# Patient Record
Sex: Female | Born: 1975
Health system: Southern US, Community
[De-identification: ages and names within clinical notes are randomized; demographics above are authoritative.]

## PROBLEM LIST (undated history)

## (undated) DIAGNOSIS — E7800 Pure hypercholesterolemia, unspecified: Secondary | ICD-10-CM

## (undated) DIAGNOSIS — J189 Pneumonia, unspecified organism: Secondary | ICD-10-CM

## (undated) DIAGNOSIS — R002 Palpitations: Secondary | ICD-10-CM

## (undated) DIAGNOSIS — Z8489 Family history of other specified conditions: Secondary | ICD-10-CM

## (undated) DIAGNOSIS — K8020 Calculus of gallbladder without cholecystitis without obstruction: Secondary | ICD-10-CM

## (undated) DIAGNOSIS — R011 Cardiac murmur, unspecified: Secondary | ICD-10-CM

## (undated) DIAGNOSIS — R51 Headache: Secondary | ICD-10-CM

## (undated) DIAGNOSIS — Z87442 Personal history of urinary calculi: Secondary | ICD-10-CM

## (undated) DIAGNOSIS — K219 Gastro-esophageal reflux disease without esophagitis: Secondary | ICD-10-CM

## (undated) DIAGNOSIS — R569 Unspecified convulsions: Secondary | ICD-10-CM

## (undated) DIAGNOSIS — H7092 Unspecified mastoiditis, left ear: Secondary | ICD-10-CM

## (undated) HISTORY — PX: MULTIPLE TOOTH EXTRACTIONS: SHX2053

## (undated) HISTORY — PX: TONSILLECTOMY: SUR1361

---

## 1998-09-27 ENCOUNTER — Other Ambulatory Visit: Admission: RE | Admit: 1998-09-27 | Discharge: 1998-09-27 | Payer: Self-pay | Admitting: *Deleted

## 1998-11-19 ENCOUNTER — Inpatient Hospital Stay (HOSPITAL_COMMUNITY): Admission: AD | Admit: 1998-11-19 | Discharge: 1998-11-19 | Payer: Self-pay | Admitting: Obstetrics & Gynecology

## 1999-02-02 ENCOUNTER — Inpatient Hospital Stay (HOSPITAL_COMMUNITY): Admission: AD | Admit: 1999-02-02 | Discharge: 1999-02-02 | Payer: Self-pay | Admitting: *Deleted

## 1999-02-25 ENCOUNTER — Inpatient Hospital Stay (HOSPITAL_COMMUNITY): Admission: AD | Admit: 1999-02-25 | Discharge: 1999-02-25 | Payer: Self-pay | Admitting: Obstetrics and Gynecology

## 1999-02-27 ENCOUNTER — Inpatient Hospital Stay (HOSPITAL_COMMUNITY): Admission: AD | Admit: 1999-02-27 | Discharge: 1999-02-27 | Payer: Self-pay | Admitting: *Deleted

## 1999-02-27 ENCOUNTER — Encounter: Payer: Self-pay | Admitting: *Deleted

## 1999-03-26 ENCOUNTER — Inpatient Hospital Stay (HOSPITAL_COMMUNITY): Admission: AD | Admit: 1999-03-26 | Discharge: 1999-03-26 | Payer: Self-pay | Admitting: *Deleted

## 1999-04-15 ENCOUNTER — Inpatient Hospital Stay (HOSPITAL_COMMUNITY): Admission: AD | Admit: 1999-04-15 | Discharge: 1999-04-15 | Payer: Self-pay | Admitting: Obstetrics & Gynecology

## 1999-04-19 ENCOUNTER — Inpatient Hospital Stay (HOSPITAL_COMMUNITY): Admission: AD | Admit: 1999-04-19 | Discharge: 1999-04-19 | Payer: Self-pay | Admitting: Obstetrics and Gynecology

## 1999-04-22 ENCOUNTER — Inpatient Hospital Stay (HOSPITAL_COMMUNITY): Admission: AD | Admit: 1999-04-22 | Discharge: 1999-04-22 | Payer: Self-pay | Admitting: Obstetrics and Gynecology

## 1999-04-28 ENCOUNTER — Encounter (INDEPENDENT_AMBULATORY_CARE_PROVIDER_SITE_OTHER): Payer: Self-pay

## 1999-04-28 ENCOUNTER — Inpatient Hospital Stay (HOSPITAL_COMMUNITY): Admission: AD | Admit: 1999-04-28 | Discharge: 1999-04-30 | Payer: Self-pay | Admitting: Obstetrics & Gynecology

## 2002-03-20 ENCOUNTER — Other Ambulatory Visit: Admission: RE | Admit: 2002-03-20 | Discharge: 2002-03-20 | Payer: Self-pay | Admitting: Obstetrics and Gynecology

## 2003-01-25 ENCOUNTER — Inpatient Hospital Stay (HOSPITAL_COMMUNITY): Admission: AD | Admit: 2003-01-25 | Discharge: 2003-01-26 | Payer: Self-pay | Admitting: Obstetrics and Gynecology

## 2003-02-08 ENCOUNTER — Ambulatory Visit (HOSPITAL_COMMUNITY): Admission: RE | Admit: 2003-02-08 | Discharge: 2003-02-08 | Payer: Self-pay | Admitting: Obstetrics and Gynecology

## 2003-02-23 ENCOUNTER — Inpatient Hospital Stay (HOSPITAL_COMMUNITY): Admission: AD | Admit: 2003-02-23 | Discharge: 2003-02-23 | Payer: Self-pay | Admitting: Obstetrics and Gynecology

## 2003-04-11 ENCOUNTER — Inpatient Hospital Stay (HOSPITAL_COMMUNITY): Admission: AD | Admit: 2003-04-11 | Discharge: 2003-04-11 | Payer: Self-pay | Admitting: Obstetrics and Gynecology

## 2003-05-14 ENCOUNTER — Inpatient Hospital Stay (HOSPITAL_COMMUNITY): Admission: AD | Admit: 2003-05-14 | Discharge: 2003-05-14 | Payer: Self-pay | Admitting: Obstetrics and Gynecology

## 2003-05-15 ENCOUNTER — Inpatient Hospital Stay (HOSPITAL_COMMUNITY): Admission: AD | Admit: 2003-05-15 | Discharge: 2003-05-15 | Payer: Self-pay | Admitting: Obstetrics and Gynecology

## 2003-06-10 ENCOUNTER — Inpatient Hospital Stay (HOSPITAL_COMMUNITY): Admission: AD | Admit: 2003-06-10 | Discharge: 2003-06-10 | Payer: Self-pay | Admitting: Obstetrics and Gynecology

## 2003-06-14 ENCOUNTER — Encounter (INDEPENDENT_AMBULATORY_CARE_PROVIDER_SITE_OTHER): Payer: Self-pay | Admitting: Specialist

## 2003-06-14 ENCOUNTER — Inpatient Hospital Stay (HOSPITAL_COMMUNITY): Admission: AD | Admit: 2003-06-14 | Discharge: 2003-06-16 | Payer: Self-pay | Admitting: Obstetrics and Gynecology

## 2003-07-20 ENCOUNTER — Inpatient Hospital Stay (HOSPITAL_COMMUNITY): Admission: AD | Admit: 2003-07-20 | Discharge: 2003-07-20 | Payer: Self-pay | Admitting: Obstetrics and Gynecology

## 2003-07-24 ENCOUNTER — Other Ambulatory Visit: Admission: RE | Admit: 2003-07-24 | Discharge: 2003-07-24 | Payer: Self-pay | Admitting: Obstetrics and Gynecology

## 2004-01-28 ENCOUNTER — Emergency Department (HOSPITAL_COMMUNITY): Admission: EM | Admit: 2004-01-28 | Discharge: 2004-01-29 | Payer: Self-pay | Admitting: Emergency Medicine

## 2004-03-25 ENCOUNTER — Ambulatory Visit (HOSPITAL_COMMUNITY): Admission: RE | Admit: 2004-03-25 | Discharge: 2004-03-25 | Payer: Self-pay | Admitting: Neurology

## 2004-04-07 ENCOUNTER — Ambulatory Visit (HOSPITAL_COMMUNITY): Admission: RE | Admit: 2004-04-07 | Discharge: 2004-04-07 | Payer: Self-pay | Admitting: Neurology

## 2005-03-07 ENCOUNTER — Inpatient Hospital Stay (HOSPITAL_COMMUNITY): Admission: AD | Admit: 2005-03-07 | Discharge: 2005-03-07 | Payer: Self-pay | Admitting: Obstetrics & Gynecology

## 2005-12-25 ENCOUNTER — Other Ambulatory Visit: Admission: RE | Admit: 2005-12-25 | Discharge: 2005-12-25 | Payer: Self-pay | Admitting: Obstetrics and Gynecology

## 2006-06-18 ENCOUNTER — Inpatient Hospital Stay (HOSPITAL_COMMUNITY): Admission: AD | Admit: 2006-06-18 | Discharge: 2006-06-18 | Payer: Self-pay | Admitting: Obstetrics and Gynecology

## 2006-06-23 ENCOUNTER — Encounter: Admission: RE | Admit: 2006-06-23 | Discharge: 2006-06-23 | Payer: Self-pay | Admitting: Obstetrics and Gynecology

## 2006-07-28 ENCOUNTER — Inpatient Hospital Stay (HOSPITAL_COMMUNITY): Admission: AD | Admit: 2006-07-28 | Discharge: 2006-07-29 | Payer: Self-pay | Admitting: Obstetrics and Gynecology

## 2006-08-02 ENCOUNTER — Inpatient Hospital Stay (HOSPITAL_COMMUNITY): Admission: AD | Admit: 2006-08-02 | Discharge: 2006-08-02 | Payer: Self-pay | Admitting: Obstetrics and Gynecology

## 2006-08-24 ENCOUNTER — Inpatient Hospital Stay (HOSPITAL_COMMUNITY): Admission: AD | Admit: 2006-08-24 | Discharge: 2006-08-24 | Payer: Self-pay | Admitting: Obstetrics and Gynecology

## 2006-09-02 ENCOUNTER — Inpatient Hospital Stay (HOSPITAL_COMMUNITY): Admission: AD | Admit: 2006-09-02 | Discharge: 2006-09-02 | Payer: Self-pay | Admitting: Obstetrics and Gynecology

## 2006-09-17 ENCOUNTER — Inpatient Hospital Stay (HOSPITAL_COMMUNITY): Admission: AD | Admit: 2006-09-17 | Discharge: 2006-09-17 | Payer: Self-pay | Admitting: Obstetrics and Gynecology

## 2006-09-29 ENCOUNTER — Inpatient Hospital Stay (HOSPITAL_COMMUNITY): Admission: AD | Admit: 2006-09-29 | Discharge: 2006-09-29 | Payer: Self-pay | Admitting: Obstetrics and Gynecology

## 2006-10-22 ENCOUNTER — Inpatient Hospital Stay (HOSPITAL_COMMUNITY): Admission: AD | Admit: 2006-10-22 | Discharge: 2006-10-22 | Payer: Self-pay | Admitting: Obstetrics and Gynecology

## 2006-10-24 ENCOUNTER — Inpatient Hospital Stay (HOSPITAL_COMMUNITY): Admission: AD | Admit: 2006-10-24 | Discharge: 2006-10-25 | Payer: Self-pay | Admitting: Obstetrics and Gynecology

## 2006-10-25 ENCOUNTER — Inpatient Hospital Stay (HOSPITAL_COMMUNITY): Admission: AD | Admit: 2006-10-25 | Discharge: 2006-10-25 | Payer: Self-pay | Admitting: Obstetrics and Gynecology

## 2006-10-29 ENCOUNTER — Inpatient Hospital Stay (HOSPITAL_COMMUNITY): Admission: RE | Admit: 2006-10-29 | Discharge: 2006-10-31 | Payer: Self-pay | Admitting: Obstetrics and Gynecology

## 2007-08-06 ENCOUNTER — Emergency Department (HOSPITAL_COMMUNITY): Admission: EM | Admit: 2007-08-06 | Discharge: 2007-08-07 | Payer: Self-pay | Admitting: Emergency Medicine

## 2007-12-19 ENCOUNTER — Ambulatory Visit (HOSPITAL_COMMUNITY): Admission: RE | Admit: 2007-12-19 | Discharge: 2007-12-19 | Payer: Self-pay | Admitting: Family Medicine

## 2007-12-19 ENCOUNTER — Encounter: Payer: Self-pay | Admitting: Family Medicine

## 2007-12-19 ENCOUNTER — Ambulatory Visit: Payer: Self-pay | Admitting: Family Medicine

## 2007-12-19 DIAGNOSIS — F410 Panic disorder [episodic paroxysmal anxiety] without agoraphobia: Secondary | ICD-10-CM | POA: Insufficient documentation

## 2007-12-19 DIAGNOSIS — L509 Urticaria, unspecified: Secondary | ICD-10-CM

## 2007-12-19 DIAGNOSIS — R569 Unspecified convulsions: Secondary | ICD-10-CM | POA: Insufficient documentation

## 2007-12-19 LAB — CONVERTED CEMR LAB
AST: 9 units/L (ref 0–37)
Albumin: 4.1 g/dL (ref 3.5–5.2)
Alkaline Phosphatase: 84 units/L (ref 39–117)
Chloride: 105 meq/L (ref 96–112)
Glucose, Bld: 83 mg/dL (ref 70–99)
Hemoglobin: 13.4 g/dL (ref 12.0–15.0)
MCV: 82.3 fL (ref 78.0–100.0)
Platelets: 351 10*3/uL (ref 150–400)
Sodium: 141 meq/L (ref 135–145)
Total Protein: 7 g/dL (ref 6.0–8.3)
WBC: 15.7 10*3/uL — ABNORMAL HIGH (ref 4.0–10.5)

## 2007-12-20 DIAGNOSIS — K219 Gastro-esophageal reflux disease without esophagitis: Secondary | ICD-10-CM

## 2007-12-21 ENCOUNTER — Encounter: Payer: Self-pay | Admitting: Family Medicine

## 2008-01-27 ENCOUNTER — Ambulatory Visit: Payer: Self-pay | Admitting: Family Medicine

## 2008-01-27 DIAGNOSIS — E669 Obesity, unspecified: Secondary | ICD-10-CM | POA: Insufficient documentation

## 2008-02-28 ENCOUNTER — Ambulatory Visit: Payer: Self-pay | Admitting: Family Medicine

## 2008-02-28 ENCOUNTER — Encounter: Payer: Self-pay | Admitting: Family Medicine

## 2008-02-28 ENCOUNTER — Encounter (INDEPENDENT_AMBULATORY_CARE_PROVIDER_SITE_OTHER): Payer: Self-pay | Admitting: Family Medicine

## 2008-02-28 LAB — CONVERTED CEMR LAB: GC Probe Amp, Genital: NEGATIVE

## 2008-03-02 ENCOUNTER — Encounter: Payer: Self-pay | Admitting: Family Medicine

## 2008-03-02 ENCOUNTER — Telehealth: Payer: Self-pay | Admitting: Family Medicine

## 2008-03-30 ENCOUNTER — Encounter: Payer: Self-pay | Admitting: Family Medicine

## 2008-04-09 ENCOUNTER — Ambulatory Visit: Payer: Self-pay | Admitting: Family Medicine

## 2008-05-03 ENCOUNTER — Ambulatory Visit: Payer: Self-pay | Admitting: Family Medicine

## 2008-06-04 ENCOUNTER — Ambulatory Visit: Payer: Self-pay | Admitting: Family Medicine

## 2008-06-04 ENCOUNTER — Encounter: Payer: Self-pay | Admitting: Family Medicine

## 2008-06-05 ENCOUNTER — Encounter: Payer: Self-pay | Admitting: Family Medicine

## 2008-06-05 LAB — CONVERTED CEMR LAB
AST: 14 units/L (ref 0–37)
CO2: 21 meq/L (ref 19–32)
Chloride: 107 meq/L (ref 96–112)
Hemoglobin: 14.1 g/dL (ref 12.0–15.0)
LDL Cholesterol: 114 mg/dL — ABNORMAL HIGH (ref 0–99)
MCHC: 32.9 g/dL (ref 30.0–36.0)
MCV: 84.8 fL (ref 78.0–100.0)
Platelets: 318 10*3/uL (ref 150–400)
Potassium: 4.4 meq/L (ref 3.5–5.3)
RDW: 14.8 % (ref 11.5–15.5)
Sodium: 140 meq/L (ref 135–145)
Total Protein: 7.3 g/dL (ref 6.0–8.3)
Triglycerides: 208 mg/dL — ABNORMAL HIGH (ref <150)
VLDL: 42 mg/dL — ABNORMAL HIGH (ref 0–40)

## 2008-06-08 ENCOUNTER — Telehealth: Payer: Self-pay | Admitting: *Deleted

## 2008-07-02 ENCOUNTER — Ambulatory Visit: Payer: Self-pay | Admitting: Family Medicine

## 2008-07-02 ENCOUNTER — Encounter: Payer: Self-pay | Admitting: Family Medicine

## 2008-07-11 ENCOUNTER — Encounter (INDEPENDENT_AMBULATORY_CARE_PROVIDER_SITE_OTHER): Payer: Self-pay | Admitting: Family Medicine

## 2008-07-11 ENCOUNTER — Ambulatory Visit: Payer: Self-pay | Admitting: Cardiology

## 2008-07-11 ENCOUNTER — Ambulatory Visit (HOSPITAL_COMMUNITY): Admission: RE | Admit: 2008-07-11 | Discharge: 2008-07-11 | Payer: Self-pay | Admitting: Family Medicine

## 2008-07-12 ENCOUNTER — Telehealth: Payer: Self-pay | Admitting: *Deleted

## 2008-07-25 ENCOUNTER — Telehealth: Payer: Self-pay | Admitting: Family Medicine

## 2008-07-25 ENCOUNTER — Ambulatory Visit: Payer: Self-pay | Admitting: Family Medicine

## 2008-07-26 ENCOUNTER — Encounter: Admission: RE | Admit: 2008-07-26 | Discharge: 2008-07-26 | Payer: Self-pay | Admitting: Family Medicine

## 2008-07-26 ENCOUNTER — Ambulatory Visit: Payer: Self-pay | Admitting: Family Medicine

## 2008-07-30 ENCOUNTER — Ambulatory Visit: Payer: Self-pay | Admitting: Family Medicine

## 2008-07-30 DIAGNOSIS — J45909 Unspecified asthma, uncomplicated: Secondary | ICD-10-CM | POA: Insufficient documentation

## 2008-09-26 ENCOUNTER — Telehealth: Payer: Self-pay | Admitting: Family Medicine

## 2008-10-03 ENCOUNTER — Ambulatory Visit: Payer: Self-pay | Admitting: Family Medicine

## 2008-10-05 ENCOUNTER — Telehealth: Payer: Self-pay | Admitting: *Deleted

## 2008-10-17 ENCOUNTER — Ambulatory Visit: Payer: Self-pay | Admitting: Family Medicine

## 2008-10-17 ENCOUNTER — Telehealth: Payer: Self-pay | Admitting: Family Medicine

## 2008-10-21 ENCOUNTER — Emergency Department (HOSPITAL_COMMUNITY): Admission: EM | Admit: 2008-10-21 | Discharge: 2008-10-22 | Payer: Self-pay | Admitting: Emergency Medicine

## 2008-10-23 ENCOUNTER — Encounter: Payer: Self-pay | Admitting: Family Medicine

## 2008-11-03 ENCOUNTER — Encounter (INDEPENDENT_AMBULATORY_CARE_PROVIDER_SITE_OTHER): Payer: Self-pay | Admitting: *Deleted

## 2008-11-13 ENCOUNTER — Encounter: Payer: Self-pay | Admitting: Family Medicine

## 2008-11-13 ENCOUNTER — Ambulatory Visit: Payer: Self-pay | Admitting: Family Medicine

## 2008-11-13 LAB — CONVERTED CEMR LAB
BUN: 7 mg/dL (ref 6–23)
CO2: 24 meq/L (ref 19–32)
Calcium: 9.3 mg/dL (ref 8.4–10.5)
Chloride: 105 meq/L (ref 96–112)
Creatinine, Ser: 0.78 mg/dL (ref 0.40–1.20)
Glucose, Bld: 82 mg/dL (ref 70–99)
Phenytoin Lvl: <0.5 ug/mL — ABNORMAL LOW (ref 10.0–20.0)
Sodium: 142 meq/L (ref 135–145)

## 2008-11-14 ENCOUNTER — Encounter: Payer: Self-pay | Admitting: Family Medicine

## 2008-11-15 ENCOUNTER — Telehealth: Payer: Self-pay | Admitting: Family Medicine

## 2008-11-30 ENCOUNTER — Telehealth: Payer: Self-pay | Admitting: Family Medicine

## 2008-11-30 ENCOUNTER — Ambulatory Visit: Payer: Self-pay | Admitting: Family Medicine

## 2008-11-30 ENCOUNTER — Encounter: Payer: Self-pay | Admitting: Family Medicine

## 2008-12-04 ENCOUNTER — Encounter: Payer: Self-pay | Admitting: Family Medicine

## 2008-12-04 ENCOUNTER — Ambulatory Visit: Payer: Self-pay | Admitting: Family Medicine

## 2008-12-05 ENCOUNTER — Telehealth: Payer: Self-pay | Admitting: Sports Medicine

## 2008-12-06 ENCOUNTER — Telehealth: Payer: Self-pay | Admitting: Family Medicine

## 2008-12-10 ENCOUNTER — Encounter: Payer: Self-pay | Admitting: Family Medicine

## 2008-12-11 ENCOUNTER — Encounter: Payer: Self-pay | Admitting: Family Medicine

## 2008-12-26 ENCOUNTER — Ambulatory Visit: Payer: Self-pay | Admitting: Family Medicine

## 2008-12-31 ENCOUNTER — Telehealth: Payer: Self-pay | Admitting: *Deleted

## 2009-01-17 ENCOUNTER — Telehealth: Payer: Self-pay | Admitting: Family Medicine

## 2009-01-23 ENCOUNTER — Telehealth (INDEPENDENT_AMBULATORY_CARE_PROVIDER_SITE_OTHER): Payer: Self-pay | Admitting: *Deleted

## 2009-02-18 ENCOUNTER — Ambulatory Visit: Payer: Self-pay | Admitting: Family Medicine

## 2009-02-18 ENCOUNTER — Telehealth: Payer: Self-pay | Admitting: *Deleted

## 2009-02-20 ENCOUNTER — Emergency Department (HOSPITAL_COMMUNITY): Admission: EM | Admit: 2009-02-20 | Discharge: 2009-02-20 | Payer: Self-pay | Admitting: Internal Medicine

## 2009-03-04 ENCOUNTER — Telehealth: Payer: Self-pay | Admitting: *Deleted

## 2009-03-12 ENCOUNTER — Inpatient Hospital Stay (HOSPITAL_COMMUNITY): Admission: AD | Admit: 2009-03-12 | Discharge: 2009-03-12 | Payer: Self-pay | Admitting: Obstetrics and Gynecology

## 2009-03-26 ENCOUNTER — Telehealth: Payer: Self-pay | Admitting: Family Medicine

## 2009-04-03 ENCOUNTER — Telehealth: Payer: Self-pay | Admitting: Family Medicine

## 2009-04-22 ENCOUNTER — Telehealth: Payer: Self-pay | Admitting: Family Medicine

## 2009-05-09 ENCOUNTER — Encounter: Payer: Self-pay | Admitting: Family Medicine

## 2009-05-09 ENCOUNTER — Ambulatory Visit: Payer: Self-pay | Admitting: Family Medicine

## 2009-05-09 DIAGNOSIS — N920 Excessive and frequent menstruation with regular cycle: Secondary | ICD-10-CM | POA: Insufficient documentation

## 2009-05-09 DIAGNOSIS — F341 Dysthymic disorder: Secondary | ICD-10-CM | POA: Insufficient documentation

## 2009-05-09 DIAGNOSIS — G5600 Carpal tunnel syndrome, unspecified upper limb: Secondary | ICD-10-CM | POA: Insufficient documentation

## 2009-05-10 LAB — CONVERTED CEMR LAB
ALT: 16 units/L (ref 0–35)
AST: 17 units/L (ref 0–37)
Alkaline Phosphatase: 82 units/L (ref 39–117)
BUN: 8 mg/dL (ref 6–23)
CO2: 24 meq/L (ref 19–32)
Calcium: 10.1 mg/dL (ref 8.4–10.5)
Chloride: 101 meq/L (ref 96–112)
Creatinine, Ser: 0.78 mg/dL (ref 0.40–1.20)
Glucose, Bld: 119 mg/dL — ABNORMAL HIGH (ref 70–99)
HCT: 43.6 % (ref 36.0–46.0)
RBC: 5.16 M/uL — ABNORMAL HIGH (ref 3.87–5.11)
Total Bilirubin: 0.2 mg/dL — ABNORMAL LOW (ref 0.3–1.2)
Total Protein: 7.4 g/dL (ref 6.0–8.3)

## 2009-05-13 ENCOUNTER — Encounter: Payer: Self-pay | Admitting: *Deleted

## 2009-05-29 ENCOUNTER — Ambulatory Visit: Payer: Self-pay | Admitting: Family Medicine

## 2009-05-29 DIAGNOSIS — E739 Lactose intolerance, unspecified: Secondary | ICD-10-CM | POA: Insufficient documentation

## 2009-05-29 DIAGNOSIS — Z720 Tobacco use: Secondary | ICD-10-CM

## 2009-05-29 DIAGNOSIS — H60399 Other infective otitis externa, unspecified ear: Secondary | ICD-10-CM | POA: Insufficient documentation

## 2009-05-30 ENCOUNTER — Telehealth: Payer: Self-pay | Admitting: Family Medicine

## 2009-05-31 ENCOUNTER — Encounter: Payer: Self-pay | Admitting: Family Medicine

## 2009-06-04 ENCOUNTER — Encounter: Admission: RE | Admit: 2009-06-04 | Discharge: 2009-06-04 | Payer: Self-pay | Admitting: Family Medicine

## 2009-06-04 ENCOUNTER — Ambulatory Visit: Payer: Self-pay | Admitting: Family Medicine

## 2009-06-11 ENCOUNTER — Ambulatory Visit (HOSPITAL_COMMUNITY): Admission: RE | Admit: 2009-06-11 | Discharge: 2009-06-11 | Payer: Self-pay | Admitting: Family Medicine

## 2009-06-12 ENCOUNTER — Encounter: Payer: Self-pay | Admitting: Family Medicine

## 2009-06-14 ENCOUNTER — Ambulatory Visit: Payer: Self-pay | Admitting: Family Medicine

## 2009-06-19 ENCOUNTER — Encounter: Payer: Self-pay | Admitting: Family Medicine

## 2009-06-30 ENCOUNTER — Telehealth: Payer: Self-pay | Admitting: Family Medicine

## 2009-07-04 ENCOUNTER — Telehealth: Payer: Self-pay | Admitting: Family Medicine

## 2009-07-08 ENCOUNTER — Telehealth: Payer: Self-pay | Admitting: Family Medicine

## 2009-07-09 ENCOUNTER — Telehealth: Payer: Self-pay | Admitting: Family Medicine

## 2009-07-18 ENCOUNTER — Encounter: Payer: Self-pay | Admitting: *Deleted

## 2009-07-24 ENCOUNTER — Telehealth: Payer: Self-pay | Admitting: Family Medicine

## 2009-07-25 ENCOUNTER — Encounter: Payer: Self-pay | Admitting: Family Medicine

## 2009-07-31 ENCOUNTER — Ambulatory Visit: Payer: Self-pay | Admitting: Family Medicine

## 2009-07-31 ENCOUNTER — Encounter: Payer: Self-pay | Admitting: Family Medicine

## 2009-07-31 LAB — CONVERTED CEMR LAB
Albumin: 4.1 g/dL (ref 3.5–5.2)
Alkaline Phosphatase: 87 units/L (ref 39–117)
Creatinine, Ser: 0.73 mg/dL (ref 0.40–1.20)
HCT: 43.2 % (ref 36.0–46.0)
Hemoglobin: 14.1 g/dL (ref 12.0–15.0)
Platelets: 366 10*3/uL (ref 150–400)
Sodium: 138 meq/L (ref 135–145)
Total Bilirubin: 0.3 mg/dL (ref 0.3–1.2)

## 2009-08-13 ENCOUNTER — Telehealth: Payer: Self-pay | Admitting: Family Medicine

## 2009-08-21 ENCOUNTER — Encounter: Payer: Self-pay | Admitting: *Deleted

## 2009-09-04 ENCOUNTER — Ambulatory Visit: Payer: Self-pay | Admitting: Family Medicine

## 2009-09-04 DIAGNOSIS — J309 Allergic rhinitis, unspecified: Secondary | ICD-10-CM | POA: Insufficient documentation

## 2009-09-04 DIAGNOSIS — G4700 Insomnia, unspecified: Secondary | ICD-10-CM | POA: Insufficient documentation

## 2009-09-08 ENCOUNTER — Encounter: Payer: Self-pay | Admitting: Family Medicine

## 2009-11-07 ENCOUNTER — Encounter: Payer: Self-pay | Admitting: Family Medicine

## 2009-11-21 ENCOUNTER — Encounter: Payer: Self-pay | Admitting: *Deleted

## 2009-11-22 ENCOUNTER — Ambulatory Visit: Payer: Self-pay | Admitting: Family Medicine

## 2009-11-26 ENCOUNTER — Telehealth: Payer: Self-pay | Admitting: Family Medicine

## 2009-12-03 ENCOUNTER — Encounter: Payer: Self-pay | Admitting: *Deleted

## 2009-12-04 ENCOUNTER — Encounter: Payer: Self-pay | Admitting: *Deleted

## 2009-12-05 ENCOUNTER — Encounter: Payer: Self-pay | Admitting: *Deleted

## 2010-02-18 ENCOUNTER — Encounter
Admission: RE | Admit: 2010-02-18 | Discharge: 2010-02-18 | Payer: Self-pay | Source: Home / Self Care | Attending: Family Medicine | Admitting: Family Medicine

## 2010-03-18 NOTE — Miscellaneous (Signed)
Summary: no show appt  Clinical Lists Changes Ms. Koltz was scheduled and appt on 10/7 for 10/18 @ 10:00.  She called yesterday to confirm her appt for her and her husband.  She had her neice call today to inquire about her appt at 10:30.  Informed her neice I would have to speak with her.  Told patient her appt was at 10:00.  She said her card said 1:30.  Informed her that before I could reschedule her next appt I had to confer with Asst Dir.  She said that Dr. Gomez Cleverly told her that as long as she called the day of the appt she would not be considered a NO Show.  In told her that was not correct.  Saw in patient's chart that she was on suspension from 7/11 after hanging up with patient. Abundio Miu  December 03, 2009 10:43 AM      I very clearly told Ms. Offner that if she missed 1 more appointment she would be fired.  My understanding of a cancelled appointment is an appointment that is cancelled at least 24 hours prior to the appt time.  This is what I tell my patients and what I told Ms. Erling Conte.   If she cancelled her appt this morning and her appointment was this morning, I consider that a 'no show'. Maybe Olegario Messier can clarify?  Alvia Grove  The missed appt on 10/18 does constitute a DNKA.  Accoring to the Suspension letter she received on 08/21/09 she will be dismissed from this practice.  She has had a total of 10 no shows since January of this year and has received fair warning about this pattern.  Letter will be mailed today.  Dennison Nancy RN  December 04, 2009 2:38 PM

## 2010-03-18 NOTE — Assessment & Plan Note (Signed)
Summary: Chest cold and stomach virus/KF   Vital Signs:  Patient profile:   35 year old female Height:      63 inches Weight:      223 pounds BMI:     39.65 BSA:     2.03 O2 Sat:      98 % on Room air Temp:     97.7 degrees F Pulse rate:   65 / minute BP sitting:   125 / 80  Vitals Entered By: Jone Baseman CMA (February 18, 2009 4:21 PM)  O2 Flow:  Room air CC: cold S&S x 4 days Is Patient Diabetic? No Pain Assessment Patient in pain? yes     Location: all over Intensity: 10 Type: achy   Primary Care Provider:  Ancil Boozer  MD  CC:  cold S&S x 4 days.  History of Present Illness: URI Symptoms Onset: 4 days agao Description: cough fever achy all over runny nose Modifying factors: no better with over the counter cough medicine   Symptoms Nasal discharge: Y Fever: mild Sore throat: Y Cough: Y Wheezing: N Ear pain: N GI symptoms: vomited early AM and a few times yesterday. last urine was a few hours ago.  No significant abdominal pain  Sick contacts: Y  Red Flags  Stiff neck: N Dyspnea: N Rash: N Swallowing difficulty: N  Sinusitis Risk Factors Headache/face pain: mild Double sickening: N tooth pain: N:   Flu Risk Factors Headache: Y muscle aches: Y severe fatigue: moderate  ROS - as above PMH - Medications reviewed and updated in medication list.  Smoking Status noted in VS form    Current Medications (verified): 1)  Omeprazole 40 Mg Cpdr (Omeprazole) .Marland Kitchen.. 1 By Mouth Two Times A Day For Reflux 2)  Hydrocodone-Acetaminophen 10-325 Mg Tabs (Hydrocodone-Acetaminophen) .Marland Kitchen.. 1 By Mouth Q6hr As Needed Severe Pain.  Do Not Fill Until 02/03/09 3)  Alprazolam 1 Mg Tabs (Alprazolam) .Marland Kitchen.. 1 By Mouth Three Times A Day As Needed For Anxiety.  Do Not Fill Until 01/04/09 4)  Dilantin 100 Mg Caps (Phenytoin Sodium Extended) .... Unknown Dose Per Wfu - She Thinks 800 Mg Daily. 5)  Hydroxyzine Hcl 10 Mg/32ml Syrp (Hydroxyzine Hcl) .... Take 2.5 Tsp By Mouth 4x  Daily As Needed.  Disp Disp 250cc 6)  Ventolin Hfa 108 (90 Base) Mcg/act Aers (Albuterol Sulfate) .... 2 Puffs Q4-6 Hrs As Needed 7)  Flonase 50 Mcg/act Susp (Fluticasone Propionate) .... 2 Sprays Each Nostril Daily For Sinus Congestion 8)  Flovent Hfa 110 Mcg/act Aero (Fluticasone Propionate  Hfa) .Marland Kitchen.. 1 Puff Two Times A Day For Asthma Control 9)  Naproxen 500 Mg Tabs (Naproxen) .Marland Kitchen.. 1 By Mouth Two Times A Day As Needed Back Pain 10)  Keppra 500 Mg Tabs (Levetiracetam) .Marland Kitchen.. 1 By Mouth Two Times A Day For Seizures 11)  Hydrocodone-Acetaminophen 10-325 Mg Tabs (Hydrocodone-Acetaminophen) .Marland Kitchen.. 1 By Mouth Q6hrs As Needed Severe Pain. Do Not Fill Until 01/04/09 12)  Alprazolam 1 Mg Tabs (Alprazolam) .Marland Kitchen.. 1 By Mouth Three Times A Day As Needed Anxiety.  Do Not Fill Until 02/03/09 13)  Promethazine Hcl 25 Mg Tabs (Promethazine Hcl) .Marland Kitchen.. 1 By Mouth Three Times A Day As Needed For Nausea  Allergies: No Known Drug Allergies  Physical Exam  General:  awake alert no acute distress mildy ill Ears:  clear Nose:  turbinates swollen clear rhinnorhea Mouth:  Oral mucosa and oropharynx without lesions or exudates. MM moist Neck:  No deformities, masses, or tenderness noted. Lungs:  Normal respiratory effort, chest expands symmetrically. Lungs are clear to auscultation, no crackles or wheezes. Heart:  Normal rate and regular rhythm. S1 and S2 normal without gallop, murmur, click, rub or other extra sounds. Abdomen:  Bowel sounds positive,abdomen soft and non-tender without masses, organomegaly or hernias noted. obese Msk:  No deformity or scoliosis noted of thoracic or lumbar spine.   Skin:  Intact without suspicious lesions or rashes   Impression & Recommendations:  Problem # 1:  URI (ICD-465.9) Assessment New  no signs of bacterial infection (PNA or urine or sinusitis)  Likely viral.  Will treat with phenergan for nausea.    Her updated medication list for this problem includes:    Promethazine  Hcl 25 Mg Tabs (Promethazine hcl) .Marland Kitchen... 1 by mouth three times a day as needed for nausea  Orders: [facility]- Est Level  3 (99213)  Complete Medication List: 1)  Omeprazole 40 Mg Cpdr (Omeprazole) .Marland Kitchen.. 1 by mouth two times a day for reflux 2)  Hydrocodone-acetaminophen 10-325 Mg Tabs (Hydrocodone-acetaminophen) .Marland Kitchen.. 1 by mouth q6hr as needed severe pain.  do not fill until 02/03/09 3)  Alprazolam 1 Mg Tabs (Alprazolam) .Marland Kitchen.. 1 by mouth three times a day as needed for anxiety.  do not fill until 01/04/09 4)  Dilantin 100 Mg Caps (Phenytoin sodium extended) .... Unknown dose per wfu - she thinks 800 mg daily. 5)  Hydroxyzine Hcl 10 Mg/70ml Syrp (Hydroxyzine hcl) .... Take 2.5 tsp by mouth 4x daily as needed.  disp disp 250cc 6)  Ventolin Hfa 108 (90 Base) Mcg/act Aers (Albuterol sulfate) .... 2 puffs q4-6 hrs as needed 7)  Flonase 50 Mcg/act Susp (Fluticasone propionate) .... 2 sprays each nostril daily for sinus congestion 8)  Flovent Hfa 110 Mcg/act Aero (Fluticasone propionate  hfa) .Marland Kitchen.. 1 puff two times a day for asthma control 9)  Naproxen 500 Mg Tabs (Naproxen) .Marland Kitchen.. 1 by mouth two times a day as needed back pain 10)  Keppra 500 Mg Tabs (Levetiracetam) .Marland Kitchen.. 1 by mouth two times a day for seizures 11)  Hydrocodone-acetaminophen 10-325 Mg Tabs (Hydrocodone-acetaminophen) .Marland Kitchen.. 1 by mouth q6hrs as needed severe pain. do not fill until 01/04/09 12)  Alprazolam 1 Mg Tabs (Alprazolam) .Marland Kitchen.. 1 by mouth three times a day as needed anxiety.  do not fill until 02/03/09 13)  Promethazine Hcl 25 Mg Tabs (Promethazine hcl) .Marland Kitchen.. 1 by mouth three times a day as needed for nausea  Other Orders: Pulse Oximetry- [facility] [id number])  Patient Instructions: 1)  follow up with Dr Sandi Mealy as needed 2)  Use the phernegan as needed for vomiting 3)  Drink small sips of Gatorade or Ginger ale every 5-10 min 4)  Get Afrin nasal spray two times a day for the next 3 days only  5)  If you are not better in one week or if the fevers get  higher than 102 or if you are not urinating for 12 hours then call us Prescriptions: PROMETHAZINE HCL 25 MG TABS (PROMETHAZINE HCL) 1 by mouth three times a day as needed for nausea  #15 x 0   Entered and Authorized by:   Pearlean Brownie MD   Signed by:   Pearlean Brownie MD on 02/18/2009   Method used:   [facility] to        [facility]  [address] [id number]       [address]       [place], [place]  [address]  Ph: [phone] or [phone]       Fax: [phone]   RxID:   [id number]

## 2010-03-18 NOTE — Consult Note (Signed)
Summary: [facility]  [facility]   Imported By: Clydell Hakim 11/13/2009 10:52:59  _____________________________________________________________________  External Attachment:    Type:   Image     Comment:   External Document

## 2010-03-18 NOTE — Progress Notes (Signed)
Summary: referral  Phone Note Call from Patient Call back at [phone]   Caller: Patient Summary of Call: wants to go to derm and needs referral go to this one - [facility].  Initial call taken by: De Nurse,  April 03, 2009 2:36 PM  Follow-up for Phone Call        to Dr. Meredith Leeds covering md Follow-up by: Golden Circle RN,  April 03, 2009 4:26 PM  Additional Follow-up for Phone Call Additional follow up Details #1::        Unclear what referral is for. Needs to be seen for this to be done. Additional Follow-up by: Romero Belling MD,  April 03, 2009 5:42 PM    Additional Follow-up for Phone Call Additional follow up Details #2::    for cyst on eye (original referral 11/10) Follow-up by: De Nurse,  April 04, 2009 8:32 AM  Additional Follow-up for Phone Call Additional follow up Details #3:: Details for Additional Follow-up Action Taken: I've reviewed the notes, and we have made the appointment for her.  She needs to contact the dermatologist if she needs to modify the referral. Additional Follow-up by: Romero Belling MD,  April 04, 2009 9:39 AM  appointment scheduled with Dr. Leonie Man of [facility] for 03/3 02/2009 at 8:50 AM. appempted to call patient. message left to return call. Theresia Lo RN  April 04, 2009 2:07 PM

## 2010-03-18 NOTE — Progress Notes (Signed)
Summary: Rx Req  Phone Note Refill Request Call back at Home Phone [phone] Message from:  Patient  Refills Requested: Medication #1:  HYDROCODONE-ACETAMINOPHEN 10-325 MG TABS 1 by mouth q6hrs as needed severe pain. Do not fill until 01/04/09 [facility] [address]  Initial call taken by: Clydell Hakim,  Jul 09, 2009 2:06 PM  Follow-up for Phone Call        no, needs appt to discuss this. doesn't need refill until 07/29/09 [facility] Follow-up by: Ancil Boozer  MD,  Jul 09, 2009 2:29 PM  Additional Follow-up for Phone Call Additional follow up Details #1::        Informed patient of above, she states that she got last rx for #60 with directions to take it three times a day so now shes due, adamant that she is not abusing med. Would like to speak with MD.  Additional Follow-up by: Garen Grams LPN,  Jul 09, 2009 4:57 PM    Additional Follow-up for Phone Call Additional follow up Details #2::    patient is on pain contract as of 07/02/08  patient states that seizures have returned and she has taken another fall and this is why she is using more of her pain medication than typical.  advised her that this will be last fill until she has follow up with me to discuss frequent falls and return of seizures at next visit needs uds, dilantin level.  i am at least somewhat concerned her husband may be taking her pain medication and this is why she is running out earlier than is typical for her.  Follow-up by: Ancil Boozer  MD,  Jul 10, 2009 2:29 PM  New/Updated Medications: HYDROCODONE-ACETAMINOPHEN 10-325 MG TABS (HYDROCODONE-ACETAMINOPHEN) 1 by mouth q6hrs as needed severe pain.  okay to fill on 07/10/09 Prescriptions: HYDROCODONE-ACETAMINOPHEN 10-325 MG TABS (HYDROCODONE-ACETAMINOPHEN) 1 by mouth q6hrs as needed severe pain.  okay to fill on 07/10/09  #60 x 0   Entered and Authorized by:   Ancil Boozer  MD   Signed by:   Ancil Boozer  MD on 07/10/2009   Method  used:   Handwritten   RxID:   [id number]

## 2010-03-18 NOTE — Progress Notes (Signed)
Summary: refill  Phone Note Refill Request Call back at Home Phone [phone] Message from:  Patient  Refills Requested: Medication #1:  OMEPRAZOLE 40 MG CPDR 1 by mouth two times a day for reflux also needs a DM kit and insurance will pay for it - [facility] Meter [facility] Strips [facility] Lancets -  [facility]  Initial call taken by: De Nurse,  May 30, 2009 12:26 PM  Follow-up for Phone Call        she did not test positive for diabetes so she doesn't need to test her sugars at this time.  the omeprazole script was given to her yesterday at her appt. Follow-up by: Ancil Boozer  MD,  May 30, 2009 1:54 PM  Additional Follow-up for Phone Call Additional follow up Details #1::        notified. Additional Follow-up by: Theresia Lo RN,  May 30, 2009 4:19 PM

## 2010-03-18 NOTE — Letter (Signed)
Summary: Termination of Care Letter  [facility]  [address]   [place], [place] [address]   Phone: [phone]  Fax: [phone]    12/04/2009 MRN: [id number]  Dear Ms. Loughridge,  Please be advised that the [facility] will no longer be your healthcare [facility].  Accordingly, all further requests for appointments and office visits will be declined.  As such, you are not allowed on the premises of the [facility].  Should you have another physician who will take over your care, please advise [place] and we will forward a copy of your medical records.  If you do not know of another physician, we will assist you with a referral.  The termination of your relationship with this office is effective immediately.  In the next 30 days if you find that you have a need for medical care we will refer you to another [facility] for that care.  Thank you for your cooperation.  Sincerely,  [facility]

## 2010-03-18 NOTE — Progress Notes (Signed)
Summary: resutls  Phone Note Call from Patient Call back at Home Phone [phone]   Caller: Patient Summary of Call: pt is wanting results of labs Initial call taken by: De Nurse,  August 13, 2009 4:07 PM  Follow-up for Phone Call        LM Follow-up by: Golden Circle RN,  August 13, 2009 4:07 PM  Additional Follow-up for Phone Call Additional follow up Details #1::        told her wbc slightly elevated.told her usually indication of fighting an infection. she has a sore throat. appt made for 8:30 tomorrow as she has to bring her son in then for labs told her dilantin levels are low. told her i will send to md & call her if they want her to do anything different. taking the dilantin & keppra as ordered. may discuss at visit in am Additional Follow-up by: Golden Circle RN,  August 13, 2009 4:10 PM    Additional Follow-up for Phone Call Additional follow up Details #2::    i am not convinced she is taking dilantin as written. i am not concerned about slightly elevated WBC at this time.   regardless, nothing new to do at this time.  thanks. Follow-up by: Ancil Boozer  MD,  August 13, 2009 5:47 PM

## 2010-03-18 NOTE — Progress Notes (Signed)
Summary: ? Bronchitis vs Asthma exacerbation?  Pt called from: [phone] Feels like she is developing bronchitis, using Albuterol about 8 times a day, throat sore thinks it from coughing, taking cough drops, fever off and on in early am and night. Ran out of Qvar, using flonase. I suggested she go to the Urgent Care to be seen since I can not tell over the phone if this is an asthma exacerbation or bronchitis. Pt agrees. Jamie Brookes MD  Jun 30, 2009 10:38 AM

## 2010-03-18 NOTE — Miscellaneous (Signed)
Summary: refill request  received refill request for patient's alprazolam except under different name - Lisa Simpson.  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  She has received a refill already on 05/29/09 with 3 additional refills.  this one was denied and sent back to pharmacy with this information on it. Ancil Boozer  MD  Jun 19, 2009 1:20 PM  Clinical Lists Changes

## 2010-03-18 NOTE — Progress Notes (Signed)
Summary: refill  Phone Note Refill Request Call back at Home Phone [phone] Message from:  Patient  Refills Requested: Medication #1:  ALPRAZOLAM 1 MG TABS 1 by mouth three times a day as needed for anxiety.  do not fill until 01/04/09  Medication #2:  OMEPRAZOLE 40 MG CPDR 1 by mouth two times a day for reflux  Medication #3:  HYDROCODONE-ACETAMINOPHEN 10-325 MG TABS 1 by mouth q6hr as needed severe pain.  Do not fill until 02/03/09  Medication #4:  HYDROXYZINE HCL 10 MG/5ML SYRP take 2.5 tsp by mouth 4x daily as needed.  Disp disp 250cc ventolin Dilantin [facility]  Initial call taken by: De Nurse,  March 04, 2009 3:15 PM  Follow-up for Phone Call        to Dr. Constance Goltz who is covering for Dr. Sandi Mealy Follow-up by: Golden Circle RN,  March 04, 2009 3:24 PM  Additional Follow-up for Phone Call Additional follow up Details #1::        Medical record reviewed.  These are regularly filled medicines for this patient.  Will provide 1 month supply while PCP is out, but not for Dilantin as this is prescribed by another physician and dose is not recorded in our chart.  Vicodin and Xanax prescriptions at front desk.  Others sent to pharmacy.  To [facility] Team. Additional Follow-up by: Romero Belling MD,  March 05, 2009 1:28 PM    Additional Follow-up for Phone Call Additional follow up Details #2::    Pt informed of the above. Follow-up by: Jone Baseman CMA,  March 06, 2009 10:53 AM  Prescriptions: VENTOLIN HFA 108 (90 BASE) MCG/ACT AERS (ALBUTEROL SULFATE) 2 puffs q4-6 hrs as needed  #1 x 0   Entered and Authorized by:   Romero Belling MD   Signed by:   Romero Belling MD on 03/05/2009   Method used:   Electronically to        [facility]* (retail)       [address]       [place], [place]  [address]       Ph: [phone]       Fax: [phone]   RxID:   [id number] HYDROXYZINE HCL 10 MG/5ML SYRP (HYDROXYZINE HCL) take 2.5 tsp by mouth 4x daily as needed.  Disp  disp 250cc  #250 x 0   Entered and Authorized by:   Romero Belling MD   Signed by:   Romero Belling MD on 03/05/2009   Method used:   Electronically to        [facility]* (retail)       [address]       [place], [place]  [address]       Ph: [phone]       Fax: [phone]   RxID:   [id number] ALPRAZOLAM 1 MG TABS (ALPRAZOLAM) 1 by mouth three times a day as needed for anxiety.  do not fill until 01/04/09  #90 x 0   Entered and Authorized by:   Romero Belling MD   Signed by:   Romero Belling MD on 03/05/2009   Method used:   Print then Give to Patient   RxID:   [id number] HYDROCODONE-ACETAMINOPHEN 10-325 MG TABS (HYDROCODONE-ACETAMINOPHEN) 1 by mouth q6hr as needed severe pain.  Do not fill until 02/03/09  #60 x 0   Entered and Authorized by:   Romero Belling MD   Signed by:   Romero Belling MD on 03/05/2009   Method used:   Print then  Give to Patient   RxID:   [id number]

## 2010-03-18 NOTE — Assessment & Plan Note (Signed)
Summary: meet new doc,df   Vital Signs:  Patient profile:   35 year old female Height:      63 inches Weight:      213.3 pounds BMI:     37.92 Temp:     97.7 degrees F oral Pulse rate:   80 / minute BP sitting:   120 / 77  (left arm) Cuff size:   regular  Vitals Entered By: Garen Grams LPN (September 04, 2009 3:01 PM) CC: Meet New MD Is Patient Diabetic? No Pain Assessment Patient in pain? no        Primary Care Provider:  Alvia Grove DO  CC:  Meet New MD.  History of Present Illness: 35 yo female here for f/u of multiple medical issues and to meet new MD (My 1st visit with pt) 1. Seizures, hx of. : Very vauge; pt reports having seizures since she was a child and being on meds for a number of years.(compliance always being an issue). Suppose to f/u at [facility] in December. Pt reports most recent seizure in June 2011 while [place] the beach.  Described episode as twitching that started in her hands and that went thru her entire body, lasting less than 2 min.  No bowel or urinary incontience, no tounge biting.  Did feel tired after episode.  Pt describes this as her usual course.  Currently suppose to be taking Keppra and Dilantin; pt openly admits to non-compliance with these meds and admits to taking decreased doses of them when she actually does take them. She feels these meds make her gain weight and make her tired. Pt requests different medicine for seizure control or no medicine at all.  Also currently takes vicodin for pain that she feels is caused after her seizures.  2. Obesity: Main concern for pt today.  Again attributes seizure meds as cause for weight gain, thus her non-compliance.  Endorses exercise 3-4 times a week for about 1 hour at a time.  Eats 5-6 small meals a day.  Tried loosing weight with no success.  Cites hx of taking prescribed med for weight loss with good results.  Wondering if she could try this again. 3. Insomnia: Takes xanax for sleep.  Has difficulty  falling asleep, not staying asleep 4. Allergies: c/o itching eyes, scratchy thorat and congestion.  Endorses hx of allergies that has responded, in the past to claritin.  Habits & Providers  Alcohol-Tobacco-Diet     Alcohol drinks/day: 0     Tobacco Status: current     Tobacco Counseling: to quit use of tobacco products  Exercise-Depression-Behavior     Does Patient Exercise: yes     Exercise Counseling: to improve exercise regimen     Exercise (avg: min/session): 30-60     Times/week: 3  Current Problems (verified): 1)  Uri  (ICD-465.9) 2)  Tobacco Abuse  (ICD-305.1) 3)  Otitis Externa  (ICD-380.10) 4)  Impaired Glucose Tolerance  (ICD-271.3) 5)  Anxiety Depression  (ICD-300.4) 6)  Carpal Tunnel Syndrome, Bilateral  (ICD-354.0) 7)  Excessive Menstrual Bleeding  (ICD-626.2) 8)  Pelvic Pain  (ICD-789.09) 9)  Seizure Disorder  (ICD-780.39) 10)  Asthma, Persistent, Mild  (ICD-493.90) 11)  Heart Murmur, Systolic  (ICD-785.2) 12)  Chronic Pain Syndrome  (ICD-338.4) 13)  Back Pain  (ICD-724.5) 14)  Obesity, Unspecified  (ICD-278.00) 15)  Urticaria, Chronic  (ICD-708.8) 16)  Gerd  (ICD-530.81) 17)  Panic Attack  (ICD-300.01) 18)  Palpitations  (ICD-785.1)  Current Medications (verified):  1)  Omeprazole 20 Mg Cpdr (Omeprazole) .Marland Kitchen.. 1 By Mouth Two Times A Day For Reflux 2)  Dilantin 100 Mg Caps (Phenytoin Sodium Extended) .... 2 By Mouth Two Times A Day For Seizures. 3)  Hydroxyzine Hcl 10 Mg/47ml Syrp (Hydroxyzine Hcl) .... Take 2.5 Tsp By Mouth 4x Daily As Needed.  Disp Disp 250cc 4)  Ventolin Hfa 108 (90 Base) Mcg/act Aers (Albuterol Sulfate) .... 2 Puffs Q4-6 Hrs As Needed 5)  Qvar 80 Mcg/act Aers (Beclomethasone Dipropionate) .... 2 Puffs Two Times A Day For Asthma Maintenance 6)  Keppra 500 Mg Tabs (Levetiracetam) .Marland Kitchen.. 1 By Mouth Two Times A Day For Seizures 7)  Metformin Hcl 500 Mg Tabs (Metformin Hcl) .Marland Kitchen.. 1 By Mouth Two Times A Day 8)  Cortisporin 3.5-10000-1 Soln  (Neomycin-Polymyxin-Hc) .... 4 Dropps in Each Ear Three Times A Day For 10 Days.  Disp 1 Bottle. 9)  Topamax 25 Mg Tabs (Topiramate) .... Take 25mg  By Mouth Two Times A Day X 1 Week, Then Increase To 50mg  Two Times A Day X 1 Week, Then Increase To 100mg  By Mouth Two Times A Day 10)  Loratadine 10 Mg Tabs (Loratadine) .... Take 1 Pill By Mouth Daily For Allergies  Allergies (verified): No Known Drug Allergies  Past History:  Past Medical History: Last updated: 07/25/2009 GERD Seizure disorder - has been seen at [facility] and records in chart.  they are unsure if true seizure d/o. often reports severe pain after seizures requiring narcotics.  currently trying to get back in with [facility] neurology but reportely has an outstanding bill so seizure d/o has been managed at [facility] obesity mild asthma chronic pain - back.  on controlled substances contract chronic urticaria  anxiety/panic attacks requiring benzos tobacco abuse poor dentition IMPAIRED GLUCOSE TOLERANCE (ICD-271.3) CARPAL TUNNEL SYNDROME, BILATERAL (ICD-354.0) EXCESSIVE MENSTRUAL BLEEDING (ICD-626.2) HEART MURMUR, SYSTOLIC (ICD-785.2)  Past Surgical History: Last updated: 02/28/2008 planned tooth extraction 03/2008  Family History: Last updated: 07/25/2009 Mother (elizabeth Feltner)  father (eric Hainer) sister Efraim Kaufmann Stanley) brother (michael Whitwell) child with autism spectrum disorder (see individual charts in EMR)  Social History: Last updated: 07/25/2009 Works in a Education officer, 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 Smokes 1/2 ppd  Denies ETOH use reports she does exercise Dropped out of high school often moving in and out with multiple family members (see family history for names).  family members often report that Barbarajean is not truthful in her account of her medical problems.  Risk  Factors: Alcohol Use: 0 (09/04/2009) Exercise: yes (09/04/2009)  Risk Factors: Smoking Status: current (09/04/2009) Packs/Day: <0.25 (06/04/2009)  Social History: Smoking Status:  current Does Patient Exercise:  yes  Physical Exam  General:  Vs reviewed, alert, well-developed, well-nourished, and well-hydrated.   Lungs:  Normal respiratory effort, chest expands symmetrically. Lungs are clear to auscultation, no crackles or wheezes. Heart:  Normal rate and regular rhythm. S1 and S2 normal without gallop, murmur, click, rub or other extra sounds. Abdomen:  Bowel sounds positive,abdomen soft and non-tender without masses, organomegaly or hernias noted. obese Psych:  Oriented X3, good eye contact, not anxious appearing, not depressed appearing, not agitated, and not suicidal.     Impression & Recommendations:  Problem # 1:  OBESITY, UNSPECIFIED (ICD-278.00) Assessment Unchanged Multi factorial.  Discussed at legnth today with pt.  Pt feels keppra and dilantin caused weight gain and wants to stop them, we can  do this and transition to Topamax to see if this makes a difference or if it increases her energy.  Pt also agreeable to d/c vicodin and xanax as these certainly aren't aiding in weight loss.  Once her med regimen is stabalized, pt requests weight loss medication.  This MAY be something I am willing to consider for her in the future.   Explained to pt I will require baseline labs, EKG and weekly BP and HR to monitor her if we decide to go this route. Pt will be responsable for keeping all of her appointments, which includes nurse visits for vitals.  Again, discussed at legnth the multiple factors that promote weight loss, mainly diet and exercise.  Medicine MAY enhance this, but it will certainly not be a magic pill.  Pt voices understanding and would like to start with medication change and proceed from there.  Orders: [facility]- Est  Level 4 ([address])  Problem # 2:  SEIZURE DISORDER  (ICD-780.39) Assessment: Unchanged Pt with admitted non-compliance of meds.  Last dilantin level about 4 weeks ago was subtherapudic.  Pt not willing to continue with current meds as she feels they are causing her weight gain.  Discussed in depth risks of not taking meds as prescribed.  At this point these medicines are probably not making much of a difference from a seizure prevention standpoint as pt takes them sporadically and has documented sub-therapudic levels.   Pt has tried topamax before with good results, while this is not my first choice of meds for seizure prevention, this is a medicine that pt is willing to take, which is an improvement from prior regimen.   Will taper off dilantin and keppra and start topamax with goal dose of 200mg  by mouth two times a day.   Her updated medication list for this problem includes:    Dilantin 100 Mg Caps (Phenytoin sodium extended) .Marland Kitchen... 2 by mouth two times a day for seizures.    Keppra 500 Mg Tabs (Levetiracetam) .Marland Kitchen... 1 by mouth two times a day for seizures    Topamax 25 Mg Tabs (Topiramate) .Marland Kitchen... Take 25mg  by mouth two times a day x 1 week, then increase to 50mg  two times a day x 1 week, then increase to 100mg  by mouth two times a day  Orders: [facility]- Est  Level 4 ([address]454)  Problem # 3:  ALLERGIC RHINITIS (ICD-477.9) start loratadine daily The following medications were removed from the medication list:    Flonase 50 Mcg/act Susp (Fluticasone propionate) .Marland Kitchen... 2 sprays each nostril daily for sinus congestion Her updated medication list for this problem includes:    Loratadine 10 Mg Tabs (Loratadine) .Marland Kitchen... Take 1 pill by mouth daily for allergies  Problem # 4:  INSOMNIA (ICD-780.52) per pt report.  Pt states xanax is used for her insomnia not anxiety.  Discussed long term consequences of chronic BZD.  Pt agreeable to taper off xanax.  May consider other sleeping aid in furture, but not likely a bzd.  Complete Medication List: 1)  Omeprazole 20 Mg  Cpdr (Omeprazole) .Marland Kitchen.. 1 by mouth two times a day for reflux 2)  Dilantin 100 Mg Caps (Phenytoin sodium extended) .... 2 by mouth two times a day for seizures. 3)  Hydroxyzine Hcl 10 Mg/45ml Syrp (Hydroxyzine hcl) .... Take 2.5 tsp by mouth 4x daily as needed.  disp disp 250cc 4)  Ventolin Hfa 108 (90 Base) Mcg/act Aers (Albuterol sulfate) .... 2 puffs q4-6 hrs as needed 5)  Qvar 80  Mcg/act Aers (Beclomethasone dipropionate) .... 2 puffs two times a day for asthma maintenance 6)  Keppra 500 Mg Tabs (Levetiracetam) .Marland Kitchen.. 1 by mouth two times a day for seizures 7)  Metformin Hcl 500 Mg Tabs (Metformin hcl) .Marland Kitchen.. 1 by mouth two times a day 8)  Cortisporin 3.5-10000-1 Soln (Neomycin-polymyxin-hc) .... 4 dropps in each ear three times a day for 10 days.  disp 1 bottle. 9)  Topamax 25 Mg Tabs (Topiramate) .... Take 25mg  by mouth two times a day x 1 week, then increase to 50mg  two times a day x 1 week, then increase to 100mg  by mouth two times a day 10)  Loratadine 10 Mg Tabs (Loratadine) .... Take 1 pill by mouth daily for allergies  Patient Instructions: 1)  Please stop your Xanax and your Vicodin 2)  I will call you with a taper dose for your Keppra and Dilantin.   3)  I will call in a script for Topamax 4)  RTC in 3-4 weeks, we will see how your seizures are doing and talk about starting a medicine for weight loss. 5)  Make sure you keep your appt for your next visit Prescriptions: LORATADINE 10 MG TABS (LORATADINE) take 1 pill by mouth daily for allergies  #30 x 3   Entered and Authorized by:   Alvia Grove DO   Signed by:   Alvia Grove DO on 09/08/2009   Method used:   Electronically to        [facility]* (retail)       [address]       [place], [place]  [address]       Ph: [phone]       Fax: [phone]   RxID:   [id number] TOPAMAX 25 MG TABS (TOPIRAMATE) take 25mg  by mouth two times a day x 1 week, then increase to 50mg  two times a day x 1 week, then increase to 100mg   by mouth two times a day  #90 x 0   Entered and Authorized by:   Alvia Grove DO   Signed by:   Alvia Grove DO on 09/08/2009   Method used:   Electronically to        [facility]* (retail)       [address]       [place], [place]  [address]       Ph: [phone]       Fax: [phone]   RxID:   [id number]

## 2010-03-18 NOTE — Progress Notes (Signed)
Summary: Rx Req  Phone Note Refill Request Call back at Home Phone [phone] Message from:  Patient  Refills Requested: Medication #1:  HYDROCODONE-ACETAMINOPHEN 10-325 MG TABS 1 by mouth q6hr as needed severe pain.  Do not fill until 02/03/09  Medication #2:  ALPRAZOLAM 1 MG TABS 1 by mouth three times a day as needed for anxiety.  do not fill until 01/04/09  Medication #3:  VENTOLIN HFA 108 (90 BASE) MCG/ACT AERS 2 puffs q4-6 hrs as needed PT WILL PICK UP RX'S AT FRONT DESK.  PLEASE CALL WHEN READY.  Initial call taken by: Clydell Hakim,  March 26, 2009 1:47 PM  Follow-up for Phone Call        will forward to MD. Follow-up by: Theresia Lo RN,  March 26, 2009 1:55 PM  Additional Follow-up for Phone Call Additional follow up Details #1::        Medical record reviewed.  These are regularly filled medicines for this patient.  Will provide 1 month supply while PCP is out.  These are early fills--Vicodin and Xanax last filled 03/05/2009.  Have written "Do not fill until 04/05/2009" on Vicodin and Xanax.  Rx's at front desk. Additional Follow-up by: Romero Belling MD,  March 26, 2009 2:02 PM    Additional Follow-up for Phone Call Additional follow up Details #2::    message left on voicemail that RX are ready to pick up. Follow-up by: Theresia Lo RN,  March 26, 2009 2:11 PM  Prescriptions: VENTOLIN HFA 108 (90 BASE) MCG/ACT AERS (ALBUTEROL SULFATE) 2 puffs q4-6 hrs as needed  #1 x 3   Entered and Authorized by:   Romero Belling MD   Signed by:   Romero Belling MD on 03/26/2009   Method used:   Print then Give to Patient   RxID:   [id number] ALPRAZOLAM 1 MG TABS (ALPRAZOLAM) 1 by mouth three times a day as needed for anxiety.  do not fill until 01/04/09  #90 x 0   Entered and Authorized by:   Romero Belling MD   Signed by:   Romero Belling MD on 03/26/2009   Method used:   Print then Give to Patient   RxID:   [id number] HYDROCODONE-ACETAMINOPHEN 10-325  MG TABS (HYDROCODONE-ACETAMINOPHEN) 1 by mouth q6hr as needed severe pain.  Do not fill until 02/03/09  #60 x 0   Entered and Authorized by:   Romero Belling MD   Signed by:   Romero Belling MD on 03/26/2009   Method used:   Print then Give to Patient   RxID:   [id number]

## 2010-03-18 NOTE — Assessment & Plan Note (Signed)
Summary: routine visit/eo   Vital Signs:  Patient profile:   35 year old female Height:      63 inches Weight:      214 pounds BMI:     38.05 BSA:     1.99 Temp:     97.8 degrees F Pulse rate:   84 / minute BP sitting:   121 / 81  Vitals Entered By: Jone Baseman CMA (November 22, 2009 1:40 PM) CC: f/u Is Patient Diabetic? No Pain Assessment Patient in pain? no        Primary Care Provider:  Alvia Grove DO  CC:  f/u.  History of Present Illness: 35 yo female here for follow up of weight gain and intrest in weight loss meds. 1.  Obesity: Pt reports gradual weight gain since the birth of her last child (almost 3 years ago). Pt continues to exercise 3-4 times per week, workout regimen includes walking for about an hour at a time. No weight training.  Pt reports using Adipex in the past with good results.  Stopped using it once she discovered she was pregnant with her last child. Denies having any side effects with the medicine and very interested in re-starting it. Eats 5-6 small meals a day.  Tried loosing weight with no success for the past 12 months. 2.Seizures, hx of. : Taking topamax per previous visit.  Has completely stopped other seizure meds.  Denies any recent history of seizure activity.  Habits & Providers  Alcohol-Tobacco-Diet     Alcohol drinks/day: 0     Tobacco Status: current     Tobacco Counseling: to quit use of tobacco products     Cigarette Packs/Day: <0.25     Year Quit: 03/2009  Exercise-Depression-Behavior     Does Patient Exercise: yes     Exercise Counseling: to improve exercise regimen     Exercise (avg: min/session): 60 min/4x's weekly     Have you felt down or hopeless? no  Current Problems (verified): 1)  Insomnia  (ICD-780.52) 2)  Allergic Rhinitis  (ICD-477.9) 3)  Tobacco Abuse  (ICD-305.1) 4)  Otitis Externa  (ICD-380.10) 5)  Impaired Glucose Tolerance  (ICD-271.3) 6)  Anxiety Depression  (ICD-300.4) 7)  Carpal Tunnel Syndrome,  Bilateral  (ICD-354.0) 8)  Excessive Menstrual Bleeding  (ICD-626.2) 9)  Seizure Disorder  (ICD-780.39) 10)  Asthma, Persistent, Mild  (ICD-493.90) 11)  Obesity, Unspecified  (ICD-278.00) 12)  Urticaria, Chronic  (ICD-708.8) 13)  Gerd  (ICD-530.81) 14)  Panic Attack  (ICD-300.01)  Current Medications (verified): 1)  Omeprazole 20 Mg Cpdr (Omeprazole) .Marland Kitchen.. 1 By Mouth Two Times A Day For Reflux 2)  Hydroxyzine Hcl 10 Mg/37ml Syrp (Hydroxyzine Hcl) .... Take 2.5 Tsp By Mouth 4x Daily As Needed.  Disp Disp 250cc 3)  Ventolin Hfa 108 (90 Base) Mcg/act Aers (Albuterol Sulfate) .... 2 Puffs Q4-6 Hrs As Needed 4)  Qvar 80 Mcg/act Aers (Beclomethasone Dipropionate) .... 2 Puffs Two Times A Day For Asthma Maintenance 5)  Metformin Hcl 500 Mg Tabs (Metformin Hcl) .Marland Kitchen.. 1 By Mouth Two Times A Day 6)  Cortisporin 3.5-10000-1 Soln (Neomycin-Polymyxin-Hc) .... 4 Dropps in Each Ear Three Times A Day For 10 Days.  Disp 1 Bottle. 7)  Phentermine Hcl 15 Mg Caps (Phentermine Hcl) .... Take 1 Pill By Mouth Two Times A Day 8)  Topamax 200 Mg Tabs (Topiramate)  Allergies (verified): No Known Drug Allergies  Past History:  Past Medical History: Last updated: 07/25/2009 GERD Seizure disorder - has been  seen at [facility] and records in chart.  they are unsure if true seizure d/o. often reports severe pain after seizures requiring narcotics.  currently trying to get back in with [facility] neurology but reportely has an outstanding bill so seizure d/o has been managed at [facility] obesity mild asthma chronic pain - back.  on controlled substances contract chronic urticaria  anxiety/panic attacks requiring benzos tobacco abuse poor dentition IMPAIRED GLUCOSE TOLERANCE (ICD-271.3) CARPAL TUNNEL SYNDROME, BILATERAL (ICD-354.0) EXCESSIVE MENSTRUAL BLEEDING (ICD-626.2) HEART MURMUR, SYSTOLIC (ICD-785.2)  Past Surgical History: Last updated: 02/28/2008 planned tooth extraction 03/2008  Family History: Last updated:  07/25/2009 Mother (elizabeth Spinola)  father (eric Sypher) sister Efraim Kaufmann Lavelle) brother (michael Goda) child with autism spectrum disorder (see individual charts in EMR)  Social History: Last updated: 07/25/2009 Works in a Education officer, 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 Smokes 1/2 ppd  Denies ETOH use reports she does exercise Dropped out of high school often moving in and out with multiple family members (see family history for names).  family members often report that Stassi is not truthful in her account of her medical problems.  Risk Factors: Alcohol Use: 0 (11/22/2009) Exercise: yes (11/22/2009)  Risk Factors: Smoking Status: current (11/22/2009) Packs/Day: <0.25 (11/22/2009)  Review of Systems  The patient denies anorexia, fever, weight loss, weight gain, vision loss, decreased hearing, hoarseness, chest pain, syncope, dyspnea on exertion, peripheral edema, prolonged cough, headaches, hemoptysis, abdominal pain, melena, hematochezia, severe indigestion/heartburn, hematuria, incontinence, genital sores, muscle weakness, suspicious skin lesions, transient blindness, difficulty walking, depression, unusual weight change, abnormal bleeding, enlarged lymph nodes, angioedema, breast masses, and testicular masses.    Physical Exam  General:  VS reviewed, alert, well-developed, well-nourished, and well-hydrated.   Lungs:  Normal respiratory effort, chest expands symmetrically. Lungs are clear to auscultation, no crackles or wheezes. Heart:  Normal rate and regular rhythm. S1 and S2 normal without gallop, murmur, click, rub or other extra sounds. Abdomen:  Bowel sounds positive,abdomen soft and non-tender without masses, organomegaly or hernias noted. obese Neurologic:  alert & oriented X3, cranial nerves II-XII intact, strength normal in all  extremities, and DTRs symmetrical and normal.   Skin:  turgor normal, color normal, and no rashes.   Psych:  Oriented X3, memory intact for recent and remote, normally interactive, good eye contact, not anxious appearing, and not depressed appearing.     Impression & Recommendations:  Problem # 1:  OBESITY, UNSPECIFIED (ICD-278.00) Counseled by pharmacist about risks and benefits of Phentermine for >20 minutes.  Pt verbalizes understanding and agrees to follow up plan as described in pt instructions.  Emphasized the limitations of ths medicine and discussed reasonable expectations of weight loss of 1-2 lbs per week. I clearly explained the neccesity for close follow up and confirmed that I would not refill this medicine if pt did not keep her appt.  She agreed.  Monitor BP and HR and weight at next visit.  Low threshold for stopping med if pt becomes hypertensive or tachycardic.  Orders: [facility]- Est  Level 4 ([address]11)  Problem # 2:  IMPAIRED GLUCOSE TOLERANCE (ICD-271.3) refilled meds.  Reviewed s/e of metformin (nausea and diarrhea), if pt experiences, pt instructed to stop medicine. Orders: [facility]- Est  Level 4 (91478)  Problem # 3:  SEIZURE DISORDER (ICD-780.39) Assessment: Improved stable.  Continue taking topamax. The following medications were removed from the medication list:    Dilantin  100 Mg Caps (Phenytoin sodium extended) .Marland Kitchen... 2 by mouth two times a day for seizures.    Keppra 500 Mg Tabs (Levetiracetam) .Marland Kitchen... 1 by mouth two times a day for seizures Her updated medication list for this problem includes:    Topamax 200 Mg Tabs (Topiramate)  Complete Medication List: 1)  Omeprazole 20 Mg Cpdr (Omeprazole) .Marland Kitchen.. 1 by mouth two times a day for reflux 2)  Hydroxyzine Hcl 10 Mg/38ml Syrp (Hydroxyzine hcl) .... Take 2.5 tsp by mouth 4x daily as needed.  disp disp 250cc 3)  Ventolin Hfa 108 (90 Base) Mcg/act Aers (Albuterol sulfate) .... 2 puffs q4-6 hrs as needed 4)  Qvar 80 Mcg/act Aers  (Beclomethasone dipropionate) .... 2 puffs two times a day for asthma maintenance 5)  Metformin Hcl 500 Mg Tabs (Metformin hcl) .Marland Kitchen.. 1 by mouth two times a day 6)  Cortisporin 3.5-10000-1 Soln (Neomycin-polymyxin-hc) .... 4 dropps in each ear three times a day for 10 days.  disp 1 bottle. 7)  Phentermine Hcl 15 Mg Caps (Phentermine hcl) .... Take 1 pill by mouth two times a day 8)  Topamax 200 Mg Tabs (Topiramate)  Patient Instructions: 1)  Nice to see you today! 2)  Make sure you call and cancel your appts, if you no show one more time, you will be fired.  3)  We aill start Adipex, one pill in the morning and one in the afternoon for weight loss. 4)  Remember this is not a magic pill and you need to exercise and diet to lose weight. 5)  I am giving you enough pills for 10 days, you are responsable for following up with me within 10 days to check your blood pressure and progress.  If you do not I will not refill it until I see you. 6)  Make an appt within the next 10 days (Ok to double book) Prescriptions: PHENTERMINE HCL 15 MG CAPS (PHENTERMINE HCL) take 1 pill by mouth two times a day  #20 x 0   Entered and Authorized by:   Alvia Grove DO   Signed by:   Alvia Grove DO on 11/28/2009   Method used:   Handwritten   RxID:   [id number] METFORMIN HCL 500 MG TABS (METFORMIN HCL) 1 by mouth two times a day  #60 x 6   Entered and Authorized by:   Alvia Grove DO   Signed by:   Alvia Grove DO on 11/22/2009   Method used:   Electronically to        [id number] (retail)       [address]       [place], [place]  [address]       Ph: [phone] or [phone]       Fax: [phone]   RxID:   [id number]

## 2010-03-18 NOTE — Progress Notes (Signed)
 Summary: Emergency Line Call  Phone Note Call from Patient Call back at [phone]   Caller: Patient Call For: Emergency Line Call Summary of Call: Pt had flu shot yesterday, now site is painful, swollen, red.  No drainage, no fevers/chills/V/N.  No HA.  No spreading of erythema.  Advised to use OTC pain meds like tylenol  and call office in AM for SDA to ensure no infection.  Pt agreeable. Initial call taken by: Debby Petties MD,  December 05, 2008 6:58 PM

## 2010-03-18 NOTE — Progress Notes (Signed)
Summary: triage  Phone Note Call from Patient Call back at Home Phone [phone]   Caller: Patient Summary of Call: Pt is coughing and hurting in chest and back.  Has fever asking to be seen today. Initial call taken by: Clydell Hakim,  February 18, 2009 11:55 AM  Follow-up for Phone Call        Cough, fever and congesiton since last Thursday.  Temp up to 103.  After Tylenol only came down to 102.  Having difficulty sleeping.  Also experiencing N&V, both kids had stomach virus over the weekend.  Having a difficult time keeping water down and feels dehydrated.  [place] appt made for this afternoon. Follow-up by: Dennison Nancy RN,  February 18, 2009 12:14 PM

## 2010-03-18 NOTE — Progress Notes (Signed)
Summary: [facility]  Phone Note Call from Patient Call back at [facility] Phone [phone]   Caller: Patient Summary of Call: Pt asking if she should take the Metformin if she did not test positive for sugar?  Also she is asking if her heartburn medication was sent in. Initial call taken by: Clydell Hakim,  May 30, 2009 3:54 PM  Follow-up for Phone Call        rx for omeprozole sent to [facility] , [address] . it was sent in yesterday it appears from med list but was sent to [place] , [address]. however [facility] did not have it.  will send  message to MD  about metformin as patient is confused about taking metformin if her Blood Sugar was not elevated. Follow-up by: Theresia Lo RN,  May 30, 2009 4:19 PM  Additional Follow-up for Phone Call Additional follow up Details #1::        blood sugar was elevated but in prediabetes range, not frank diabetes.  regardless, metformin will not lower blood sugars to the point that she will need to check her sugars.  it may however help her given this prediabetes state keep the sugars in a more normal range and even help weight loss.  patient was given paper script for all of her medications yesterday Additional Follow-up by: Ancil Boozer  MD,  May 30, 2009 4:33 PM    patient notified.  Theresia Lo RN  May 30, 2009 5:08 PM

## 2010-03-18 NOTE — Miscellaneous (Signed)
Summary: DNKA'd derm appt  DNKA'd derm appt on 05/16/09 Ancil Boozer  MD  Jun 19, 2009 8:38 AM   Clinical Lists Changes

## 2010-03-18 NOTE — Miscellaneous (Signed)
Summary: patient summary  Clinical Lists Changes patient often calls in to request refills on controlled substances for her husband and often is defensive on phone and often telling that they cannot come in due to husband Lisa Simpson) having just started a new job.   on Pain Contract as of 07/02/2008.    Past History:  Past Medical History: GERD Seizure disorder - has been seen at [facility] and records in chart.  they are unsure if true seizure d/o. often reports severe pain after seizures requiring narcotics.  currently trying to get back in with [facility] neurology but reportely has an outstanding bill so seizure d/o has been managed at [facility] obesity mild asthma chronic pain - back.  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 Smokes 1/2 ppd  Denies ETOH use reports she does exercise Dropped out of high school often moving in and out with multiple family members (see family history for names).  family members often report that Lisa Simpson is not truthful in her account of her medical problems.  (07/25/2009 11:26) Added new observation of FAMILY HX: Mother (Lisa Simpson)  father (Lisa Simpson) sister Lisa Simpson) brother (Lisa Simpson) child with autism spectrum disorder (see individual charts in EMR)  (07/25/2009 11:26) Added new observation of PAST MED HX: GERD Seizure disorder - has been seen at [facility] and records in chart.  they are unsure if true seizure d/o. often reports severe pain after seizures requiring narcotics.  currently trying to get back in with [facility] neurology but reportely has an outstanding bill so seizure d/o has been managed at [facility] obesity mild asthma chronic pain - back.  on controlled substances contract chronic urticaria  anxiety/panic attacks requiring benzos tobacco abuse poor dentition IMPAIRED GLUCOSE TOLERANCE (ICD-271.3) CARPAL TUNNEL SYNDROME, BILATERAL (ICD-354.0) EXCESSIVE MENSTRUAL BLEEDING (ICD-626.2) HEART MURMUR, SYSTOLIC (ICD-785.2)  (07/25/2009 11:26)     Allergies: No Known Drug Allergies      Complete Medication List: 1)  Omeprazole 20 Mg Cpdr (Omeprazole) .Marland Kitchen.. 1 by mouth two times a day for reflux 2)  Dilantin 100 Mg Caps (Phenytoin sodium extended) .... 2 by mouth two times a day for seizures. 3)  Hydroxyzine Hcl 10 Mg/23ml Syrp (Hydroxyzine hcl) .... Take 2.5 tsp by mouth 4x daily as needed.  disp disp 250cc 4)  Ventolin Hfa 108 (90 Base) Mcg/act Aers (Albuterol sulfate) .... 2 puffs q4-6 hrs as needed 5)  Flonase 50 Mcg/act Susp (Fluticasone propionate) .... 2 sprays each nostril daily for sinus congestion 6)  Qvar 80 Mcg/act Aers (Beclomethasone dipropionate) .... 2 puffs two times a day for asthma maintenance 7)  Keppra 500 Mg Tabs (Levetiracetam) .Marland Kitchen.. 1 by mouth two times a day for seizures 8)  Hydrocodone-acetaminophen 10-325 Mg Tabs (Hydrocodone-acetaminophen) .Marland Kitchen.. 1 by mouth q6hrs as needed severe pain.  okay to fill on 07/10/09 9)  Alprazolam 1 Mg Tabs (Alprazolam) .Marland Kitchen.. 1 by mouth three times a day as needed anxiety.  do not fill until 02/03/09 10)  Metformin Hcl  500 Mg Tabs (Metformin hcl) .Marland Kitchen.. 1 by mouth two times a day 11)  Mobic 7.5 Mg Tabs (Meloxicam) .Marland Kitchen.. 1 tab by mouth daily w/ food x 10 days 12)  Tramadol Hcl 50 Mg  Tabs (Tramadol hcl) .Marland Kitchen.. 1 tab every 6-8 hours as needed for breakthrough pain  Past, Family, and Social History Past History: GERD Seizure disorder - has been seen at [facility] and records in chart.  they are unsure if true seizure d/o. often reports severe pain after seizures requiring narcotics.  currently trying to get back in with [facility] neurology but reportely has an outstanding bill so seizure d/o has been managed at [facility] obesity mild asthma chronic pain - back.  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 Smokes 1/2 ppd  Denies ETOH use reports she does exercise Dropped out of high school often moving in and out with multiple family members (see family history for names).  family members often report that Lisa Simpson is not truthful in her account of her medical problems.

## 2010-03-18 NOTE — Miscellaneous (Signed)
Summary: Discussion regarding no shows  It was brought to my attention today that this patient received a suspention lettter on 08/21/09.  According to our policy, once the patient has been placed on suspension status they are to be dismissed if they have one more no show. According to her records she has had 2 more no shows since receiving the letter (8/10 and 8/18).  Before the letter was sent the patient scheduled and appointment with [place] (tomorrow) therefore I will hold off on dismissal for now.  If she does show up for her appointment tomorrow she is to be informed of her status and that one more no show will result in immediate dismissal.  Additionally, she must have no further no shows for the remainder of the next year before she will be removed from supension status.  If she no shows tomorrow, I will dismiss her and mail the letter immediately.  Dennison Nancy RN  November 21, 2009 2:44 PM

## 2010-03-18 NOTE — Letter (Signed)
Summary: Suspension Letter  [facility]  [address]   [place], [place] [address]   Phone: [phone]  Fax: [phone]    08/21/2009  Lisa Simpson [address] [place], [place]  [address]  Dear Ms. Simpson,  You have missed 4 scheduled appointments with our practice.If you cannot keep your appointment, we expect you to call and cancel at least 24 hours before your appointment time.  As per our policy, we will now only give you limited medical services. means we will not call in a refill for you, or complete a form or make a referral except when you are here for a scheduled office visit.   If you miss 2 more appointments in the next year, we will dismiss you from our practice.  We hope this does not happen.  If you keep your appointments for the next year you will be returned to regular patient status.  We hope these changes will encourage you to keep your appointments so we may provide you the best medical care.   Our office staff can be reached at [phone] Monday through Friday from 8:30 a.m.-5:00 p.m. and will be glad to schedule your appointment as necessary.     Sincerely,   [facility]    Appended Document: Suspension Letter mailed

## 2010-03-18 NOTE — Letter (Signed)
Summary: Probation Letter  [facility]  [address]   [place], [place] [address]   Phone: [phone]  Fax: [phone]    07/18/2009  Lisa Simpson [address] [place], [place]  [address]  Dear Ms. Simpson,  With the goal of better serving all our patients the [facility] is following each patient's missed appointments.  You have missed at least 3 appointments with our practice.If you cannot keep your appointment, we expect you to call at least 24 hours before your appointment time.  Missing appointments prevents other patients from seeing [place] and makes it difficult to provide you with the best possible medical care.      1.   If you miss one more appointment, we will only give you limited medical services. This means we will not call in medication refills, complete a form, or make a referral for you except when you are here for a scheduled office visit.    2.   If you miss 2 or more appointments in the next year, we will dismiss you from our practice.    Our office staff can be reached at [phone] Monday through Friday from 8:30 a.m.-5:00 p.m. and will be glad to schedule your appointment as necessary.    Thank you.   [facility]  Appended Document: Probation Letter Letter unclaimed.

## 2010-03-18 NOTE — Assessment & Plan Note (Signed)
Summary: seizures not controlled,tcb   Vital Signs:  Patient profile:   35 year old female Height:      63 inches Weight:      219.6 pounds BMI:     39.04 Temp:     97.8 degrees F oral Pulse rate:   79 / minute BP sitting:   124 / 82  (right arm) Cuff size:   regular  Vitals Entered By: Garen Grams LPN (May 09, 2009 4:25 PM) CC: multiple complaints Is Patient Diabetic? No Pain Assessment Patient in pain? yes     Location: wrists   Primary Care Provider:  Ancil Boozer  MD  CC:  multiple complaints.  History of Present Illness: seizures: increased while under stress in the past week - has had 6.  at least one witnessed by father - lasted about 3 minutes.  father put his home O2 on her which reportedly seemed to help.  also associated she has noticed worsening migraines.  hasn't been able to get into [facility] medical neurology - went when she thought she had appt and she wasn't actually scheduled.  wants neurologist here.  has quit driving.  before seizure often feels hot.  after she is confused, in pain and weak.  did bite tongue during seizures.  denies loss of urine or stool.  she has been out of her dilantin.  she would like to establish if possible with local neurologist.   wrist pain: hurts to write.  worst in morning. hurts most in thumbs, first 2 fingers.  denies any weakness in hands.    weight: concerned she isn't losing any - did quit smoking 9 wks ago.  has joined [employer] and is going to gym 3x wk  depression: family has her very stressed, particualrly her father eric Traywick. she finds herself crying often.  denies suicidal or homocidal ideation.  doesn't want tos tart med at this point  metallic taste in mouth: has noticed for about 2 wks.  last time this happened she was iron deficient.  she has been having very heavy periods and would like to have her iron levels checked  Habits & Providers  Alcohol-Tobacco-Diet     Tobacco Status: quit     Tobacco  Counseling: to remain off tobacco products     Year Quit: 03/2009  Current Medications (verified): 1)  Omeprazole 40 Mg Cpdr (Omeprazole) .Marland Kitchen.. 1 By Mouth Two Times A Day For Reflux 2)  Dilantin 100 Mg Caps (Phenytoin Sodium Extended) .... 2 By Mouth Two Times A Day For Seizures. 3)  Hydroxyzine Hcl 10 Mg/41ml Syrp (Hydroxyzine Hcl) .... Take 2.5 Tsp By Mouth 4x Daily As Needed.  Disp Disp 250cc 4)  Ventolin Hfa 108 (90 Base) Mcg/act Aers (Albuterol Sulfate) .... 2 Puffs Q4-6 Hrs As Needed 5)  Flonase 50 Mcg/act Susp (Fluticasone Propionate) .... 2 Sprays Each Nostril Daily For Sinus Congestion 6)  Flovent Hfa 110 Mcg/act Aero (Fluticasone Propionate  Hfa) .Marland Kitchen.. 1 Puff Two Times A Day For Asthma Control 7)  Naproxen 500 Mg Tabs (Naproxen) .Marland Kitchen.. 1 By Mouth Two Times A Day As Needed Back Pain 8)  Keppra 500 Mg Tabs (Levetiracetam) .Marland Kitchen.. 1 By Mouth Two Times A Day For Seizures 9)  Hydrocodone-Acetaminophen 10-325 Mg Tabs (Hydrocodone-Acetaminophen) .Marland Kitchen.. 1 By Mouth Q6hrs As Needed Severe Pain. Do Not Fill Until 01/04/09 10)  Alprazolam 1 Mg Tabs (Alprazolam) .Marland Kitchen.. 1 By Mouth Three Times A Day As Needed Anxiety.  Do Not Fill Until 02/03/09 11)  Promethazine Hcl 25 Mg Tabs (Promethazine Hcl) .Marland Kitchen.. 1 By Mouth Three Times A Day As Needed For Nausea  Allergies (verified): No Known Drug Allergies  Past History:  Past medical, surgical, family and social histories (including risk factors) reviewed for relevance to current acute and chronic problems.  Past Medical History: Reviewed history from 12/19/2007 and no changes required. GERD Seizure disorder  Past Surgical History: Reviewed history from 02/28/2008 and no changes required. planned tooth extraction 03/2008  Family History: Reviewed history from 06/04/2008 and no changes required. Mother (elizabeth Vadala)  father (eric Goold) sister Ailene Rud) brother Casimiro Needle Tobon) child with autism spectrum disorder  Social History: Reviewed  history from 02/28/2008 and no changes required. 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 Smokes 1/2 ppd  Denies ETOH use Does not exercise Dropped out of high school Smoking Status:  quit  Review of Systems       per HPI  Physical Exam  General:  awake alert no acute distress. tearful frequently.  appears stressed/anxious and depressed. VS noted Head:  NCAT Eyes:  sclera clear. PERRL.  EOMI.   Mouth:  Oral mucosa and oropharynx without lesions or exudates. MM moist Lungs:  Normal respiratory effort, chest expands symmetrically. Lungs are clear to auscultation, no crackles or wheezes. Heart:  Normal rate and regular rhythm. S1 and S2 normal without gallop, murmur, click, rub or other extra sounds. Msk:  + tinnels sign at wrists bilaterally.  no obvious erythema or swelling.   Neurologic:  alert & oriented X3, cranial nerves II-XII intact, strength normal in all extremities, sensation intact to light touch, gait normal, DTRs symmetrical and normal, and finger-to-nose normal.   Psych:  Oriented X3, depressed affect, and moderately anxious.     Impression & Recommendations:  Problem # 1:  SEIZURE DISORDER (ICD-780.39) Assessment Deteriorated worsened.  will start her dilantin here.  continue keppra.  referral to neurologist locally.   get lab work. f/u in less than 1 month to see if improved.  get dilantin levels at that visit.   Her updated medication list for this problem includes:    Dilantin 100 Mg Caps (Phenytoin sodium extended) .Marland Kitchen... 2 by mouth two times a day for seizures.    Keppra 500 Mg Tabs (Levetiracetam) .Marland Kitchen... 1 by mouth two times a day for seizures  Orders: Comp Met-[facility] [phone] Folate-[facility] (09811-91478) [facility]- Est  Level 4 (29562)  Problem # 2:  CARPAL TUNNEL SYNDROME, BILATERAL (ICD-354.0) Assessment: New  try wrist braces at night.  consider injections if  worsens.   Orders: [facility]- Est  Level 4 (13086)  Problem # 3:  EXCESSIVE MENSTRUAL BLEEDING (ICD-626.2) Assessment: New suspect taste in mouth may be due to anemia as a result of this.  check CBC>   Orders: CBC-[facility] (57846) [facility]- Est  Level 4 (96295)  Problem # 4:  OBESITY, UNSPECIFIED (ICD-278.00) Assessment: Unchanged  told her i suspect she is doing the right things to help her get weight off but stress is makign it more difficult in addition to loss of appetite suppressant in smoking.  encouraged her to continue what she is doing with wt watchers and exercising, try to reduce stress  Orders: [facility]- Est  Level 4 (99214)  Problem # 5:  ANXIETY DEPRESSION (ICD-300.4) Assessment: Deteriorated  no rx at this time.  f/u in 1 month.  encouraged her to take some time for herself to step away  from her stressors.   Orders: [facility]- Est  Level 4 (32951)  Complete Medication List: 1)  Omeprazole 40 Mg Cpdr (Omeprazole) .Marland Kitchen.. 1 by mouth two times a day for reflux 2)  Dilantin 100 Mg Caps (Phenytoin sodium extended) .... 2 by mouth two times a day for seizures. 3)  Hydroxyzine Hcl 10 Mg/39ml Syrp (Hydroxyzine hcl) .... Take 2.5 tsp by mouth 4x daily as needed.  disp disp 250cc 4)  Ventolin Hfa 108 (90 Base) Mcg/act Aers (Albuterol sulfate) .... 2 puffs q4-6 hrs as needed 5)  Flonase 50 Mcg/act Susp (Fluticasone propionate) .... 2 sprays each nostril daily for sinus congestion 6)  Flovent Hfa 110 Mcg/act Aero (Fluticasone propionate  hfa) .Marland Kitchen.. 1 puff two times a day for asthma control 7)  Naproxen 500 Mg Tabs (Naproxen) .Marland Kitchen.. 1 by mouth two times a day as needed back pain 8)  Keppra 500 Mg Tabs (Levetiracetam) .Marland Kitchen.. 1 by mouth two times a day for seizures 9)  Hydrocodone-acetaminophen 10-325 Mg Tabs (Hydrocodone-acetaminophen) .Marland Kitchen.. 1 by mouth q6hrs as needed severe pain. do not fill until 01/04/09 10)  Alprazolam 1 Mg Tabs (Alprazolam) .Marland Kitchen.. 1 by mouth three times a day as needed anxiety.  do not fill  until 02/03/09 11)  Promethazine Hcl 25 Mg Tabs (Promethazine hcl) .Marland Kitchen.. 1 by mouth three times a day as needed for nausea  Patient Instructions: 1)  Please follow up in 1 month to see how things are doing.  2)  Try the wrist braces on both of them at night every night to help your carpal tunnel 3)  We will work on getting you into a neurologist here in town if we can.  Prescriptions: KEPPRA 500 MG TABS (LEVETIRACETAM) 1 by mouth two times a day for seizures  #60 x 1   Entered and Authorized by:   Ancil Boozer  MD   Signed by:   Ancil Boozer  MD on 05/09/2009   Method used:   Print then Give to Patient   RxID:   [id number] DILANTIN 100 MG CAPS (PHENYTOIN SODIUM EXTENDED) 2 by mouth two times a day for seizures.  #120 x 1   Entered and Authorized by:   Ancil Boozer  MD   Signed by:   Ancil Boozer  MD on 05/09/2009   Method used:   Print then Give to Patient   RxID:   [id number]

## 2010-03-18 NOTE — Miscellaneous (Signed)
Summary: referral sent to [facility]  Clinical Lists Changes   referral sent to [facility]. Theresia Lo RN  May 13, 2009 1:47 PM  Appended Document: Orders Update    Clinical Lists Changes  Orders: Added new Referral order of Neurology Referral (Neuro) - Signed

## 2010-03-18 NOTE — Progress Notes (Signed)
Summary: resch  Phone Note Call from Patient   Caller: Patient Summary of Call: pt cannot come due to having diarrhea (son also) resched Initial call taken by: De Nurse,  Jul 08, 2009 8:36 AM

## 2010-03-18 NOTE — Miscellaneous (Signed)
Summary: another fp wants our NPI  Clinical Lists Changes rec'd a message from Darl Pikes at [facility] practice [phone]. they want our NPI. since she has been dismissed can w give this since it has not yet been 30 days> to my supervisor...Marland KitchenMarland KitchenGolden Circle RN  December 05, 2009 4:03 PM spoke with Dennison Nancy, RN. ok if this is an emergency in ED. called Darl Pikes back. it was not an emergency. states she has already been seen. told her to bill the pt as we will not approve this.Golden Circle RN  December 05, 2009 4:09 PM

## 2010-03-18 NOTE — Progress Notes (Signed)
Summary: refill  Phone Note Refill Request Call back at Home Phone [phone] Message from:  Patient  Refills Requested: Medication #1:  VENTOLIN HFA 108 (90 BASE) MCG/ACT AERS 2 puffs q4-6 hrs as needed  Medication #2:  HYDROCODONE-ACETAMINOPHEN 10-325 MG TABS 1 by mouth q6hr as needed severe pain.  Do not fill until 02/03/09  Medication #3:  ALPRAZOLAM 1 MG TABS 1 by mouth three times a day as needed anxiety.  do not fill until 02/03/09 Initial call taken by: De Nurse,  April 22, 2009 11:59 AM  Follow-up for Phone Call        to dr Meredith Leeds covering doctor Follow-up by: Golden Circle RN,  April 22, 2009 12:00 PM    Prescriptions: VENTOLIN HFA 108 (90 BASE) MCG/ACT AERS (ALBUTEROL SULFATE) 2 puffs q4-6 hrs as needed  #1 x 3   Entered and Authorized by:   Myrtie Soman  MD   Signed by:   Myrtie Soman  MD on 04/23/2009   Method used:   Print then Give to Patient   RxID:   [id number] ALPRAZOLAM 1 MG TABS (ALPRAZOLAM) 1 by mouth three times a day as needed for anxiety.  do not fill until 01/04/09  #90 x 0   Entered and Authorized by:   Myrtie Soman  MD   Signed by:   Myrtie Soman  MD on 04/23/2009   Method used:   Print then Give to Patient   RxID:   [id number] HYDROCODONE-ACETAMINOPHEN 10-325 MG TABS (HYDROCODONE-ACETAMINOPHEN) 1 by mouth q6hr as needed severe pain.  Do not fill until 02/03/09  #60 x 0   Entered and Authorized by:   Myrtie Soman  MD   Signed by:   Myrtie Soman  MD on 04/23/2009   Method used:   Print then Give to Patient   RxID:   [id number]   Appended Document: refill LMOVM of pt that rx are ready for pickup

## 2010-03-18 NOTE — Miscellaneous (Signed)
Summary: lm  Clinical Lists Changes lm asking pt to call back. need to know if she wants to change suppliers for DM items. if so, will need an appt with pcp. form in triage office.Golden Circle RN  May 31, 2009 12:54 PM  states she fell last night over some construction debris in her home (doing renovations) hip & leg bruised & swollen & painful. walking with a cane. wants referral to her orthopedist. will be here at 9:45 to seen Dr. Burnadette Pop.   she will still need to make an appt with pcp to get the DM suppy company changed. states it is less expensive for her to change.Golden Circle RN  June 04, 2009 8:59 AM

## 2010-03-18 NOTE — Progress Notes (Signed)
Summary: phn msg  Phone Note Call from Patient   Caller: Patient Summary of Call: Pt says she is returning Dr. Meredith Leeds phone call. Initial call taken by: Clydell Hakim,  July 24, 2009 10:57 AM  Follow-up for Phone Call        call is regarding her husband linville.  he needs refills on his chronic pain meds and anxiety meds.  he has dnka'd multiple f/u appts.  i will not refill until he is seen.  she states he cannot come because of again starting a new job.  regardless, no refills until appt.  she is aware.  he will come at 8:30 on friday morning. will copy this to husband's chart as well Follow-up by: Ancil Boozer  MD,  July 24, 2009 12:12 PM

## 2010-03-18 NOTE — Progress Notes (Signed)
Summary: refill  Phone Note Refill Request Call back at Home Phone [phone] Message from:  Patient  Refills Requested: Medication #1:  OMEPRAZOLE 20 MG CPDR 1 by mouth two times a day for reflux  Medication #2:  HYDROXYZINE HCL 10 MG/5ML SYRP take 2.5 tsp by mouth 4x daily as needed.  Disp disp 250cc  Medication #3:  VENTOLIN HFA 108 (90 BASE) MCG/ACT AERS 2 puffs q4-6 hrs as needed Initial call taken by: De Nurse,  November 26, 2009 4:31 PM    Prescriptions: VENTOLIN HFA 108 (90 BASE) MCG/ACT AERS (ALBUTEROL SULFATE) 2 puffs q4-6 hrs as needed  #1 x 6   Entered and Authorized by:   Alvia Grove DO   Signed by:   Alvia Grove DO on 11/27/2009   Method used:   Electronically to        [facility]  [id number] (retail)       [address]       [place], [place]  [address]       Ph: [phone] or [phone]       Fax: [phone]   RxID:   [id number] HYDROXYZINE HCL 10 MG/5ML SYRP (HYDROXYZINE HCL) take 2.5 tsp by mouth 4x daily as needed.  Disp disp 250cc  #250 x 6   Entered and Authorized by:   Alvia Grove DO   Signed by:   Alvia Grove DO on 11/27/2009   Method used:   Electronically to        [facility]  [id number] (retail[facility]       [address]       [place], [place]  [address]       Ph: [phone] or [phone]       Fax: [phone]   RxID:   [id number] OMEPRAZOLE 20 MG CPDR (OMEPRAZOLE) 1 by mouth two times a day for reflux  #60 x 6   Entered and Authorized by:   Alvia Grove DO   Signed by:   Alvia Grove DO on 11/27/2009   Method used:   [facility]ically to        [id number] ([facility]       [address]       [place], [place]  [address]       Ph: [phone] or [phone]       Fax: [phone]   RxID:   [id number]

## 2010-03-18 NOTE — Assessment & Plan Note (Signed)
Summary: R hip pain s/p fall   Vital Signs:  Patient profile:   35 year old female Weight:      217.3 pounds BMI:     38.63 Temp:     97.4 degrees F Pulse rate:   70 / minute BP sitting:   118 / 81  (right arm)  Vitals Entered By: Starleen Blue RN (June 04, 2009 10:25 AM) CC: fell Is Patient Diabetic? Yes Pain Assessment Patient in pain? yes     Location: rt  leg/hip Intensity: 10   Primary Care Provider:  Ancil Boozer  MD  CC:  fell.  History of Present Illness: 35yo F here b/c of fall  Fall: Fell last night into a vent.  States that Right leg went into a hole.  Has an abrasion on R lateral leg and top of right foot.  Has sharp pain of the right hip down the leg.  Worse with walking.  Better with sitting still and leg elevated.  Currently taking vicodin without much relief.  Habits & Providers  Alcohol-Tobacco-Diet     Tobacco Status: current     Cigarette Packs/Day: <0.25  Current Medications (verified): 1)  Omeprazole 40 Mg Cpdr (Omeprazole) .Marland Kitchen.. 1 By Mouth Two Times A Day For Reflux 2)  Dilantin 100 Mg Caps (Phenytoin Sodium Extended) .... 2 By Mouth Two Times A Day For Seizures. 3)  Hydroxyzine Hcl 10 Mg/75ml Syrp (Hydroxyzine Hcl) .... Take 2.5 Tsp By Mouth 4x Daily As Needed.  Disp Disp 250cc 4)  Ventolin Hfa 108 (90 Base) Mcg/act Aers (Albuterol Sulfate) .... 2 Puffs Q4-6 Hrs As Needed 5)  Flonase 50 Mcg/act Susp (Fluticasone Propionate) .... 2 Sprays Each Nostril Daily For Sinus Congestion 6)  Qvar 80 Mcg/act Aers (Beclomethasone Dipropionate) .... 2 Puffs Two Times A Day For Asthma Maintenance 7)  Keppra 500 Mg Tabs (Levetiracetam) .Marland Kitchen.. 1 By Mouth Two Times A Day For Seizures 8)  Hydrocodone-Acetaminophen 10-325 Mg Tabs (Hydrocodone-Acetaminophen) .Marland Kitchen.. 1 By Mouth Q6hrs As Needed Severe Pain. Do Not Fill Until 01/04/09 9)  Alprazolam 1 Mg Tabs (Alprazolam) .Marland Kitchen.. 1 By Mouth Three Times A Day As Needed Anxiety.  Do Not Fill Until 02/03/09 10)  Metformin Hcl 500  Mg Tabs (Metformin Hcl) .Marland Kitchen.. 1 By Mouth Two Times A Day 11)  Mobic 7.5 Mg Tabs (Meloxicam) .Marland Kitchen.. 1 Tab By Mouth Daily W/ Food X 10 Days 12)  Tramadol Hcl 50 Mg Tabs (Tramadol Hcl) .Marland Kitchen.. 1 Tab Every 6-8 Hours As Needed For Breakthrough Pain  Allergies (verified): No Known Drug Allergies  Social History: Smoking Status:  current Packs/Day:  <0.25  Review of Systems       no neck pain, LOC, HA  Physical Exam  General:  VS Reviewed. Obese, well appearing, NAD.  Head:  atraumatic Neck:  full rom Msk:  R hip exam Inspection- Excess adipose tissue, no ecchymosis, erythema, or obvious deformity Palpation- moderate ttp of greater trochanter ROM- limited flexion and extension due to pain; worse pain with internal rotation; limited internal and external rotation strength- dec due to pain Neurologic:  sensation grossly intact of lower ext atalgic gait favoring right leg Skin:  large 20 cm superficial abrasion on R lateral leg- not actively bleeding mild ecchymosis on top of right foot   Impression & Recommendations:  Problem # 1:  HIP PAIN, RIGHT (ICD-719.45) Assessment New Acute R hip pain w/ superficial abrasion of R lower lateral leg s/p fall yesterday. I suspect traumatic bursitis but will obtain xray  to r/o fracture. Will start her on Mobic x 10 days for inflammation.  Will add tramadol for breakthrough pain. She has vicodin on hand if needed. Will call her with the xray results and f/u in 2 weeks to reassess.  The following medications were removed from the medication list:    Naproxen 500 Mg Tabs (Naproxen) .Marland Kitchen... 1 by mouth two times a day as needed back pain Her updated medication list for this problem includes:    Hydrocodone-acetaminophen 10-325 Mg Tabs (Hydrocodone-acetaminophen) .Marland Kitchen... 1 by mouth q6hrs as needed severe pain. do not fill until 01/04/09    Mobic 7.5 Mg Tabs (Meloxicam) .Marland Kitchen... 1 tab by mouth daily w/ food x 10 days    Tramadol Hcl 50 Mg Tabs (Tramadol hcl)  .Marland Kitchen... 1 tab every 6-8 hours as needed for breakthrough pain  Orders: Radiology other (Radiology Other) [facility]- Est Level  3 ([address]188)  Complete Medication List: 1)  Omeprazole 40 Mg Cpdr (Omeprazole) .Marland Kitchen.. 1 by mouth two times a day for reflux 2)  Dilantin 100 Mg Caps (Phenytoin sodium extended) .... 2 by mouth two times a day for seizures. 3)  Hydroxyzine Hcl 10 Mg/60ml Syrp (Hydroxyzine hcl) .... Take 2.5 tsp by mouth 4x daily as needed.  disp disp 250cc 4)  Ventolin Hfa 108 (90 Base) Mcg/act Aers (Albuterol sulfate) .... 2 puffs q4-6 hrs as needed 5)  Flonase 50 Mcg/act Susp (Fluticasone propionate) .... 2 sprays each nostril daily for sinus congestion 6)  Qvar 80 Mcg/act Aers (Beclomethasone dipropionate) .... 2 puffs two times a day for asthma maintenance 7)  Keppra 500 Mg Tabs (Levetiracetam) .Marland Kitchen.. 1 by mouth two times a day for seizures 8)  Hydrocodone-acetaminophen 10-325 Mg Tabs (Hydrocodone-acetaminophen) .Marland Kitchen.. 1 by mouth q6hrs as needed severe pain. do not fill until 01/04/09 9)  Alprazolam 1 Mg Tabs (Alprazolam) .Marland Kitchen.. 1 by mouth three times a day as needed anxiety.  do not fill until 02/03/09 10)  Metformin Hcl 500 Mg Tabs (Metformin hcl) .Marland Kitchen.. 1 by mouth two times a day 11)  Mobic 7.5 Mg Tabs (Meloxicam) .Marland Kitchen.. 1 tab by mouth daily w/ food x 10 days 12)  Tramadol Hcl 50 Mg Tabs (Tramadol hcl) .Marland Kitchen.. 1 tab every 6-8 hours as needed for breakthrough pain  Patient Instructions: 1)  Please schedule a follow-up appointment in 2 weeks to reassess.   2)  I'll call you with the xray results. 3)  Continue with ice, rest, and elevation.   4)  If you have any upset stomach or bleeding from the medicine, call us. Prescriptions: TRAMADOL HCL 50 MG TABS (TRAMADOL HCL) 1 tab every 6-8 hours as needed for breakthrough pain  #30 x 0   Entered and Authorized by:   Marisue Ivan  MD   Signed by:   Marisue Ivan  MD on 06/04/2009   Method used:   Print then Give to Patient   RxID:    [id number] MOBIC 7.5 MG TABS (MELOXICAM) 1 tab by mouth daily w/ food x 10 days  #10 x 0   Entered and Authorized by:   Marisue Ivan  MD   Signed by:   Marisue Ivan  MD on 06/04/2009   Method used:   Print then Give to Patient   RxID:   [id number]

## 2010-03-18 NOTE — Assessment & Plan Note (Signed)
Summary: meds/kh   Vital Signs:  Patient profile:   35 year old female Height:      63 inches Weight:      223 pounds BMI:     39.65 BSA:     2.03 Temp:     97.6 degrees F Pulse rate:   87 / minute BP sitting:   125 / 78  Vitals Entered By: Jone Baseman CMA (May 29, 2009 9:02 AM) CC: f/u seizures, weight, asthma, smoking, ear pain Is Patient Diabetic? No Pain Assessment Patient in pain? no        Primary Care Provider:  Ancil Boozer  MD  CC:  f/u seizures, weight, asthma, smoking, and ear pain.  History of Present Illness: seizures: no further seizures since last appt here and addition of dilantin.  currently working on getting into [facility] (she reports a past bill/balance that is keeping her from getting appt currently).    weight: still very concerned about inability to lose weight.  states she is exercising daily, eating 6 small meals daily but is still drinking lots of sodas.  made aware today that she has borderline diabetes.  she requests adiphex to help her lose weight (she states she has had success with this previously and was able to keep weight off even after going off medication).    asthma: having to use inhaler  (albuterol) very often since trees started blooming/flowers blooming.  never got maintenance inhaler as recommended last time.   smoking: quit now for only a few days shy of 3 months.  states she feels better since quitting.  ear pain: left ear is hurting again now for a few days.  no drainage.  no fevers. no swelling outside of ear. no nasal congestion that she reports  Habits & Providers  Alcohol-Tobacco-Diet     Tobacco Status: quit     Tobacco Counseling: to remain off tobacco products  Current Medications (verified): 1)  Omeprazole 40 Mg Cpdr (Omeprazole) .Marland Kitchen.. 1 By Mouth Two Times A Day For Reflux 2)  Dilantin 100 Mg Caps (Phenytoin Sodium Extended) .... 2 By Mouth Two Times A Day For Seizures. 3)  Hydroxyzine Hcl 10 Mg/58ml Syrp  (Hydroxyzine Hcl) .... Take 2.5 Tsp By Mouth 4x Daily As Needed.  Disp Disp 250cc 4)  Ventolin Hfa 108 (90 Base) Mcg/act Aers (Albuterol Sulfate) .... 2 Puffs Q4-6 Hrs As Needed 5)  Flonase 50 Mcg/act Susp (Fluticasone Propionate) .... 2 Sprays Each Nostril Daily For Sinus Congestion 6)  Qvar 80 Mcg/act Aers (Beclomethasone Dipropionate) .... 2 Puffs Two Times A Day For Asthma Maintenance 7)  Naproxen 500 Mg Tabs (Naproxen) .Marland Kitchen.. 1 By Mouth Two Times A Day As Needed Back Pain 8)  Keppra 500 Mg Tabs (Levetiracetam) .Marland Kitchen.. 1 By Mouth Two Times A Day For Seizures 9)  Hydrocodone-Acetaminophen 10-325 Mg Tabs (Hydrocodone-Acetaminophen) .Marland Kitchen.. 1 By Mouth Q6hrs As Needed Severe Pain. Do Not Fill Until 01/04/09 10)  Alprazolam 1 Mg Tabs (Alprazolam) .Marland Kitchen.. 1 By Mouth Three Times A Day As Needed Anxiety.  Do Not Fill Until 02/03/09 11)  Metformin Hcl 500 Mg Tabs (Metformin Hcl) .Marland Kitchen.. 1 By Mouth Two Times A Day  Allergies (verified): No Known Drug Allergies  Past History:  Past medical, surgical, family and social histories (including risk factors) reviewed, and no changes noted (except as noted below).  Past Medical History: GERD Seizure disorder obesity mild asthma chronic pain chronic urticaria anxiety/panic attacks tobacco abuse, hx of  Past Surgical History: Reviewed history  from 02/28/2008 and no changes required. planned tooth extraction 03/2008  Family History: Reviewed history from 06/04/2008 and no changes required. Mother (elizabeth Badalamenti)  father (eric Budde) sister Ailene Rud) brother Casimiro Needle Betzler) child with autism spectrum disorder  Social History: Reviewed history from 02/28/2008 and no changes required. 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 Smokes 1/2 ppd  Denies ETOH use Does not exercise Dropped out of high school  Review of Systems       per  HPI  Physical Exam  General:  Well-developed,well-nourished,in no acute distress; alert,appropriate and cooperative throughout examination.  VS reviewed.  much more cheerful today Ears:  External ear exam shows no significant lesions or deformities.  Otoscopic examination reveals clear canals, tympanic membranes are intact bilaterally without bulging, retraction, inflammation or discharge. Hearing is grossly normal bilaterally. some mild scarring from previous infections on left TM Nose:  External nasal examination shows no deformity or inflammation. Nasal mucosa are pink and moist without lesions or exudates. Neck:  No deformities, masses, or tenderness noted. Lungs:  Normal respiratory effort, chest expands symmetrically. Lungs are clear to auscultation, no crackles or wheezes. Heart:  Normal rate and regular rhythm. S1 and S2 normal without gallop, murmur, click, rub or other extra sounds.   Impression & Recommendations:  Problem # 1:  OBESITY, UNSPECIFIED (ICD-278.00) Assessment Unchanged  discussed still not comfortable with use of adiphex but she does have the right to a 2nd opinion.  encouraged her to discontinue use of soft drinks partiucarly in light of impaired glucose.  she did have borderline diabetes during all of her 3 pregnancies.   Orders: [facility]- Est  Level 4 (20254)  Problem # 2:  ASTHMA, PERSISTENT, MILD (ICD-493.90) Assessment: Deteriorated  qvar added.  monitor control with this addition  Her updated medication list for this problem includes:    Ventolin Hfa 108 (90 Base) Mcg/act Aers (Albuterol sulfate) .Marland Kitchen... 2 puffs q4-6 hrs as needed    Qvar 80 Mcg/act Aers (Beclomethasone dipropionate) .Marland Kitchen... 2 puffs two times a day for asthma maintenance  Orders: [facility]- Est  Level 4 (27062)  Problem # 3:  SEIZURE DISORDER (ICD-780.39) Assessment: Improved  no further seizures.  continue to work on getting into [facility] Her updated medication list for this problem  includes:    Dilantin 100 Mg Caps (Phenytoin sodium extended) .Marland Kitchen... 2 by mouth two times a day for seizures.    Keppra 500 Mg Tabs (Levetiracetam) .Marland Kitchen... 1 by mouth two times a day for seizures  Orders: [facility]- Est  Level 4 (99214)  Problem # 4:  EAR PAIN, LEFT (ICD-388.70) Assessment: New  no acute findings on examination.  monitor symptoms.  ? if referred pain from somewhere else  Orders: [facility]- Est  Level 4 (37628)  Problem # 5:  TOBACCO ABUSE (ICD-305.1) Assessment: Improved  praised on continued cessation  Orders: [facility]- Est  Level 4 (31517)  Complete Medication List: 1)  Omeprazole 40 Mg Cpdr (Omeprazole) .Marland Kitchen.. 1 by mouth two times a day for reflux 2)  Dilantin 100 Mg Caps (Phenytoin sodium extended) .... 2 by mouth two times a day for seizures. 3)  Hydroxyzine Hcl 10 Mg/66ml Syrp (Hydroxyzine hcl) .... Take 2.5 tsp by mouth 4x daily as needed.  disp disp 250cc 4)  Ventolin Hfa 108 (90 Base) Mcg/act Aers (Albuterol sulfate) .... 2 puffs q4-6 hrs as needed 5)  Flonase 50 Mcg/act Susp (Fluticasone  propionate) .... 2 sprays each nostril daily for sinus congestion 6)  Qvar 80 Mcg/act Aers (Beclomethasone dipropionate) .... 2 puffs two times a day for asthma maintenance 7)  Naproxen 500 Mg Tabs (Naproxen) .Marland Kitchen.. 1 by mouth two times a day as needed back pain 8)  Keppra 500 Mg Tabs (Levetiracetam) .Marland Kitchen.. 1 by mouth two times a day for seizures 9)  Hydrocodone-acetaminophen 10-325 Mg Tabs (Hydrocodone-acetaminophen) .Marland Kitchen.. 1 by mouth q6hrs as needed severe pain. do not fill until 01/04/09 10)  Alprazolam 1 Mg Tabs (Alprazolam) .Marland Kitchen.. 1 by mouth three times a day as needed anxiety.  do not fill until 02/03/09 11)  Metformin Hcl 500 Mg Tabs (Metformin hcl) .Marland Kitchen.. 1 by mouth two times a day  Other Orders: [facility] (16010)  Patient Instructions: 1)  I am proud of you  for still not smoking. 2)  I am still not comfortable with the weight loss medicine - with your borderline diabetes, borderline blood  pressure I am concerned it could do bad things for your heart.  You are more than welcome to get a second opinion at a weight loss center. 3)  Start taking the asthma maintenance medicine to help decrease the use of your albuterol inhaler. 4)  Be sure to call [facility] to try to get in with them Prescriptions: METFORMIN HCL 500 MG TABS (METFORMIN HCL) 1 by mouth two times a day  #60 x 3   Entered and Authorized by:   Ancil Boozer  MD   Signed by:   Ancil Boozer  MD on 05/29/2009   Method used:   Handwritten   RxID:   [id number] HYDROCODONE-ACETAMINOPHEN 10-325 MG TABS (HYDROCODONE-ACETAMINOPHEN) 1 by mouth q6hrs as needed severe pain. Do not fill until 01/04/09  #60 x 1   Entered and Authorized by:   Ancil Boozer  MD   Signed by:   Ancil Boozer  MD on 05/29/2009   Method used:   Handwritten   RxID:   [id number] ALPRAZOLAM 1 MG TABS (ALPRAZOLAM) 1 by mouth three times a day as needed anxiety.  do not fill until 02/03/09  #90 x 3   Entered and Authorized by:   Ancil Boozer  MD   Signed by:   Ancil Boozer  MD on 05/29/2009   Method used:   Handwritten   RxID:   [id number] VENTOLIN HFA 108 (90 BASE) MCG/ACT AERS (ALBUTEROL SULFATE) 2 puffs q4-6 hrs as needed  #1 x 6   Entered and Authorized by:   Ancil Boozer  MD   Signed by:   Ancil Boozer  MD on 05/29/2009   Method used:   Handwritten   RxID:   [id number] HYDROXYZINE HCL 10 MG/5ML SYRP (HYDROXYZINE HCL) take 2.5 tsp by mouth 4x daily as needed.  Disp disp 250cc  #250 x 6   Entered and Authorized by:   Ancil Boozer  MD   Signed by:   Ancil Boozer  MD on 05/29/2009   Method used:   Handwritten   RxID:   [id number] OMEPRAZOLE 40 MG CPDR (OMEPRAZOLE) 1 by mouth two times a day for reflux  #60 x 6   Entered and Authorized by:   Ancil Boozer  MD   Signed by:   Ancil Boozer  MD on 05/29/2009   Method used:   Handwritten   RxID:   [id number] QVAR 80 MCG/ACT AERS (BECLOMETHASONE  DIPROPIONATE) 2 puffs two times a day for asthma maintenance  #1 x 3   Entered and Authorized  by:   Ancil Boozer  MD   Signed by:   Ancil Boozer  MD on 05/29/2009   Method used:   Print then Give to Patient   RxID:   [id number]   Prevention & Chronic Care Immunizations   Influenza vaccine: Fluvax Non-MCR  (12/04/2008)   Influenza vaccine due: Refused  (12/19/2007)    Tetanus booster: Not documented    Pneumococcal vaccine: Not documented  Other Screening   Pap smear: NEGATIVE FOR INTRAEPITHELIAL LESIONS OR MALIGNANCY.  (02/28/2008)   Pap smear due: 02/27/2009   Smoking status: quit  (05/29/2009)  Appended Document: A1c  5.5 %     OB Initial Intake Information    Race: White    Marital status: Married  Physical Examination  Vital Signs:  Last Ht: 63 (05/29/2009)  Complete Medication List: 1)  Omeprazole 40 Mg Cpdr (Omeprazole) .Marland Kitchen.. 1 by mouth two times a day for reflux 2)  Dilantin 100 Mg Caps (Phenytoin sodium extended) .... 2 by mouth two times a day for seizures. 3)  Hydroxyzine Hcl 10 Mg/16ml Syrp (Hydroxyzine hcl) .... Take 2.5 tsp by mouth 4x daily as needed.  disp disp 250cc 4)  Ventolin Hfa 108 (90 Base) Mcg/act Aers (Albuterol sulfate) .... 2 puffs q4-6 hrs as needed 5)  Flonase 50 Mcg/act Susp (Fluticasone propionate) .... 2 sprays each nostril daily for sinus congestion 6)  Qvar 80 Mcg/act Aers (Beclomethasone dipropionate) .... 2 puffs two times a day for asthma maintenance 7)  Naproxen 500 Mg Tabs (Naproxen) .Marland Kitchen.. 1 by mouth two times a day as needed back pain 8)  Keppra 500 Mg Tabs (Levetiracetam) .Marland Kitchen.. 1 by mouth two times a day for seizures 9)  Hydrocodone-acetaminophen 10-325 Mg Tabs (Hydrocodone-acetaminophen) .Marland Kitchen.. 1 by mouth q6hrs as needed severe pain. do not fill until 01/04/09 10)  Alprazolam 1 Mg Tabs (Alprazolam) .Marland Kitchen.. 1 by mouth three times a day as needed anxiety.  do not fill until 02/03/09 11)  Metformin Hcl 500 Mg Tabs (Metformin hcl)  .Marland Kitchen.. 1 by mouth two times a day  Laboratory Results   Blood Tests   Date/Time Received: May 29, 2009 10:08 AM  Date/Time Reported: May 29, 2009 10:45 AM   HGBA1C: 5.5%   (Normal Range: Non-Diabetic - 3-6%   Control Diabetic - 6-8%)  Comments: ...............test performed by......Marland KitchenBonnie A. [place], MLS (ASCP)cm
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
## 2010-03-18 NOTE — Letter (Signed)
Summary: Generic Letter  [facility]  [address]   [place], [place] [address]   Phone: [phone]  Fax: [phone]    09/08/2009  Lisa Simpson [address] [place], [place]  [address]  Dear Ms. Simpson,  I was unable to reach you by phone to discuss how to decrease your Keppra and Dilantin and how to start your new medicine of Topamax. I have included written instructions on how to do this based on our last conversation, if you have questions, please call me and we can review your medicine regimen, otherwise your taper doses are as follows:  Week 1 - Dilantin: Take 100mg  by mouth daily       Keppra: Take 250mg  (1/2 pill) by mouth daily      Start new medicine of Topamax 25mg  by mouth twice a day  Week 2-  Dilantin: Take 100mg  every other day for 7 days total          Keppra: Take 250mg  every other day for 7 days total          Topamax: Increase dose to 50mg  by mouth twice a day  Week 3 - STOP Dilantin and Keppra      Increase Topamax to 100mg  by mouth two times a day  I have sent your prescription of Topamax to [facility] pharmacy as you requested.  Please make a follow up appointment in 3 weeks so I can monitor your progress.  If you have any seizures, you need to go to the ER.         Sincerely,   Alvia Grove DO  Appended Document: Generic Letter mailed

## 2010-03-18 NOTE — Miscellaneous (Signed)
Summary: re neurology appointment.  Clinical Lists Changes   notified  patient April 5  that she will need to contact [facility] Neurology before appointment can be made. have called patient several times to follow up on this. called again today and she states she has not been able call then yet because of everything that has been going on with her since her fall.  she plans to contact them .advised patient that I will wait for her call in order to proceed with referral. Theresia Lo RN  June 12, 2009 12:22 PM  .

## 2010-03-18 NOTE — Assessment & Plan Note (Signed)
Summary: f/u MRI,df   Vital Signs:  Patient profile:   35 year old female Height:      63 inches Weight:      215.2 pounds BMI:     38.26 Temp:     98.6 degrees F oral Pulse rate:   79 / minute BP sitting:   121 / 78  (left arm) Cuff size:   regular  Vitals Entered By: Garen Grams LPN (June 14, 2009 2:47 PM) CC: f/u MRI/pain Is Patient Diabetic? No Pain Assessment Patient in pain? no        Primary Care Provider:  Ancil Boozer  MD  CC:  f/u MRI/pain.  History of Present Illness: MRI follow up: reviewed findings with patient that lucency on plain film resolved.  she was reassurred.  she states that she is no longer having any hip pain and cuts/scrapes on lower leg are healing.  she has been trying to scrape off scabs daily on lower leg from fall and has been putting peroxide on wound. denies fevers, did initially use more of her hydrocodone than typical but has refill of meds.    Habits & Providers  Alcohol-Tobacco-Diet     Tobacco Status: current     Tobacco Counseling: to quit use of tobacco products  Current Medications (verified): 1)  Omeprazole 20 Mg Cpdr (Omeprazole) .Marland Kitchen.. 1 By Mouth Two Times A Day For Reflux 2)  Dilantin 100 Mg Caps (Phenytoin Sodium Extended) .... 2 By Mouth Two Times A Day For Seizures. 3)  Hydroxyzine Hcl 10 Mg/54ml Syrp (Hydroxyzine Hcl) .... Take 2.5 Tsp By Mouth 4x Daily As Needed.  Disp Disp 250cc 4)  Ventolin Hfa 108 (90 Base) Mcg/act Aers (Albuterol Sulfate) .... 2 Puffs Q4-6 Hrs As Needed 5)  Flonase 50 Mcg/act Susp (Fluticasone Propionate) .... 2 Sprays Each Nostril Daily For Sinus Congestion 6)  Qvar 80 Mcg/act Aers (Beclomethasone Dipropionate) .... 2 Puffs Two Times A Day For Asthma Maintenance 7)  Keppra 500 Mg Tabs (Levetiracetam) .Marland Kitchen.. 1 By Mouth Two Times A Day For Seizures 8)  Hydrocodone-Acetaminophen 10-325 Mg Tabs (Hydrocodone-Acetaminophen) .Marland Kitchen.. 1 By Mouth Q6hrs As Needed Severe Pain. Do Not Fill Until 01/04/09 9)  Alprazolam  1 Mg Tabs (Alprazolam) .Marland Kitchen.. 1 By Mouth Three Times A Day As Needed Anxiety.  Do Not Fill Until 02/03/09 10)  Metformin Hcl 500 Mg Tabs (Metformin Hcl) .Marland Kitchen.. 1 By Mouth Two Times A Day 11)  Mobic 7.5 Mg Tabs (Meloxicam) .Marland Kitchen.. 1 Tab By Mouth Daily W/ Food X 10 Days 12)  Tramadol Hcl 50 Mg Tabs (Tramadol Hcl) .Marland Kitchen.. 1 Tab Every 6-8 Hours As Needed For Breakthrough Pain  Allergies (verified): No Known Drug Allergies  Past History:  PMH reviewed for relevance  Review of Systems       per HPI  Physical Exam  General:  VS Reviewed. Obese, well appearing, NAD.  Msk:  R hip exam Inspection- Excess adipose tissue, no ecchymosis, erythema, or obvious deformity FROM gait WNL Skin:  large 20 cm superficial abrasion on R lateral leg that is healing well and nearly all eschar has disappeared.     Impression & Recommendations:  Problem # 1:  HIP PAIN, RIGHT (ICD-719.45) Assessment Improved appears to be traumatic but no fracture, just brusing.  advised stop using peroxide and allow remainder of wound to heal spontaneously.   Her updated medication list for this problem includes:    Hydrocodone-acetaminophen 10-325 Mg Tabs (Hydrocodone-acetaminophen) .Marland Kitchen... 1 by mouth q6hrs as needed severe pain.  do not fill until 01/04/09    Mobic 7.5 Mg Tabs (Meloxicam) .Marland Kitchen... 1 tab by mouth daily w/ food x 10 days    Tramadol Hcl 50 Mg Tabs (Tramadol hcl) .Marland Kitchen... 1 tab every 6-8 hours as needed for breakthrough pain  Orders: [facility]- Est Level  3 (99213)  Problem # 2:  OTH NONSPC ABN FINDNG RAD&OTH EXM BODY STRUCTURE (ICD-793.99) Assessment: Improved reviewed mri findings.  reassurred patient.   Orders: [facility]- Est Level  3 ([address])  Complete Medication List: 1)  Omeprazole 20 Mg Cpdr (Omeprazole) .Marland Kitchen.. 1 by mouth two times a day for reflux 2)  Dilantin 100 Mg Caps (Phenytoin sodium extended) .... 2 by mouth two times a day for seizures. 3)  Hydroxyzine Hcl 10 Mg/71ml Syrp (Hydroxyzine hcl) .... Take 2.5 tsp by  mouth 4x daily as needed.  disp disp 250cc 4)  Ventolin Hfa 108 (90 Base) Mcg/act Aers (Albuterol sulfate) .... 2 puffs q4-6 hrs as needed 5)  Flonase 50 Mcg/act Susp (Fluticasone propionate) .... 2 sprays each nostril daily for sinus congestion 6)  Qvar 80 Mcg/act Aers (Beclomethasone dipropionate) .... 2 puffs two times a day for asthma maintenance 7)  Keppra 500 Mg Tabs (Levetiracetam) .Marland Kitchen.. 1 by mouth two times a day for seizures 8)  Hydrocodone-acetaminophen 10-325 Mg Tabs (Hydrocodone-acetaminophen) .Marland Kitchen.. 1 by mouth q6hrs as needed severe pain. do not fill until 01/04/09 9)  Alprazolam 1 Mg Tabs (Alprazolam) .Marland Kitchen.. 1 by mouth three times a day as needed anxiety.  do not fill until 02/03/09 10)  Metformin Hcl 500 Mg Tabs (Metformin hcl) .Marland Kitchen.. 1 by mouth two times a day 11)  Mobic 7.5 Mg Tabs (Meloxicam) .Marland Kitchen.. 1 tab by mouth daily w/ food x 10 days 12)  Tramadol Hcl 50 Mg Tabs (Tramadol hcl) .Marland Kitchen.. 1 tab every 6-8 hours as needed for breakthrough pain  Patient Instructions: 1)  Continue to try to work on things to get in with [facility]. Prescriptions: OMEPRAZOLE 20 MG CPDR (OMEPRAZOLE) 1 by mouth two times a day for reflux  #60 x 6   Entered and Authorized by:   Ancil Boozer  MD   Signed by:   Ancil Boozer  MD on 06/14/2009   Method used:   Electronically to        [id number] (retail)       [address]       [place], [place]  [address]       Ph: [phone] or [phone]       Fax: [phone]   RxID:   [id number]

## 2010-03-18 NOTE — Progress Notes (Signed)
Summary: refill  Phone Note Refill Request Call back at Home Phone [phone] Message from:  Patient  Refills Requested: Medication #1:  HYDROCODONE-ACETAMINOPHEN 10-325 MG TABS 1 by mouth q6hrs as needed severe pain. Do not fill until 01/04/09   Notes: pt is now out Initial call taken by: De Nurse,  Jul 04, 2009 9:41 AM  Follow-up for Phone Call        she had a refill for 06/28/09.  she doesn't need another refill until earliest 07/29/09. thanks Follow-up by: Ancil Boozer  MD,  Jul 04, 2009 10:37 AM  Additional Follow-up for Phone Call Additional follow up Details #1::        [facility] and [facility] got [facility]ill on 05/29/09 #15 & then again on 06/19/09 # 15.  So pharmacy says she is actually due for it.  Marcelino Duster [phone]. Additional Follow-up by: Clydell Hakim,  Jul 04, 2009 12:08 PM    Additional Follow-up for Phone Call Additional follow up Details #2::    okay.  scripts were written for #60 so not sure why she got #15.  please check to make sure they are checking both Alexsys Hodgin AND Magali Briel (she has had scripts in both names for some reason)  If they are checking both and she isn't getting more than #60/mo then we can refill Vicodin 5/500 from med list #60. Follow-up by: Ancil Boozer  MD,  Jul 05, 2009 8:36 AM  Additional Follow-up for Phone Call Additional follow up Details #3:: Details for Additional Follow-up Action Taken: spoke with pharmacist.  was actually filled for #60.  denied this refill.  Ms Rodena Medin has scheduled an appt for 5/23 for follow up and we will discuss this further at that visit. In the mean time pharmacist is investigating as well. Additional Follow-up by: Ancil Boozer  MD,  Jul 05, 2009 11:06 AM

## 2010-05-05 LAB — DIFFERENTIAL
Eosinophils Absolute: 0.3 10*3/uL (ref 0.0–0.7)
Monocytes Relative: 4 % (ref 3–12)
Neutro Abs: 9.2 10*3/uL — ABNORMAL HIGH (ref 1.7–7.7)

## 2010-05-05 LAB — CBC
HCT: 41.3 % (ref 36.0–46.0)
MCHC: 33.8 g/dL (ref 30.0–36.0)
MCV: 85.4 fL (ref 78.0–100.0)
RBC: 4.84 MIL/uL (ref 3.87–5.11)
RDW: 14.4 % (ref 11.5–15.5)
WBC: 13.4 10*3/uL — ABNORMAL HIGH (ref 4.0–10.5)

## 2010-05-05 LAB — POCT PREGNANCY, URINE: Preg Test, Ur: NEGATIVE

## 2010-05-13 ENCOUNTER — Emergency Department (HOSPITAL_COMMUNITY)
Admission: EM | Admit: 2010-05-13 | Discharge: 2010-05-13 | Disposition: A | Discharge status: Home / Self Care | Payer: Medicare Other | Attending: Emergency Medicine | Admitting: Emergency Medicine

## 2010-05-13 DIAGNOSIS — J45909 Unspecified asthma, uncomplicated: Secondary | ICD-10-CM | POA: Insufficient documentation

## 2010-05-13 DIAGNOSIS — R51 Headache: Secondary | ICD-10-CM | POA: Insufficient documentation

## 2010-05-13 DIAGNOSIS — G40909 Epilepsy, unspecified, not intractable, without status epilepticus: Secondary | ICD-10-CM | POA: Insufficient documentation

## 2010-05-13 DIAGNOSIS — M542 Cervicalgia: Secondary | ICD-10-CM | POA: Insufficient documentation

## 2010-05-13 DIAGNOSIS — Z79899 Other long term (current) drug therapy: Secondary | ICD-10-CM | POA: Insufficient documentation

## 2010-05-13 DIAGNOSIS — S139XXA Sprain of joints and ligaments of unspecified parts of neck, initial encounter: Secondary | ICD-10-CM | POA: Insufficient documentation

## 2010-05-23 LAB — URINALYSIS, ROUTINE W REFLEX MICROSCOPIC
Glucose, UA: NEGATIVE mg/dL
Nitrite: NEGATIVE
Protein, ur: NEGATIVE mg/dL
Urobilinogen, UA: 0.2 mg/dL (ref 0.0–1.0)

## 2010-05-23 LAB — CBC
HCT: 37.8 % (ref 36.0–46.0)
MCHC: 34 g/dL (ref 30.0–36.0)
Platelets: 285 10*3/uL (ref 150–400)
RBC: 4.49 MIL/uL (ref 3.87–5.11)
RDW: 14.3 % (ref 11.5–15.5)
WBC: 15.5 10*3/uL — ABNORMAL HIGH (ref 4.0–10.5)

## 2010-05-23 LAB — POCT I-STAT, CHEM 8
Calcium, Ion: 1.13 mmol/L (ref 1.12–1.32)
Glucose, Bld: 94 mg/dL (ref 70–99)
Potassium: 3.2 mEq/L — ABNORMAL LOW (ref 3.5–5.1)
Sodium: 139 mEq/L (ref 135–145)
TCO2: 24 mmol/L (ref 0–100)

## 2010-05-23 LAB — POCT PREGNANCY, URINE: Preg Test, Ur: NEGATIVE

## 2010-05-23 LAB — DIFFERENTIAL
Lymphocytes Relative: 22 % (ref 12–46)
Monocytes Relative: 3 % (ref 3–12)
Neutrophils Relative %: 73 % (ref 43–77)

## 2010-06-02 ENCOUNTER — Emergency Department (HOSPITAL_COMMUNITY)
Admission: EM | Admit: 2010-06-02 | Discharge: 2010-06-02 | Disposition: A | Discharge status: Home / Self Care | Payer: Medicare Other | Attending: Emergency Medicine | Admitting: Emergency Medicine

## 2010-06-02 ENCOUNTER — Emergency Department (HOSPITAL_COMMUNITY): Payer: Medicare Other

## 2010-06-02 DIAGNOSIS — R569 Unspecified convulsions: Secondary | ICD-10-CM | POA: Insufficient documentation

## 2010-06-02 DIAGNOSIS — R05 Cough: Secondary | ICD-10-CM | POA: Insufficient documentation

## 2010-06-02 DIAGNOSIS — R059 Cough, unspecified: Secondary | ICD-10-CM | POA: Insufficient documentation

## 2010-06-02 DIAGNOSIS — J40 Bronchitis, not specified as acute or chronic: Secondary | ICD-10-CM | POA: Insufficient documentation

## 2010-06-02 DIAGNOSIS — R0789 Other chest pain: Secondary | ICD-10-CM | POA: Insufficient documentation

## 2010-06-02 DIAGNOSIS — J45909 Unspecified asthma, uncomplicated: Secondary | ICD-10-CM | POA: Insufficient documentation

## 2010-06-02 DIAGNOSIS — J189 Pneumonia, unspecified organism: Secondary | ICD-10-CM | POA: Insufficient documentation

## 2010-06-04 ENCOUNTER — Other Ambulatory Visit: Payer: Self-pay | Admitting: *Deleted

## 2010-06-04 DIAGNOSIS — R918 Other nonspecific abnormal finding of lung field: Secondary | ICD-10-CM

## 2010-06-04 DIAGNOSIS — J189 Pneumonia, unspecified organism: Secondary | ICD-10-CM

## 2010-06-05 ENCOUNTER — Other Ambulatory Visit: Payer: Self-pay | Admitting: Family Medicine

## 2010-06-05 ENCOUNTER — Ambulatory Visit
Admission: RE | Admit: 2010-06-05 | Discharge: 2010-06-05 | Disposition: A | Discharge status: Home / Self Care | Payer: Medicare Other | Source: Ambulatory Visit | Attending: *Deleted | Admitting: *Deleted

## 2010-06-05 DIAGNOSIS — R918 Other nonspecific abnormal finding of lung field: Secondary | ICD-10-CM

## 2010-06-05 DIAGNOSIS — J189 Pneumonia, unspecified organism: Secondary | ICD-10-CM

## 2010-06-05 MED ORDER — IOHEXOL 300 MG/ML  SOLN
75.0000 mL | Freq: Once | INTRAMUSCULAR | Status: AC | PRN
  Administered 2010-06-05: 75 mL via INTRAVENOUS

## 2010-07-01 NOTE — H&P (Signed)
NAMELANA, FLAIM             ACCOUNT NO.:  [id number]   MEDICAL RECORD NO.:  [id number]          PATIENT TYPE:  INP   LOCATION:  9168                          FACILITY:  [facility]   PHYSICIAN:  Naima A. Dillard, M.D. DATE OF BIRTH:  March 13, 1975   DATE OF ADMISSION:  10/29/2006  DATE OF DISCHARGE:                              HISTORY & PHYSICAL   HISTORY OF PRESENT ILLNESS:  Lisa Simpson is a 35 year old gravida 5, para  2-0-2-2 at 38 weeks, who presented with uterine contractions every 3-5  minutes for several days on and off.  Cervix has been 2 cm in the  office.  Pregnancy has been remarkable for:  (1) History of seizures  with anxiety, on Vistaril, (2) history of asthma, on albuterol, (3)  history of PIH, (4) history of pyelonephritis, (5) patient is a smoker.   PRENATAL LABORATORY DATA:  Blood type is A positive, Rh-antibody  negative.  VDRL nonreactive.  Rubella titer positive.  Hepatitis B  surface antigen negative.  HIV was nonreactive.  Hemoglobin upon entry  into practice was 13.9; it was within normal limits at 28 weeks.  Glucola was normal at 100.  First trimester screen was done and was  normal.  AFP was done and was normal.  Group B strep culture was  negative at 36 weeks.   HISTORY OF PRESENT PREGNANCY:  The patient entered care at approximately  10 weeks.  Pap had been done in November 2007 and cultures had been done  in February that were negative.  Cystic fibrosis was negative from a  previous pregnancy.  She had some asthma symptoms at 13 weeks.  She had  nuchal translucency scan that was normal.  She had an AFP that was  normal.  Her husband had a vasectomy when the patient was approximately  16 weeks.  The patient had a history of a nonproductive cough and was on  Pulmicort and albuterol.  She had a chest x-ray that was negative.  She  had some contractions at 19-20 weeks, was given terbutaline, but elected  not to take that.  She was seen in M[facility] Admissions  Unit at 22  weeks.  She had a Glucola at 28 weeks; it was normal.  She had an  ultrasound at 30 weeks showing normal growth and fluid.  By 32 weeks,  she had some issues with pelvic pain and had been using a fair amount of  narcotic pain medication and had been getting these from other  physicians as well.  She was advised at that time she needed to stop  calling for multiple pain medicine prescriptions, which she did.  Group  B strep culture was negative at 36 weeks.   OBSTETRICAL HISTORY:  In 2000, she had a vacuum-assisted vaginal birth  of a female infant, weight 6 pounds 3 ounces, at 42 weeks; she was in  labor 9 hours.  She had no anesthesia.  She had second-stage bradycardia  and elevated blood pressure during that pregnancy.  In 2001, she had a  miscarriage.  In 2005, she had a vaginal birth of a female  infant, weight  5 pounds 9 ounces, at 38 weeks; she was in labor 1-1/2 hours.  She had  no anesthesia.  In 2006, she had another miscarriage.  She had some  borderline gestational diabetes in both pregnancies, but none this time.  She had some pregnancy-induced hypertension with her first pregnancy.   MEDICAL HISTORY:  She became pregnant on pills with the previous  pregnancy and this time.  She reports the usual childhood illnesses.  She had a questionable heart murmur in the past, but had a negative  evaluation in a previous pregnancy.  She does have asthma for which uses  an inhaler.  She does have some chronic constipation.  She had a history  of pyelonephritis x1 in the past.  She did have a history of anxiety-  related seizures and has been on Vistaril.   SURGICAL HISTORY:  Tonsillectomy at age 59.   FAMILY HISTORY:  Her maternal grandfather and paternal grandfather had  MIs.  Her mother has chronic hypertension.  Her father had varicosities  and clots.  Her daughter and father have asthma.  Paternal grandfather  had insulin-dependent diabetes.  Her mother has epilepsy and  her first  cousin has epilepsy.  Maternal grandmother had lung cancer.  Her  paternal uncle had prostate cancer.  Her mother and father have been  treated for depression.   GENETIC HISTORY:  Father of the baby's father was a twin.   SOCIAL HISTORY:  The patient is married to the father of the baby; he is  involved and supportive; his name is Building services engineer.  The patient is  high-school-educated; she is a homemaker.  Her husband is high-school-  educated; he is employed in Print production planner.  She has been followed  by the certified nurse midwife service at [facility].  She  denies any alcohol or drug use during this pregnancy.   PHYSICAL EXAMINATION:  VITAL SIGNS:  Stable.  The patient is afebrile.  HEENT:  Within normal limits.  LUNGS:  Bilateral breath sounds are clear.  HEART:  Regular rate and rhythm without murmur.  BREASTS:  Soft and nontender.  ABDOMEN:  Fundal height is approximately 37 cm.  Estimated fetal weight  is 6 to 6-1/2 pounds.  Uterine contractions have been initially every 3-  5 minutes and now are somewhat irregular.  Fetal heart rate is reactive  and no decelerations.  PELVIC:  Cervix is 3+, 80%, vertex at a -3 station.  EXTREMITIES:  Deep tendon reflexes are 2+ without clonus. There is a  trace edema noted.   IMPRESSION:  1. Intrauterine pregnancy at 38 weeks.  2. Early labor.  3. Group B streptococcus negative.  4. Anxiety-related seizures, none during pregnancy.   PLAN:  1. Admit to birthing suite per consult with Dr. Normand Sloop as attending      physician.  2. Routine certified nurse midwife orders.  3. Will observe at present for labor status.  Will augment as      necessary.  Plan artificial rupture of membranes as labor advances      and vertex descends.      Renaldo Reel Emilee Hero, C.N.M.      Naima A. Normand Sloop, M.D.  Electronically Signed    VLL/MEDQ  D:  10/29/2006  T:  10/29/2006  Job:  [id number]
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
## 2010-07-04 NOTE — Op Note (Signed)
[facility]  Patient:    SHRITA, THIEN                       MRN: [id number] Proc. Date: 04/28/99 Adm. Date:  [id number] Attending:  Cleatrice Burke                           Operative Report  PREOPERATIVE DIAGNOSIS:       Repetitive late decellerations.  POSTOPERATIVE DIAGNOSIS:      Repetitive late decellerations.  OPERATION:                    Low vacuum-assisted vaginal delivery.  SURGEON:                      Cecilio Asper, M.D.  ASSISTANT:                    Mack Guise, C.N.M.  ANESTHESIA:                   Local.  ESTIMATED BLOOD LOSS:         350 cc.  FINDINGS:                     Thick meconium stained fluid.  Viable female infant named Destiny.  Apgars 4 and 9.  Cord gas 6.95.  Placenta intact with three vessel cord.  Episiotomy, midline with fourth degree extension.  COMPLICATIONS:                None.  INDICATIONS:                  The patient is a 35 year old, gravida 1, para 0, ho presented at 15 weeks estimated gestational age and admitted for induction of labor secondary to a nonreactive fetal heart rate tracing in the [facility] office.  The patient was initially 2 cm and 80% effaced. She was given Cytotec 25 mcg and spontaneously progressed to 3 cm, 90% effaced.  Artificial rupture of membranes was performed and thick meconium stained fluid as noted by the certified nurse midwife.  Subsequently, the patient rapidly progressed to 10 cm of dilatation.  The fetal heart rate tracing began showing deep variable decellerations.  Beat to beat variability was decreased as compared to earlier n the evening.  At this point, I was called to assess the patient.  Upon entering the room, the patient was 10 cm dilated with the vertex at +1 to +2 station.  The vertex was direct OA.  Maternal pushing effort was adequate but inconsistent. Contractions were palpated to be strong.   The pelvis was felt to be adequate. The bladder was drained of urine with a red Robinson catheter.  I reviewed with the  patient her options for delivery.  These included forceps versus vacuum.  I recommended the vacuum since it may provide less discomfort since the patient did not have an epidural for anesthesia.  The patient consented.  DESCRIPTION OF PROCEDURE:     The Kiwi vaccum was placed on the bony vertex. Approximately 600 mmHg pressure was applied.  Suction could not be maintained. A second attempt using the Kiwi was unsuccessful again secondary to inability to maintain suction.  At this point the patient was aggressively encouraged to push. A midline episiotomy was cut with fourth degree extension.  Subsequently, the patient delivered.  The infant was DeLee suctioned on the perineum.  20 cc of copious green meconium was obtained.  The infant was then bulb suctioned on the  perineum.  Anterior and posterior shoulders were then delivered.  The cord was doubly clamped and cut and the infant was taken to the warmer where she was cared for by the NICU team.  Cord gas was obtained.  The placenta was then delivered without difficulty.  Inspection of the vagina and perineum only revealed the midline episiotomy with fourth degree extension.  This was repaired with #3-0 Vicryl using normal technique.  Hemostasis was then assured.  The patient tolerated the procedure well.  The infant and mother are bonding well in recovery. DD:  04/29/99 TD:  04/29/99 Job: [id number]

## 2010-07-04 NOTE — Procedures (Signed)
CLINICAL INFORMATION:  This patient has been thought to have a history of  seizures.   TECHNICAL DESCRIPTION:  This EEG was recorded during the awake state.  There  was no evidence of any stage II sleep present.  The background activity  shows 12 Hz rhythms with higher amplitude seen in the posterior head regions  bilaterally.  Photic stimulation was performed which did not produce  evidence of a driving response.  Hyperventilation testing was performed  which produced no focal abnormalities and no evidence of any epileptiform  activity present.  There was no stage II sleep present during this EEG.  There was no evidence of any epileptiform activity present.   IMPRESSION:  This is a normal EEG during the awake state.  Some drowsiness  was recorded but there was no stage II sleep present.  Should the  possibility of seizures be sought then consideration of a sleep-deprived EEG  may be in order.      [id number]  D:  03/25/2004 20:48:08  T:  03/25/2004 22:42:18  Job #:  [id number]

## 2010-07-04 NOTE — Discharge Summary (Signed)
[facility]  Patient:    Lisa Simpson, Lisa Simpson                       MRN: [id number] Adm. Date:  [id number] Disc. Date: 04/30/99 Attending:  Cleatrice Burke Dictator:   Erin Sons, C.N.M.                           Discharge Summary  ADMISSION DIAGNOSES:          1. Intrauterine pregnancy at 41 weeks.                               2. Nonreactive fetal heart rate.  DISCHARGE DIAGNOSES:          1. Intrauterine pregnancy at term.                               2. Repetitive late decellerations.  PROCEDURE:                    1) Cytotec induction of labor.  2) Sterile water papules.  3) Low vacuum-assisted vaginal delivery.  HOSPITAL COURSE:              Lisa Simpson is a 35 year old, gravida 1, para 0, at 57 weeks who was admitted for induction on April 28, 1999, in the afternoon secondary to being 41 weeks and having a nonreactive fetal heart rate in the office. Cervix at that time was 2 cm, 80%, and vertex at -1 station.  Pregnancy had been remarkable for:  1) History of smoking.  2) History of asthma.  3) History of seizures associated with panic attacks in the past.  Cytotec was placed per the  vagina in the afternoon of March 12.  Uterine contractions began soon after that. Sterile water papules were performed at approximately 7:30 with relief of back labor.  The patient then progressed rapidly to 10 cm dilated at 9 p.m.  Fetus was at +1 station.  The patient had not had any analgesia or anesthesia.  The vertex was in a direct OA position.  There was thick meconium stained fluid noted. There were repetitive late decellerations noted.  Pushing force was adequate, but inconsistent.  Cecilio Asper, M.D. was called and consulted regarding his issue.  She reviewed with the patient the risks and benefits of operative vaginal delivery and a vacuum-assisted vaginal delivery was offered and the patient did  elect to proceed with this.  The  Kiwi vacuum extractor was applied and birth occurred.  On initial attempt suction could not be maintained.  A second attempt was made secondary to inability to maintain suction.  The patient was aggressively encouraged to push and a midline episiotomy was cut with a fourth degree extension. The infant did then deliver spontaneously.  There was 20 cc of copious green meconium fluid noted in the bulb suction.  NICU team was present and the infant was handed off to the awaiting team.  Cord gas was obtained which was 6.95.  The infant was taken to the fullterm nursery.  A midline episiotomy with fourth degree extension was noted.  This was repaired in the usual fashion by Cecilio Asper, M.D.  The patient tolerated the procedure well and was taken to he recovery room in good condition.  The infant  remained in the fullterm nursery. By postpartum day #1, the patient was doing well.  Her hemoglobin was 10.7 down from 13.  She was breastfeeding and planned Micronor for birth control.  Social work was consulted secondary to history of seizures associated with panic attacks and no  specific issues were identified.  By postpartum day #2, the patient was up ad lib. She was ready to go home.  She had been instructed to use Colace and the usual perineal care techniques.  The patient was deemed to have received the full benefit of her hospital stay and was discharged home.  DISCHARGE INSTRUCTIONS:       Per [facility] and Gynecology handout.  DISCHARGE MEDICATIONS:        1. Motrin 600 mg p.o. q.6h. p.r.n. pain.                               2. Tylox one to two p.o. q.3-4h. p.r.n. pain.                               3. Micronor one p.o. q.d.                               4. Prenatal vitamin one p.o. q.d.  FOLLOW-UP:                    Discharge follow-up will occur in six weeks at [facility] and Gynecology. DD:  04/30/99 TD:  04/30/99 Job:  [id number]
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
## 2010-07-04 NOTE — Procedures (Signed)
CLINICAL HISTORY:  This is a sleep deprived recording.  The patient is a 35-  year-old patient who is being evaluated for seizure type activity with  hyperventilation.  Last episode 2 weeks prior to this recording.  Again,  this is sleep deprived EEG recording.  No skull defects were noted.  The  patient is on Adapex.   EEG classification normal awake and asleep.   PROCEDURE:  The background rhythm consists of a very well-modulated, low to  medium amplitude with a rate of 9 Hz.  The background activities are  reactive to eye opening and closure.  As the record progresses, the patient  initially is in an awakened state, but gradually enters a drowsy state with  a drop out of background rhythm activities and onset of 7 Hz theta frequency  slowing seen.  The patient does appear to enter a stage II sleep with vertex  sharp wave activity and sleep spindles seen.  The patient spends much of the  recording in the sleeping state, but is realerted toward the end of  recording.  Photic stimulation is performed at that time resulting in a  bilateral and symmetric flow driving response.  Hyperventilation is also  performed resulting in a mild build up background rhythm activity without  significant slowing seen.  At no time during the recording does there appear  to be evidence of actual spikes or spike wave discharges.  EKG monitor shows  no evidence of cardiac arrhythmias with a heart rate of 60.   IMPRESSION:  This is a normal sleep deprived EEG recording in the awake and  sleeping state.  No evidence of ictal or interictal discharges were seen.      ZOX:WRUE  D:  04/07/2004 12:27:49  T:  04/07/2004 15:13:17  Job #:  [id number]

## 2010-09-16 ENCOUNTER — Emergency Department (HOSPITAL_COMMUNITY)
Admission: EM | Admit: 2010-09-16 | Discharge: 2010-09-17 | Disposition: A | Discharge status: Home / Self Care | Payer: Medicare Other | Attending: Emergency Medicine | Admitting: Emergency Medicine

## 2010-09-16 DIAGNOSIS — M79609 Pain in unspecified limb: Secondary | ICD-10-CM | POA: Insufficient documentation

## 2010-09-16 DIAGNOSIS — W230XXA Caught, crushed, jammed, or pinched between moving objects, initial encounter: Secondary | ICD-10-CM | POA: Insufficient documentation

## 2010-09-16 DIAGNOSIS — R569 Unspecified convulsions: Secondary | ICD-10-CM | POA: Insufficient documentation

## 2010-09-16 DIAGNOSIS — S60229A Contusion of unspecified hand, initial encounter: Secondary | ICD-10-CM | POA: Insufficient documentation

## 2010-09-16 DIAGNOSIS — M7989 Other specified soft tissue disorders: Secondary | ICD-10-CM | POA: Insufficient documentation

## 2010-09-16 DIAGNOSIS — Y92009 Unspecified place in unspecified non-institutional (private) residence as the place of occurrence of the external cause: Secondary | ICD-10-CM | POA: Insufficient documentation

## 2010-09-17 ENCOUNTER — Emergency Department (HOSPITAL_COMMUNITY): Payer: Medicare Other

## 2010-10-23 ENCOUNTER — Other Ambulatory Visit: Payer: Self-pay | Admitting: Family Medicine

## 2010-10-30 ENCOUNTER — Other Ambulatory Visit: Payer: Self-pay | Admitting: Family Medicine

## 2010-11-28 LAB — RPR: RPR Ser Ql: NONREACTIVE

## 2010-11-28 LAB — CBC
HCT: 29 — ABNORMAL LOW
HCT: 35.3 — ABNORMAL LOW
Hemoglobin: 10.1 — ABNORMAL LOW
MCHC: 34.2
MCHC: 34.8
MCV: 81.9
MCV: 82.4
Platelets: 272
Platelets: 317
RBC: 3.55 — ABNORMAL LOW
RDW: 14.9 — ABNORMAL HIGH
RDW: 15 — ABNORMAL HIGH
WBC: 19 — ABNORMAL HIGH

## 2010-12-01 LAB — STREP B DNA PROBE
Strep Group B Ag: NEGATIVE
Strep Group B Ag: NEGATIVE

## 2010-12-01 LAB — URINALYSIS, ROUTINE W REFLEX MICROSCOPIC
Bilirubin Urine: NEGATIVE
Glucose, UA: NEGATIVE
Hgb urine dipstick: NEGATIVE
Ketones, ur: NEGATIVE
Ketones, ur: NEGATIVE
Nitrite: NEGATIVE
Nitrite: NEGATIVE
Protein, ur: NEGATIVE
Protein, ur: NEGATIVE
Specific Gravity, Urine: 1.02
Urobilinogen, UA: 0.2
Urobilinogen, UA: 0.2
pH: 7

## 2010-12-01 LAB — FETAL FIBRONECTIN
Fetal Fibronectin: NEGATIVE
Fetal Fibronectin: NEGATIVE

## 2010-12-01 LAB — URINE CULTURE
Colony Count: NO GROWTH
Culture: NO GROWTH

## 2010-12-01 LAB — GC/CHLAMYDIA PROBE AMP, GENITAL
Chlamydia, DNA Probe: NEGATIVE
Chlamydia, DNA Probe: NEGATIVE
GC Probe Amp, Genital: NEGATIVE

## 2010-12-01 LAB — URINE MICROSCOPIC-ADD ON

## 2010-12-03 LAB — URINALYSIS, ROUTINE W REFLEX MICROSCOPIC
Bilirubin Urine: NEGATIVE
Hgb urine dipstick: NEGATIVE
Ketones, ur: NEGATIVE
Nitrite: NEGATIVE
Protein, ur: NEGATIVE
Specific Gravity, Urine: <1.005 — ABNORMAL LOW
Urobilinogen, UA: 0.2

## 2010-12-03 LAB — FETAL FIBRONECTIN: Fetal Fibronectin: NEGATIVE

## 2010-12-03 LAB — WET PREP, GENITAL: Trich, Wet Prep: NONE SEEN

## 2010-12-03 LAB — URINE MICROSCOPIC-ADD ON

## 2010-12-04 LAB — URINALYSIS, ROUTINE W REFLEX MICROSCOPIC
Glucose, UA: 100 — AB
Ketones, ur: NEGATIVE
Nitrite: NEGATIVE
Protein, ur: NEGATIVE
Urobilinogen, UA: 0.2

## 2010-12-04 LAB — WET PREP, GENITAL
Clue Cells Wet Prep HPF POC: NONE SEEN
Trich, Wet Prep: NONE SEEN

## 2010-12-04 LAB — URINE MICROSCOPIC-ADD ON: RBC / HPF: NONE SEEN

## 2010-12-27 ENCOUNTER — Other Ambulatory Visit: Payer: Self-pay | Admitting: Family Medicine

## 2011-02-10 ENCOUNTER — Emergency Department (HOSPITAL_COMMUNITY)
Admission: EM | Admit: 2011-02-10 | Discharge: 2011-02-11 | Disposition: A | Discharge status: Home / Self Care | Payer: Medicare Other | Attending: Emergency Medicine | Admitting: Emergency Medicine

## 2011-02-10 ENCOUNTER — Encounter: Payer: Self-pay | Admitting: *Deleted

## 2011-02-10 DIAGNOSIS — K5900 Constipation, unspecified: Secondary | ICD-10-CM | POA: Insufficient documentation

## 2011-02-10 DIAGNOSIS — R109 Unspecified abdominal pain: Secondary | ICD-10-CM | POA: Insufficient documentation

## 2011-02-10 DIAGNOSIS — D72829 Elevated white blood cell count, unspecified: Secondary | ICD-10-CM | POA: Insufficient documentation

## 2011-02-10 HISTORY — DX: Unspecified convulsions: R56.9

## 2011-02-10 NOTE — ED Notes (Addendum)
Pt in c/o suprapubic abd pain x2 days, denies urinary or vaginal symptoms, pt also c/o constipation, states last BM was 19 days ago, states she has a history of same

## 2011-02-11 ENCOUNTER — Emergency Department (HOSPITAL_COMMUNITY): Payer: Medicare Other

## 2011-02-11 LAB — DIFFERENTIAL
Basophils Absolute: 0 10*3/uL (ref 0.0–0.1)
Eosinophils Relative: 1 % (ref 0–5)
Lymphocytes Relative: 31 % (ref 12–46)
Neutrophils Relative %: 64 % (ref 43–77)

## 2011-02-11 LAB — URINALYSIS, ROUTINE W REFLEX MICROSCOPIC
Glucose, UA: NEGATIVE mg/dL
Hgb urine dipstick: NEGATIVE
Specific Gravity, Urine: 1.023 (ref 1.005–1.030)

## 2011-02-11 LAB — URINE MICROSCOPIC-ADD ON

## 2011-02-11 LAB — COMPREHENSIVE METABOLIC PANEL
ALT: 10 U/L (ref 0–35)
AST: 10 U/L (ref 0–37)
CO2: 25 mEq/L (ref 19–32)
Calcium: 9.6 mg/dL (ref 8.4–10.5)
Sodium: 136 mEq/L (ref 135–145)
Total Protein: 7.5 g/dL (ref 6.0–8.3)

## 2011-02-11 LAB — POCT PREGNANCY, URINE: Preg Test, Ur: NEGATIVE

## 2011-02-11 LAB — CBC
MCV: 81.8 fL (ref 78.0–100.0)
Platelets: 337 10*3/uL (ref 150–400)
RDW: 14.8 % (ref 11.5–15.5)
WBC: 15.6 10*3/uL — ABNORMAL HIGH (ref 4.0–10.5)

## 2011-02-11 MED ORDER — ONDANSETRON HCL 4 MG/2ML IJ SOLN
4.0000 mg | Freq: Once | INTRAMUSCULAR | Status: AC
  Administered 2011-02-11: 4 mg via INTRAVENOUS
  Filled 2011-02-11: qty 2

## 2011-02-11 MED ORDER — HYDROMORPHONE HCL PF 1 MG/ML IJ SOLN
1.0000 mg | Freq: Once | INTRAMUSCULAR | Status: AC
  Administered 2011-02-11: 1 mg via INTRAVENOUS
  Filled 2011-02-11: qty 1

## 2011-02-11 MED ORDER — CIPROFLOXACIN HCL 500 MG PO TABS
500.0000 mg | ORAL_TABLET | Freq: Once | ORAL | Status: AC
  Administered 2011-02-11: 500 mg via ORAL
  Filled 2011-02-11: qty 1

## 2011-02-11 MED ORDER — METRONIDAZOLE 500 MG PO TABS
500.0000 mg | ORAL_TABLET | Freq: Once | ORAL | Status: AC
  Administered 2011-02-11: 500 mg via ORAL
  Filled 2011-02-11: qty 1

## 2011-02-11 MED ORDER — METRONIDAZOLE 500 MG PO TABS: 500.0000 mg | ORAL_TABLET | Freq: Two times a day (BID) | ORAL | Status: AC

## 2011-02-11 MED ORDER — CIPROFLOXACIN HCL 500 MG PO TABS: 500.0000 mg | ORAL_TABLET | Freq: Three times a day (TID) | ORAL | Status: AC

## 2011-02-11 MED ORDER — IOHEXOL 300 MG/ML  SOLN
100.0000 mL | Freq: Once | INTRAMUSCULAR | Status: AC | PRN
  Administered 2011-02-11: 100 mL via INTRAVENOUS

## 2011-02-11 MED ORDER — OXYCODONE-ACETAMINOPHEN 5-325 MG PO TABS: 1.0000 | ORAL_TABLET | ORAL | Status: AC | PRN

## 2011-02-11 NOTE — ED Provider Notes (Signed)
History     CSN: [id number]  Arrival date & time 02/10/11  2309   First MD Initiated Contact with Patient 02/11/11 0040      Chief Complaint  Patient presents with  . Abdominal Pain    (Consider location/radiation/quality/duration/timing/severity/associated sxs/prior treatment) Patient is a 35 y.o. female presenting with abdominal pain. The history is provided by the patient.  Abdominal Pain The primary symptoms of the illness include abdominal pain.  Pain started approximately 24 hours ago. It is located in the bilateral flank and radiates across the lower abdomen. Pain is sharp and crampy. Pain is severe and she rates it at 10 out of 10. She did have some nausea and vomiting. She has not had any diarrhea and states in fact she has not had a bowel movement in 19 days. She states this is not unusual for her. No fever, chills, sweats. She denies dysuria. Last menstrual period was November 15 but was abnormal and that it was shorter and lighter than normal. She states there is no chance of pregnancy because her husband has had a vasectomy. He has taken in with slight, partial relief of pain.  Past Medical History  Diagnosis Date  . Seizures   . Asthma     History reviewed. No pertinent past surgical history.  History reviewed. No pertinent family history.  History  Substance Use Topics  . Smoking status: Current Everyday Smoker  . Smokeless tobacco: Not on file  . Alcohol Use: No    OB History    Grav Para Term Preterm Abortions TAB SAB Ect Mult Living                  Review of Systems  Gastrointestinal: Positive for abdominal pain.  All other systems reviewed and are negative.    Allergies  Review of patient's allergies indicates no known allergies.  Home Medications  No current outpatient prescriptions on file.  BP 123/62  Pulse 75  Temp(Src) 97.9 F (36.6 C) (Oral)  Resp 20  SpO2 100%  Physical Exam  Nursing note and vitals reviewed.  35 year old  female who is resting comfortably and in no acute distress. Vital signs are normal. Oxygen saturation is 100% which is normal. Head is normocephalic and atraumatic. PERRLA, EOMI. There is no scleral icterus. Mucous members are moist. Neck is supple without adenopathy or JVD. Back is nontender. There is moderate bilateral CVA tenderness. Lungs are clear without any rales, wheezes, rhonchi. Heart is regular rate and rhythm without murmur. Abdomen is soft, flat with moderate tenderness across the lower abdomen. There is no rebound or guarding. Peristalsis is diminished. Extremities have no cyanosis or edema, full range of motion is present. I-STAT without rash. Neurologic: Mental status is normal, cranial nerves are intact, there no focal motor or sensory deficits. Psychiatric: No abnormalities of mood or affect.  ED Course  Procedures (including critical care time)   Labs Reviewed  CBC  DIFFERENTIAL  COMPREHENSIVE METABOLIC PANEL  LIPASE, BLOOD  URINALYSIS, ROUTINE W REFLEX MICROSCOPIC  POCT PREGNANCY, URINE   No results found. Results for orders placed during the hospital encounter of 02/10/11  CBC      Component Value Range   WBC 15.6 (*) 4.0 - 10.5 (K/uL)   RBC 5.00  3.87 - 5.11 (MIL/uL)   Hemoglobin 13.5  12.0 - 15.0 (g/dL)   HCT 40.9  81.1 - 91.4 (%)   MCV 81.8  78.0 - 100.0 (fL)   MCH 27.0  26.0 - 34.0 (  pg)   MCHC 33.0  30.0 - 36.0 (g/dL)   RDW 74.2  59.5 - 63.8 (%)   Platelets 337  150 - 400 (K/uL)  DIFFERENTIAL      Component Value Range   Neutrophils Relative 64  43 - 77 (%)   Neutro Abs 10.0 (*) 1.7 - 7.7 (K/uL)   Lymphocytes Relative 31  12 - 46 (%)   Lymphs Abs 4.8 (*) 0.7 - 4.0 (K/uL)   Monocytes Relative 4  3 - 12 (%)   Monocytes Absolute 0.7  0.1 - 1.0 (K/uL)   Eosinophils Relative 1  0 - 5 (%)   Eosinophils Absolute 0.2  0.0 - 0.7 (K/uL)   Basophils Relative 0  0 - 1 (%)   Basophils Absolute 0.0  0.0 - 0.1 (K/uL)  COMPREHENSIVE METABOLIC PANEL      Component  Value Range   Sodium 136  135 - 145 (mEq/L)   Potassium 4.0  3.5 - 5.1 (mEq/L)   Chloride 104  96 - 112 (mEq/L)   CO2 25  19 - 32 (mEq/L)   Glucose, Bld 100 (*) 70 - 99 (mg/dL)   BUN 10  6 - 23 (mg/dL)   Creatinine, Ser 7.56  0.50 - 1.10 (mg/dL)   Calcium 9.6  8.4 - 43.3 (mg/dL)   Total Protein 7.5  6.0 - 8.3 (g/dL)   Albumin 3.5  3.5 - 5.2 (g/dL)   AST 10  0 - 37 (U/L)   ALT 10  0 - 35 (U/L)   Alkaline Phosphatase 84  39 - 117 (U/L)   Total Bilirubin 0.1 (*) 0.3 - 1.2 (mg/dL)   GFR calc non Af Amer >90  >90 (mL/min)   GFR calc Af Amer >90  >90 (mL/min)  LIPASE, BLOOD      Component Value Range   Lipase 62 (*) 11 - 59 (U/L)  URINALYSIS, ROUTINE W REFLEX MICROSCOPIC      Component Value Range   Color, Urine YELLOW  YELLOW    APPearance CLOUDY (*) CLEAR    Specific Gravity, Urine 1.023  1.005 - 1.030    pH 5.0  5.0 - 8.0    Glucose, UA NEGATIVE  NEGATIVE (mg/dL)   Hgb urine dipstick NEGATIVE  NEGATIVE    Bilirubin Urine NEGATIVE  NEGATIVE    Ketones, ur NEGATIVE  NEGATIVE (mg/dL)   Protein, ur NEGATIVE  NEGATIVE (mg/dL)   Urobilinogen, UA 0.2  0.0 - 1.0 (mg/dL)   Nitrite NEGATIVE  NEGATIVE    Leukocytes, UA SMALL (*) NEGATIVE   POCT PREGNANCY, URINE      Component Value Range   Preg Test, Ur NEGATIVE    URINE MICROSCOPIC-ADD ON      Component Value Range   Squamous Epithelial / LPF MANY (*) RARE    WBC, UA 0-2  <3 (WBC/hpf)   RBC / HPF 0-2  <3 (RBC/hpf)   Bacteria, UA MANY (*) RARE    Urine-Other MUCOUS PRESENT     Ct Abdomen Pelvis W Contrast  02/11/2011  *RADIOLOGY REPORT*  Clinical Data: Suprapubic pain and constipation.  No bowel movements for 19 days.  Leukocytosis.  CT ABDOMEN AND PELVIS WITH CONTRAST  Technique:  Multidetector CT imaging of the abdomen and pelvis was performed following the standard protocol during bolus administration of intravenous contrast.  Contrast: OMNIPAQUE IOHEXOL 300 MG/ML IV SOLN  Comparison: None.  Findings: The visualized lung  bases are clear.  The liver and spleen are unremarkable in appearance.  The gallbladder is within normal limits.  The pancreas and adrenal glands are unremarkable.  The kidneys are unremarkable in appearance.  There is no evidence of hydronephrosis.  No renal or ureteral stones are seen.  No perinephric stranding is appreciated.  No free fluid is identified.  The small bowel is unremarkable in appearance.  The stomach is within normal limits.  No acute vascular abnormalities are seen.  The appendix is decompressed and unremarkable in appearance, without evidence for appendicitis.  The colon is partially filled with stool, without evidence for significant constipation.  The colon is unremarkable in appearance.  The bladder is mildly distended and grossly unremarkable in appearance.  The uterus is within normal limits.  A 4.7 cm tubular structure at the right adnexa may reflect a right ovarian cyst or possibly a hydrosalpinx.  This has simple fluid attenuation, without definite evidence for pyosalpinx or tubo-ovarian abscess. No surrounding soft tissue inflammation is seen.  The left ovary is unremarkable in appearance.  No inguinal lymphadenopathy is seen.  No acute osseous abnormalities are identified.  IMPRESSION:  1.  No evidence to suggest significant constipation; the colon is partially filled with stool but remains normal in appearance. 2.  4.7 cm tubular structure at the right adnexa may reflect a right ovarian cyst or possibly a hydrosalpinx.  No evidence for pyosalpinx or tubo-ovarian abscess; no associated soft tissue inflammation seen.  If this corresponds to the patient's symptoms, pelvic ultrasound could be considered for further evaluation.  Original Report Authenticated By: Tonia Ghent, M.D.      No diagnosis found.  She was given IV fluids, IV Dilaudid, IV Zofran with good relief of pain. Workup is significant only for leukocytosis. Given the location of the pain in the lower abdomen and  moderate leukocytosis, I will treat her empirically for possible diverticulitis-even though this is not seen on CT scan.  MDM  Abdominal pain-etiology unclear.        Dione Booze, MD 02/11/11 [phone]

## 2011-02-17 DIAGNOSIS — R569 Unspecified convulsions: Secondary | ICD-10-CM

## 2011-02-17 HISTORY — DX: Unspecified convulsions: R56.9

## 2011-04-12 ENCOUNTER — Other Ambulatory Visit: Payer: Self-pay | Admitting: Family Medicine
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
## 2011-06-04 ENCOUNTER — Emergency Department (HOSPITAL_COMMUNITY)
Admission: EM | Admit: 2011-06-04 | Discharge: 2011-06-05 | Disposition: A | Discharge status: Home / Self Care | Payer: Medicare Other | Attending: Emergency Medicine | Admitting: Emergency Medicine

## 2011-06-04 ENCOUNTER — Encounter (HOSPITAL_COMMUNITY): Payer: Self-pay | Admitting: *Deleted

## 2011-06-04 DIAGNOSIS — G5602 Carpal tunnel syndrome, left upper limb: Secondary | ICD-10-CM

## 2011-06-04 DIAGNOSIS — G5600 Carpal tunnel syndrome, unspecified upper limb: Secondary | ICD-10-CM | POA: Insufficient documentation

## 2011-06-04 DIAGNOSIS — F17200 Nicotine dependence, unspecified, uncomplicated: Secondary | ICD-10-CM | POA: Insufficient documentation

## 2011-06-04 DIAGNOSIS — M25539 Pain in unspecified wrist: Secondary | ICD-10-CM | POA: Insufficient documentation

## 2011-06-04 DIAGNOSIS — M6740 Ganglion, unspecified site: Secondary | ICD-10-CM | POA: Insufficient documentation

## 2011-06-04 DIAGNOSIS — M67439 Ganglion, unspecified wrist: Secondary | ICD-10-CM

## 2011-06-04 NOTE — ED Notes (Signed)
Patient c/o left wrist pain that started yesterday.  Patient denies any injury to the wrist.  Patient also noticed that when she bends that wrist the pain is worse

## 2011-06-05 MED ORDER — HYDROCODONE-ACETAMINOPHEN 5-325 MG PO TABS: 1.0000 | ORAL_TABLET | ORAL | Status: AC | PRN

## 2011-06-05 NOTE — ED Notes (Signed)
Pt ambulated with a steady gait; VSS; A&Ox3; respirations even and unlabored; skin warm and dry; no questions.

## 2011-06-05 NOTE — ED Provider Notes (Signed)
History     CSN: [id number]  Arrival date & time 06/04/11  2313   First MD Initiated Contact with Patient 06/05/11 [phone]      Chief Complaint  Patient presents with  . Wrist Pain    (Consider location/radiation/quality/duration/timing/severity/associated sxs/prior treatment) HPI Comments: Patient with known history of carpal tunnel tendonitis presents with continued pain and tingling in left wrist to the palmar surface of the 2nd, third and 4th fingers - states she has an orthopedic surgeon who would like to do surgery on the patient but she has too many obligations at this time - became concerned with small swelling area to the dorsum of her left wrist.  No injury noted.  Patient is a 36 y.o. female presenting with wrist pain. The history is provided by the patient. No language interpreter was used.  Wrist Pain This is a recurrent problem. The current episode started more than 1 month ago. The problem occurs constantly. The problem has been unchanged. Associated symptoms include arthralgias, joint swelling and numbness. Pertinent negatives include no abdominal pain, anorexia, change in bowel habit, chest pain, chills, congestion, coughing, diaphoresis, fatigue, fever, headaches, myalgias, nausea, neck pain, rash, sore throat, swollen glands, urinary symptoms, vertigo, visual change, vomiting or weakness. The symptoms are aggravated by nothing. She has tried nothing for the symptoms. The treatment provided no relief.    Past Medical History  Diagnosis Date  . Seizures   . Asthma     History reviewed. No pertinent past surgical history.  History reviewed. No pertinent family history.  History  Substance Use Topics  . Smoking status: Current Everyday Smoker  . Smokeless tobacco: Not on file  . Alcohol Use: No    OB History    Grav Para Term Preterm Abortions TAB SAB Ect Mult Living                  Review of Systems  Constitutional: Negative for fever, chills, diaphoresis  and fatigue.  HENT: Negative for congestion, sore throat and neck pain.   Respiratory: Negative for cough.   Cardiovascular: Negative for chest pain.  Gastrointestinal: Negative for nausea, vomiting, abdominal pain, anorexia and change in bowel habit.  Musculoskeletal: Positive for joint swelling and arthralgias. Negative for myalgias.  Skin: Negative for rash.  Neurological: Positive for numbness. Negative for vertigo, weakness and headaches.  All other systems reviewed and are negative.    Allergies  Review of patient's allergies indicates no known allergies.  Home Medications   Current Outpatient Rx  Name Route Sig Dispense Refill  . ALBUTEROL SULFATE HFA 108 (90 BASE) MCG/ACT IN AERS Inhalation Inhale 2 puffs into the lungs every 6 (six) hours as needed. For ease of Breathing     . IBUPROFEN 200 MG PO TABS Oral Take 200 mg by mouth every 6 (six) hours as needed. For pain    . NAPROXEN SODIUM 220 MG PO TABS Oral Take 220 mg by mouth 2 (two) times daily as needed. For pain    . PHENYTOIN SODIUM EXTENDED 100 MG PO CAPS Oral Take 600 mg by mouth 2 (two) times daily.       BP 133/72  Pulse 80  Temp(Src) 98.3 F (36.8 C) (Oral)  Resp 20  SpO2 96%  Physical Exam  Nursing note and vitals reviewed. Constitutional: She is oriented to person, place, and time. She appears well-developed and well-nourished. No distress.  HENT:  Head: Normocephalic and atraumatic.  Right Ear: External ear normal.  Left Ear:  External ear normal.  Nose: Nose normal.  Mouth/Throat: Oropharynx is clear and moist. No oropharyngeal exudate.  Eyes: Conjunctivae are normal. Pupils are equal, round, and reactive to light. No scleral icterus.  Neck: Normal range of motion. Neck supple.  Cardiovascular: Normal rate, regular rhythm and normal heart sounds.  Exam reveals no gallop and no friction rub.   No murmur heard. Pulmonary/Chest: Effort normal and breath sounds normal. No respiratory distress. She has no  wheezes. She has no rales. She exhibits no tenderness.  Abdominal: Soft. Bowel sounds are normal. She exhibits no distension. There is no tenderness.  Musculoskeletal:       Left wrist: She exhibits tenderness. She exhibits normal range of motion, no bony tenderness, no swelling, no effusion, no crepitus and no laceration.       Arms:      Positive tinel's  Lymphadenopathy:    She has no cervical adenopathy.  Neurological: She is alert and oriented to person, place, and time. No cranial nerve deficit.  Skin: Skin is warm and dry. No rash noted. No erythema. No pallor.  Psychiatric: She has a normal mood and affect. Her behavior is normal. Judgment and thought content normal.    ED Course  Procedures (including critical care time)  Labs Reviewed - No data to display No results found.   Carpal tunnel syndrome Ganglion cyst    MDM  Patient placed in cock up splint until can be seen by her ortho - also with gangliion cyst to the left dorsum of the wrist.  Symptoms suffestive of worsening carpal tunnel.        Izola Price [place], [place] 06/05/11 [phone]

## 2011-06-05 NOTE — ED Provider Notes (Signed)
Medical screening examination/treatment/procedure(s) were performed by non-physician practitioner and as supervising physician I was immediately available for consultation/collaboration.  Doug Sou, MD 06/05/11 [phone]

## 2011-06-05 NOTE — Discharge Instructions (Signed)
Carpal Tunnel Syndrome Fact Sheet    You are working at your desk, trying to ignore the tingling or numbness you have had for months in your hand and wrist. Suddenly, a sharp, piercing pain shoots through the wrist and up your arm. Just a passing cramp? More likely you have carpal tunnel syndrome. This is a painful progressive condition caused by compression of a key nerve in the wrist.  WHAT IS CARPAL TUNNEL SYNDROME?  Carpal tunnel syndrome occurs when the nerve that runs from the forearm into the hand (median nerve), becomes pressed or squeezed at the wrist. The median nerve controls sensations to the palm side of the thumb and fingers (although not the little finger). That nerve also controls impulses to some small muscles in the hand, that allow the fingers and thumb to move. The narrow, rigid passageway of ligament and bones at the base of the hand (carpal tunnel) houses the median nerve and tendons. Sometimes, thickening from irritated tendons or other swelling narrows the tunnel. The narrowing causes the median nerve to be compressed. The result may be pain, weakness, or numbness in the hand and wrist that moves (radiates) up the arm. Although painful sensations may indicate other conditions, carpal tunnel syndrome is the most common and widely known of the entrapment neuropathies. These are conditions in which the body's peripheral nerves are compressed or traumatized.  SYMPTOMS   · Symptoms usually start gradually, with:   · Frequent burning.   · Tingling.   · Itching numbness in the palm of the hand and the fingers.   This is especially true in the thumb and the index and middle fingers.  · Some carpal tunnel sufferers say their fingers feel useless and swollen. They may feel this way even though little or no swelling is apparent.   · The symptoms often first appear in one or both hands during the night, since many people sleep with flexed wrists. A person with carpal tunnel syndrome may wake up feeling  the need to shake out the hand or wrist.   · As symptoms worsen, people might feel tingling during the day.   · Decreased grip strength may make it difficult to:   · Form a fist.   · Grasp small objects.   · Perform other manual tasks.   In chronic and/or untreated cases, the muscles at the base of the thumb may waste away.  · Some people are unable to tell between hot and cold by touch.   CAUSES   · Carpal tunnel syndrome is often the result of a combination of factors that increase pressure on the median nerve and tendons in the carpal tunnel. It is usually not a problem with the nerve itself.   · Most likely the disorder is due to a congenital predisposition. This means that the carpal tunnel is simply smaller in some people than in others.   · Other factors include:   · Trauma or injury to the wrist that cause swelling, such as sprain or fracture.   · Overactivity of the pituitary gland.   · Hypothyroidism.   · Rheumatoid arthritis.   · Mechanical problems in the wrist joint.   · Work stress.   · Repeated use of vibrating hand tools.   · Fluid retention during pregnancy or menopause.   · The development of a cyst or tumor in the canal.   · In some cases, no cause can be identified.   · There is little clinical   data to prove whether repetitive and forceful movements of the hand and wrist during work or leisure activities can cause carpal tunnel syndrome. Repeated motions performed in the course of normal work or other daily activities can result in repetitive motion disorders such as bursitis and tendonitis. Writer's cramp, a condition in which a lack of fine motor skill coordination and ache and pressure in the fingers, wrist, or forearm is brought on by repetitive activity, is not a symptom of carpal tunnel syndrome.   WHO IS AT RISK OF DEVELOPING CARPAL TUNNEL SYNDROME?  · Women are three times more likely than men to develop carpal tunnel syndrome. This may be the result of the carpal tunnel being smaller in  women.   · The dominant hand is usually affected first and produces the most severe pain.   · Diabetes or other metabolic disorders that directly affect the body's nerves make people more susceptible to compression.   · Carpal tunnel syndrome usually occurs only in adults.   · The risk of developing carpal tunnel syndrome is not confined to people in a single industry or job. However, it is especially common in those performing assembly line work, such as:   · Manufacturing.   · Sewing.   · Finishing.   · Cleaning.   · Meat packing.   Carpal tunnel syndrome is three times more common among assemblers than among data-entry personnel. A 2001 study by the [facility] found heavy computer use (up to 7 hours a day) did not increase a person's risk of developing carpal tunnel syndrome.  · During 1998, an estimated three of every 10,000 workers lost time from work because of carpal tunnel syndrome. Half of these workers missed more than 10 days of work. The average lifetime cost of carpal tunnel syndrome is estimated to be about $30,000 for each injured worker. This includes medical bills and lost time from work   DIAGNOSIS   · Early diagnosis and treatment are important to avoid permanent damage to the median nerve.   · A physical examination of the hands, arms, shoulders, and neck can help determine if the patient's complaints are related to daily activities or to an underlying disorder. An exam can rule out other painful conditions that mimic carpal tunnel syndrome.   · The wrist is examined for:   · Tenderness.   · Swelling.   · Warmth.   · Discoloration.   · Each finger should be tested for sensation. The muscles at the base of the hand should be examined for strength and signs of shrinking (atrophy).   · Routine laboratory tests and X-rays can reveal:   · Diabetes.   · Arthritis.   · Fractures.   · Physicians can use specific tests to try to produce the symptoms of carpal tunnel syndrome.   · In the Tinel test, the  caregiver taps or presses on the median nerve in the patient's wrist. The test is positive when tingling in the fingers or a shock-like sensation occurs.   · The Phalen or wrist-flexion test involves having the patient hold their forearms upright by pointing the fingers down and pressing the backs of the hands together. The presence of carpal tunnel syndrome is suggested if one or more symptoms is felt in the fingers within 1 minute.   · Caregivers may also ask patients to try to make a movement that brings on symptoms.   · Often it is necessary to confirm the diagnosis by use of   electrodiagnostic tests.   · In a nerve conduction study, electrodes are placed on the hand and wrist. Small electric shocks are applied and the speed with which nerves transmit impulses is measured.   · In electromyography, a fine needle is inserted into a muscle. The electrical activity is viewed on a screen. The activity can determine the severity of damage to the median nerve.   · Ultrasound imaging can show impaired movement of the median nerve.   TREATMENT   Treatments for carpal tunnel syndrome should begin as early as possible. It should be done under a caregiver's direction. Underlying causes such as diabetes or arthritis should be treated first. Initial treatment generally involves:  · Resting the affected hand and wrist for at least 2 weeks.   · Avoiding activities that may worsen symptoms.   · Immobilizing (keeping from moving) the wrist in a splint.   These things are all done to avoid further damage from twisting or bending. If there is inflammation, applying cool packs can help reduce swelling.  NON-SURGICAL  · Drugs - In special circumstances, different drugs can ease the pain and swelling.   · Nonsteroidal anti-inflammatory drugs, such as aspirin, and other non-prescription pain relievers, may ease symptoms.   · Orally administered diuretics (water pills) can decrease swelling.   · Corticosteroids such as prednisone or  lidocaine can be injected directly into the wrist or taken by mouth. This can relieve pressure on the median nerve. It may provide immediate, but temporary relief to persons with mild or intermittent symptoms. (Caution: persons with diabetes and those who may be predisposed to diabetes should note that long term use of corticosteroids can make it difficult to regulate insulin levels. Corticosterioids should not be taken without a prescription.)   · Additionally, some studies show that vitamin B6 (pyridoxine) supplements may ease the symptoms of carpal tunnel syndrome.   · Exercise - Stretching and strengthening exercises can be helpful in people whose symptoms have decreased. These exercises may be supervised by a physical therapist that is trained to use exercises to treat physical impairments. The exercises can also be supervised by an occupational therapist. This is someone who is trained in evaluating people with physical impairments. They will help them build skills to improve their health and well-being.   · Alternative therapies - Acupuncture and chiropractic care have helped some patients. Their effectiveness remains unproved. An exception is yoga, which has been shown to reduce pain and improve grip strength among patients with carpal tunnel syndrome.   SURGERY  Carpal tunnel release is a surgical procedure commonly used. It is generally recommended if symptoms last for 6 months or more. This surgery involves cutting the band of tissue around the wrist to reduce pressure on the median nerve. Surgery is done under local anesthesia. It does not require an overnight hospital stay. Many patients require surgery on both hands. The following are types of carpal tunnel release surgery:  · Open release surgery. This is the traditional procedure used to correct carpal tunnel syndrome. It consists of making a cut (incision) up to 2 inches in the wrist. Then, the surgeon cuts the carpal ligament to enlarge the carpal  tunnel. The procedure is generally done under local anesthesia on an outpatient basis. The only exception is if there are unusual medical considerations.   · Endoscopic surgery may allow faster recovery and less postoperative discomfort than traditional open release surgery. The surgeon makes two incisions (about 1/2" each) in the wrist and   damage.   Stiffness.   Pain at the scar.  Occasionally, the wrist loses strength because the carpal ligament is cut. Patients should undergo physical therapy after surgery to restore wrist strength. Some patients may need to adjust job duties or even change jobs after recovery from surgery. Recurrence of carpal tunnel syndrome following treatment is rare. The majority of patients recover completely. PREVENTION  At the workplace, workers can:   Do on-the-job conditioning, perform stretching exercises.   Take frequent rest breaks.   Wear splints to keep wrists straight.   Use correct posture and wrist position.   Wearing finger-less gloves can help keep hands warm and flexible.   Design workstations, tools and tool handles, and tasks to enable the worker's wrist to maintain a natural position.   Rotate jobs among workers.   Employers can develop programs in ergonomics. This is the process of adapting workplace conditions and job demands to the capabilities of workers. However, research has not conclusively shown that  these workplace changes prevent the occurrence of carpal tunnel syndrome.  WHAT RESEARCH IS BEING DONE? The [employer] of Neurological Disorders and Stroke (NINDS), a part of the [employer], is the federal government's leading supporter of research on carpal tunnel syndrome. Scientists are studying the order of events that occur with carpal tunnel syndrome. They do this to better understand, treat, and prevent this ailment.  WHAT IS PERCUTANEOUS BALLOON CARPAL TUNNEL-PLASTY? Percutaneous balloon carpal tunnel-plasty is an experimental technique that can ease carpal tunnel pain without cutting the carpal ligament. In this procedure, a 1/4 -inch cut is made at the base of the palm. The caregiver then inserts a balloon through a catheter under the carpal ligament and inflates the balloon to stretch the ligament and free the nerve. Patients in one small study of this procedure reported relief of symptoms with no complications after the surgery. Most of them were back to work within 2 weeks. This experimental technique is not yet widely available. FOR MORE INFORMATION American Academy of Orthopaedic Surgeons: www.aaos.org  Centers for Disease Control and Prevention (CDCP): FootballExhibition.com.br [employer] of Arthritis and Musculoskeletal and Skin Diseases (NIAMS): www.niams.http://www.myers.net/ American Chronic Pain Association (ACPA): www.theacpa.org National Chronic Pain Outreach Association (NCPOA): www.chronicpain.org Occupational Safety & Health Administration: ArmyDictionary.fi Document Released: 09/17/2003 Document Revised: 10/15/2010 Document Reviewed: 09/21/2007 [facility] Patient Information 2012 [place], [place].Ganglion A ganglion is a swelling under the skin that is filled with a thick, jelly-like substance. It is a synovial cyst. This is caused by a break (rupture) of the joint lining from the joint space. A ganglion often occurs near an area of repeated minor trauma (damage caused by an  accident). Trauma may also be a repetitive movement at work or in a sport. TREATMENT  It often goes away without treatment. It may reappear later. Sometimes a ganglion may need to be surgically removed. Often they are drained and injected with a steroid. Sometimes they respond to:  Rest.   Splinting.  HOME CARE INSTRUCTIONS   Your caregiver will decide the best way of treating your ganglion. Do not try to break the ganglion yourself by pressing on it, poking it with a needle, or hitting it with a heavy object.   Use medications as directed.  SEEK MEDICAL CARE IF:   The ganglion becomes larger or more painful.   You have increased redness or swelling.   You have weakness or numbness in your hand or wrist.  MAKE SURE YOU:   Understand these instructions.  Will watch your condition.   Will get help right away if you are not doing well or get worse.  Document Released: 01/31/2000 Document Revised: 01/22/2011 Document Reviewed: 03/29/2007 [facility] Patient Information 2012 [place], [place].

## 2012-02-17 DIAGNOSIS — K8020 Calculus of gallbladder without cholecystitis without obstruction: Secondary | ICD-10-CM

## 2012-02-17 HISTORY — DX: Calculus of gallbladder without cholecystitis without obstruction: K80.20

## 2012-09-16 ENCOUNTER — Inpatient Hospital Stay (HOSPITAL_COMMUNITY)
Admission: AD | Admit: 2012-09-16 | Discharge: 2012-09-16 | Disposition: A | Discharge status: Home / Self Care | Payer: Medicare Other | Source: Ambulatory Visit | Attending: Obstetrics & Gynecology | Admitting: Obstetrics & Gynecology

## 2012-09-16 ENCOUNTER — Encounter (HOSPITAL_COMMUNITY): Payer: Self-pay | Admitting: *Deleted

## 2012-09-16 DIAGNOSIS — N9089 Other specified noninflammatory disorders of vulva and perineum: Secondary | ICD-10-CM | POA: Insufficient documentation

## 2012-09-16 DIAGNOSIS — N907 Vulvar cyst: Secondary | ICD-10-CM

## 2012-09-16 HISTORY — DX: Calculus of gallbladder without cholecystitis without obstruction: K80.20

## 2012-09-16 MED ORDER — HYDROCODONE-ACETAMINOPHEN 5-325 MG PO TABS: 1.0000 | ORAL_TABLET | Freq: Four times a day (QID) | ORAL | Status: DC | PRN

## 2012-09-16 NOTE — MAU Note (Signed)
Pt reports a cyst in vaginal area x 2 days.

## 2012-09-16 NOTE — MAU Provider Note (Signed)
  History     CSN: [id number]  Arrival date and time: 09/16/12 0149   None     Chief Complaint  Patient presents with  . Groin Swelling   HPI This is a 37 y.o. female who presents with a 2 day history of a painful lump on right labial skin. Has never had this happen before. Has tried warm soaks, but no improvement. No fever or other symptoms  RN Note Pt reports a cyst in vaginal area x 2 days.       OB History   Grav Para Term Preterm Abortions TAB SAB Ect Mult Living   3 3        3       Past Medical History  Diagnosis Date  . Seizures   . Asthma   . Kidney stones 2014  . Gall stones 2014    Past Surgical History  Procedure Laterality Date  . Tonsillectomy      History reviewed. No pertinent family history.  History  Substance Use Topics  . Smoking status: Current Every Day Smoker  . Smokeless tobacco: Not on file  . Alcohol Use: No    Allergies: No Known Allergies  Prescriptions prior to admission  Medication Sig Dispense Refill  . albuterol (PROVENTIL HFA;VENTOLIN HFA) 108 (90 BASE) MCG/ACT inhaler Inhale 2 puffs into the lungs every 6 (six) hours as needed. For ease of Breathing       . hydrOXYzine (VISTARIL) 25 MG capsule Take 25 mg by mouth 4 (four) times daily as needed for itching.      Marland Kitchen ibuprofen (ADVIL,MOTRIN) 200 MG tablet Take 200 mg by mouth every 6 (six) hours as needed. For pain      . phenytoin (DILANTIN) 100 MG ER capsule Take 600 mg by mouth 2 (two) times daily.       . naproxen sodium (ANAPROX) 220 MG tablet Take 220 mg by mouth 2 (two) times daily as needed. For pain        Review of Systems  Constitutional: Negative for fever, chills and malaise/fatigue.  Gastrointestinal: Negative for nausea, vomiting and abdominal pain.       Painful lump on right labia minora    Physical Exam   Blood pressure 127/79, pulse 75, temperature 98 F (36.7 C), temperature source Oral, resp. rate 20, height 5\' 2"  (1.575 m), weight 104.327 kg (230  lb), last menstrual period 09/01/2012, SpO2 98.00%.  Physical Exam  Constitutional: She is oriented to person, place, and time. She appears well-developed and well-nourished. No distress.  HENT:  Head: Normocephalic.  Cardiovascular: Normal rate.   Respiratory: Effort normal.  GI: Soft.  Genitourinary: Vagina normal and uterus normal. No vaginal discharge found.  Right flap of labia minora has a pea sized 2mm round mass which is firm and not fluctuant. Painful to palpation. Unable to express any fluid  Musculoskeletal: Normal range of motion.  Neurological: She is alert and oriented to person, place, and time.  Skin: Skin is warm and dry.  Psychiatric: She has a normal mood and affect.    MAU Course  Procedures  Assessment and Plan  A:  Right labial cyst        P:  Discussed findings      Recommend warm soaks       Come back if gets larger or more fluctuant       Rx vicodin #20 for pain   [facility] 09/16/2012, 2:40 AM

## 2012-09-18 NOTE — MAU Provider Note (Signed)

## 2012-10-27 ENCOUNTER — Ambulatory Visit: Payer: Self-pay | Admitting: Obstetrics & Gynecology

## 2012-11-16 DIAGNOSIS — J189 Pneumonia, unspecified organism: Secondary | ICD-10-CM

## 2012-11-16 HISTORY — DX: Pneumonia, unspecified organism: J18.9

## 2013-02-16 DIAGNOSIS — H7092 Unspecified mastoiditis, left ear: Secondary | ICD-10-CM

## 2013-02-16 HISTORY — DX: Unspecified mastoiditis, left ear: H70.92

## 2013-06-05 ENCOUNTER — Encounter (HOSPITAL_COMMUNITY): Payer: Self-pay | Admitting: Emergency Medicine

## 2013-06-05 DIAGNOSIS — Z87442 Personal history of urinary calculi: Secondary | ICD-10-CM | POA: Insufficient documentation

## 2013-06-05 DIAGNOSIS — J45909 Unspecified asthma, uncomplicated: Secondary | ICD-10-CM | POA: Insufficient documentation

## 2013-06-05 DIAGNOSIS — Z79899 Other long term (current) drug therapy: Secondary | ICD-10-CM | POA: Insufficient documentation

## 2013-06-05 DIAGNOSIS — K8020 Calculus of gallbladder without cholecystitis without obstruction: Secondary | ICD-10-CM | POA: Insufficient documentation

## 2013-06-05 DIAGNOSIS — G40909 Epilepsy, unspecified, not intractable, without status epilepticus: Secondary | ICD-10-CM | POA: Insufficient documentation

## 2013-06-05 DIAGNOSIS — F17200 Nicotine dependence, unspecified, uncomplicated: Secondary | ICD-10-CM | POA: Insufficient documentation

## 2013-06-05 DIAGNOSIS — D72829 Elevated white blood cell count, unspecified: Secondary | ICD-10-CM | POA: Insufficient documentation

## 2013-06-05 DIAGNOSIS — Z791 Long term (current) use of non-steroidal anti-inflammatories (NSAID): Secondary | ICD-10-CM | POA: Insufficient documentation

## 2013-06-05 LAB — CBC WITH DIFFERENTIAL/PLATELET
Basophils Absolute: 0 10*3/uL (ref 0.0–0.1)
Basophils Relative: 0 % (ref 0–1)
EOS PCT: 2 % (ref 0–5)
Eosinophils Absolute: 0.4 10*3/uL (ref 0.0–0.7)
HEMATOCRIT: 41.7 % (ref 36.0–46.0)
Hemoglobin: 13.8 g/dL (ref 12.0–15.0)
LYMPHS ABS: 4.4 10*3/uL — AB (ref 0.7–4.0)
Lymphocytes Relative: 26 % (ref 12–46)
MCH: 28 pg (ref 26.0–34.0)
MCHC: 33.1 g/dL (ref 30.0–36.0)
MCV: 84.8 fL (ref 78.0–100.0)
MONO ABS: 0.6 10*3/uL (ref 0.1–1.0)
Monocytes Relative: 4 % (ref 3–12)
NEUTROS ABS: 11.5 10*3/uL — AB (ref 1.7–7.7)
Neutrophils Relative %: 68 % (ref 43–77)
Platelets: 336 10*3/uL (ref 150–400)
RBC: 4.92 MIL/uL (ref 3.87–5.11)
RDW: 15.1 % (ref 11.5–15.5)
WBC: 16.9 10*3/uL — AB (ref 4.0–10.5)

## 2013-06-05 LAB — COMPREHENSIVE METABOLIC PANEL
ALK PHOS: 85 U/L (ref 39–117)
ALT: 17 U/L (ref 0–35)
AST: 25 U/L (ref 0–37)
Albumin: 3.4 g/dL — ABNORMAL LOW (ref 3.5–5.2)
BUN: 11 mg/dL (ref 6–23)
CO2: 23 meq/L (ref 19–32)
CREATININE: 0.92 mg/dL (ref 0.50–1.10)
Calcium: 9.4 mg/dL (ref 8.4–10.5)
Chloride: 102 mEq/L (ref 96–112)
GFR calc Af Amer: >90 mL/min (ref >90)
GFR, EST NON AFRICAN AMERICAN: 79 mL/min — AB (ref >90)
Glucose, Bld: 100 mg/dL — ABNORMAL HIGH (ref 70–99)
POTASSIUM: 4.5 meq/L (ref 3.7–5.3)
Sodium: 140 mEq/L (ref 137–147)
Total Protein: 7.6 g/dL (ref 6.0–8.3)

## 2013-06-05 LAB — LIPASE, BLOOD: Lipase: 68 U/L — ABNORMAL HIGH (ref 11–59)

## 2013-06-05 MED ORDER — ONDANSETRON 4 MG PO TBDP
8.0000 mg | ORAL_TABLET | Freq: Once | ORAL | Status: AC
Start: 2013-06-05 — End: 2013-06-05
  Administered 2013-06-05: 8 mg via ORAL
  Filled 2013-06-05: qty 2

## 2013-06-05 NOTE — ED Notes (Signed)
Pt states she has been having abd pain and nausea for 8 months following finding out she had gallbladder issues.  Pt states she has been having increased vomiting and nausea for the past week.

## 2013-06-06 ENCOUNTER — Emergency Department (HOSPITAL_COMMUNITY): Payer: Medicare Other

## 2013-06-06 ENCOUNTER — Emergency Department (HOSPITAL_COMMUNITY)
Admission: EM | Admit: 2013-06-06 | Discharge: 2013-06-06 | Disposition: A | Discharge status: Home / Self Care | Payer: Medicare Other | Attending: Emergency Medicine | Admitting: Emergency Medicine

## 2013-06-06 DIAGNOSIS — K8050 Calculus of bile duct without cholangitis or cholecystitis without obstruction: Secondary | ICD-10-CM

## 2013-06-06 MED ORDER — SODIUM CHLORIDE 0.9 % IV BOLUS (SEPSIS)
1000.0000 mL | Freq: Once | INTRAVENOUS | Status: AC
  Administered 2013-06-06: 1000 mL via INTRAVENOUS

## 2013-06-06 MED ORDER — MORPHINE SULFATE 4 MG/ML IJ SOLN
4.0000 mg | INTRAMUSCULAR | Status: DC | PRN
  Administered 2013-06-06: 4 mg via INTRAVENOUS
  Filled 2013-06-06: qty 1

## 2013-06-06 MED ORDER — DOCUSATE SODIUM 100 MG PO CAPS: 100.0000 mg | ORAL_CAPSULE | Freq: Two times a day (BID) | ORAL | Status: DC

## 2013-06-06 MED ORDER — ONDANSETRON HCL 4 MG/2ML IJ SOLN
4.0000 mg | Freq: Once | INTRAMUSCULAR | Status: AC
  Administered 2013-06-06: 4 mg via INTRAVENOUS
  Filled 2013-06-06: qty 2

## 2013-06-06 MED ORDER — ONDANSETRON 4 MG PO TBDP: 4.0000 mg | ORAL_TABLET | Freq: Three times a day (TID) | ORAL | Status: DC | PRN

## 2013-06-06 MED ORDER — HYDROCODONE-ACETAMINOPHEN 5-325 MG PO TABS: 2.0000 | ORAL_TABLET | ORAL | Status: DC | PRN

## 2013-06-06 NOTE — ED Notes (Signed)
Discharge instructions reviewed with pt. Pt verbalized understanding.   

## 2013-06-06 NOTE — ED Provider Notes (Addendum)
CSN: [id number]     Arrival date & time 06/05/13  2221 History   First MD Initiated Contact with Patient 06/06/13 0044     Chief Complaint  Patient presents with  . Nausea  . Emesis      HPI  Patient presents with epigastric abdominal pain. For the last several months, has known she has gallstones. Diagnosis made at [facility]. Has had intermittent symptoms over that time period. For the last 24 hours had nausea and some epigastric and right quadrant pain. Presents here.  Called last week and has an appointment May 5th at [facility] [place] [facility] to discuss cholecystectomy. No fever. No dark urine or light stools.  Past Medical History  Diagnosis Date  . Seizures   . Asthma   . Kidney stones 2014  . Gall stones 2014   Past Surgical History  Procedure Laterality Date  . Tonsillectomy     No family history on file. History  Substance Use Topics  . Smoking status: Current Every Day Smoker -- 0.50 packs/day    Types: Cigarettes  . Smokeless tobacco: Not on file  . Alcohol Use: No   OB History   Grav Para Term Preterm Abortions TAB SAB Ect Mult Living   3 3        3      Review of Systems  Constitutional: Negative for fever, chills, diaphoresis, appetite change and fatigue.  HENT: Negative for mouth sores, sore throat and trouble swallowing.   Eyes: Negative for visual disturbance.  Respiratory: Negative for cough, chest tightness, shortness of breath and wheezing.   Cardiovascular: Negative for chest pain.  Gastrointestinal: Positive for nausea and abdominal pain. Negative for vomiting, diarrhea and abdominal distention.  Endocrine: Negative for polydipsia, polyphagia and polyuria.  Genitourinary: Negative for dysuria, frequency and hematuria.  Musculoskeletal: Negative for gait problem.  Skin: Negative for color change, pallor and rash.  Neurological: Negative for dizziness, syncope, light-headedness and headaches.  Hematological: Does not bruise/bleed easily.    Psychiatric/Behavioral: Negative for behavioral problems and confusion.      Allergies  Review of patient's allergies indicates no known allergies.  Home Medications   Prior to Admission medications   Medication Sig Start Date End Date Taking? Authorizing Provider  albuterol (PROVENTIL HFA;VENTOLIN HFA) 108 (90 BASE) MCG/ACT inhaler Inhale 4 puffs into the lungs every 6 (six) hours as needed. For ease of Breathing   Yes Historical Provider, MD  cetirizine (ZYRTEC) 10 MG tablet Take 10 mg by mouth daily.   Yes Historical Provider, MD  naproxen sodium (ANAPROX) 220 MG tablet Take 220 mg by mouth 4 (four) times daily.   Yes Historical Provider, MD  docusate sodium (COLACE) 100 MG capsule Take 1 capsule (100 mg total) by mouth every 12 (twelve) hours. 06/06/13   Tanna Furry, MD  HYDROcodone-acetaminophen (NORCO/VICODIN) 5-325 MG per tablet Take 2 tablets by mouth every 4 (four) hours as needed. 06/06/13   Tanna Furry, MD  hydrOXYzine (VISTARIL) 25 MG capsule Take 25 mg by mouth at bedtime as needed for itching.     Historical Provider, MD  ondansetron (ZOFRAN ODT) 4 MG disintegrating tablet Take 1 tablet (4 mg total) by mouth every 8 (eight) hours as needed for nausea. 06/06/13   Tanna Furry, MD  phenytoin (DILANTIN) 100 MG ER capsule Take 100 mg by mouth 3 (three) times daily.     Historical Provider, MD   BP 132/67  Pulse 86  Temp(Src) 98 F (36.7 C) (Oral)  Resp 16  Ht 5' (1.524 m)  Wt 237 lb 6.4 oz (107.684 kg)  BMI 46.36 kg/m2  SpO2 99% Physical Exam  Constitutional: She is oriented to person, place, and time. She appears well-developed and well-nourished. No distress.  HENT:  Head: Normocephalic.  Eyes: Conjunctivae are normal. Pupils are equal, round, and reactive to light. No scleral icterus.  Neck: Normal range of motion. Neck supple. No thyromegaly present.  Cardiovascular: Normal rate and regular rhythm.  Exam reveals no gallop and no friction rub.   No murmur  heard. Pulmonary/Chest: Effort normal and breath sounds normal. No respiratory distress. She has no wheezes. She has no rales.  Abdominal: Soft. Bowel sounds are normal. She exhibits no distension. There is tenderness. There is no rebound.    Musculoskeletal: Normal range of motion.  Neurological: She is alert and oriented to person, place, and time.  Skin: Skin is warm and dry. No rash noted.  Psychiatric: She has a normal mood and affect. Her behavior is normal.    ED Course  Procedures (including critical care time) Labs Review Labs Reviewed  CBC WITH DIFFERENTIAL - Abnormal; Notable for the following:    WBC 16.9 (*)    Neutro Abs 11.5 (*)    Lymphs Abs 4.4 (*)    All other components within normal limits  LIPASE, BLOOD - Abnormal; Notable for the following:    Lipase 68 (*)    All other components within normal limits  COMPREHENSIVE METABOLIC PANEL - Abnormal; Notable for the following:    Glucose, Bld 100 (*)    Albumin 3.4 (*)    Total Bilirubin <0.2 (*)    GFR calc non Af Amer 79 (*)    All other components within normal limits    Imaging Review US Abdomen Limited  06/06/2013   CLINICAL DATA:  Right upper quadrant pain  EXAM: US ABDOMEN LIMITED - RIGHT UPPER QUADRANT  COMPARISON:  CT ABD/PELVIS W CM dated 02/11/2011  FINDINGS: Gallbladder:  Multiple gallstones are present. The largest is 7.5 mm in diameter. The gallbladder is decompressed. Gallbladder wall is thickened measuring up to 4 mm. Positive sonographic Murphy sign.  Common bile duct:  Diameter: 5 mm in caliber.  Liver:  Diffusely increased in echogenicity without focal mass.  IMPRESSION: Cholelithiasis.  There are other findings suggesting acute cholecystitis. Correlate clinically as for the need for nuclear medicine imaging.  Increased echogenicity in the liver likely due to diffuse hepatic steatosis.   Electronically Signed   By: Maryclare Bean M.D.   On: 06/06/2013 01:48     EKG Interpretation None      MDM    Final diagnoses:  Biliary colic    Leukocytosis at 16.9. No pericholecystic fluid. GB wall thickening. Symptoms improved after pain meds. Discussed case with Dr. Grandville Silos on for CC surgery. Patient is more comfortable. He felt that followup to her pre-scheduled clinic appointment with [place] [facility] is all this indicated tonight. She'll be discharged home with pain control measures with Vicodin, Zofran, bland low-fat diet. Recheck in the interval if any worsening symptoms.    Tanna Furry, MD 06/06/13 0246  Tanna Furry, MD 06/10/13 0700

## 2013-06-06 NOTE — Discharge Instructions (Signed)
Biliary Colic  °Biliary colic is a steady or irregular pain in the upper abdomen. It is usually under the right side of the rib cage. It happens when gallstones interfere with the normal flow of bile from the gallbladder. Bile is a liquid that helps to digest fats. Bile is made in the liver and stored in the gallbladder. When you eat a meal, bile passes from the gallbladder through the cystic duct and the common bile duct into the small intestine. There, it mixes with partially digested food. If a gallstone blocks either of these ducts, the normal flow of bile is blocked. The muscle cells in the bile duct contract forcefully to try to move the stone. This causes the pain of biliary colic.  °SYMPTOMS  °· A person with biliary colic usually complains of pain in the upper abdomen. This pain can be: °· In the center of the upper abdomen just below the breastbone. °· In the upper-right part of the abdomen, near the gallbladder and liver. °· Spread back toward the right shoulder blade. °· Nausea and vomiting. °· The pain usually occurs after eating. °· Biliary colic is usually triggered by the digestive system's demand for bile. The demand for bile is high after fatty meals. Symptoms can also occur when a person who has been fasting suddenly eats a very large meal. Most episodes of biliary colic pass after 1 to 5 hours. After the most intense pain passes, your abdomen may continue to ache mildly for about 24 hours. °DIAGNOSIS  °After you describe your symptoms, your caregiver will perform a physical exam. He or she will pay attention to the upper right portion of your belly (abdomen). This is the area of your liver and gallbladder. An ultrasound will help your caregiver look for gallstones. Specialized scans of the gallbladder may also be done. Blood tests may be done, especially if you have fever or if your pain persists. °PREVENTION  °Biliary colic can be prevented by controlling the risk factors for gallstones. Some of  these risk factors, such as heredity, increasing age, and pregnancy are a normal part of life. Obesity and a high-fat diet are risk factors you can change through a healthy lifestyle. Women going through menopause who take hormone replacement therapy (estrogen) are also more likely to develop biliary colic. °TREATMENT  °· Pain medication may be prescribed. °· You may be encouraged to eat a fat-free diet. °· If the first episode of biliary colic is severe, or episodes of colic keep retuning, surgery to remove the gallbladder (cholecystectomy) is usually recommended. This procedure can be done through small incisions using an instrument called a laparoscope. The procedure often requires a brief stay in the hospital. Some people can leave the hospital the same day. It is the most widely used treatment in people troubled by painful gallstones. It is effective and safe, with no complications in more than 90% of cases. °· If surgery cannot be done, medication that dissolves gallstones may be used. This medication is expensive and can take months or years to work. Only small stones will dissolve. °· Rarely, medication to dissolve gallstones is combined with a procedure called shock-wave lithotripsy. This procedure uses carefully aimed shock waves to break up gallstones. In many people treated with this procedure, gallstones form again within a few years. °PROGNOSIS  °If gallstones block your cystic duct or common bile duct, you are at risk for repeated episodes of biliary colic. There is also a 25% chance that you will develop   a gallbladder infection(acute cholecystitis), or some other complication of gallstones within 10 to 20 years. If you have surgery, schedule it at a time that is convenient for you and at a time when you are not sick. °HOME CARE INSTRUCTIONS  °· Drink plenty of clear fluids. °· Avoid fatty, greasy or fried foods, or any foods that make your pain worse. °· Take medications as directed. °SEEK MEDICAL  CARE IF:  °· You develop a fever over 100.5° F (38.1° C). °· Your pain gets worse over time. °· You develop nausea that prevents you from eating and drinking. °· You develop vomiting. °SEEK IMMEDIATE MEDICAL CARE IF:  °· You have continuous or severe belly (abdominal) pain which is not relieved with medications. °· You develop nausea and vomiting which is not relieved with medications. °· You have symptoms of biliary colic and you suddenly develop a fever and shaking chills. This may signal cholecystitis. Call your caregiver immediately. °· You develop a yellow color to your skin or the white part of your eyes (jaundice). °Document Released: 07/06/2005 Document Revised: 04/27/2011 Document Reviewed: 09/15/2007 °ExitCare® Patient Information ©2014 [employer]. ° °Cholelithiasis °Cholelithiasis (also called gallstones) is a form of gallbladder disease in which gallstones form in your gallbladder. The gallbladder is an organ that stores bile made in the liver, which helps digest fats. Gallstones begin as small crystals and slowly grow into stones. Gallstone pain occurs when the gallbladder spasms and a gallstone is blocking the duct. Pain can also occur when a stone passes out of the duct.  °RISK FACTORS °· Being female.   °· Having multiple pregnancies. Health care providers sometimes advise removing diseased gallbladders before future pregnancies.   °· Being obese. °· Eating a diet heavy in fried foods and fat.   °· Being older than 60 years and increasing age.   °· Prolonged use of medicines containing female hormones.   °· Having diabetes mellitus.   °· Rapidly losing weight.   °· Having a family history of gallstones (heredity).   °SYMPTOMS °· Nausea.   °· Vomiting. °· Abdominal pain.   °· Yellowing of the skin (jaundice).   °· Sudden pain. It may persist from several minutes to several hours. °· Fever.   °· Tenderness to the touch.  °In some cases, when gallstones do not move into the bile duct, people have no  pain or symptoms. These are called "silent" gallstones.  °TREATMENT °Silent gallstones do not need treatment. In severe cases, emergency surgery may be required. Options for treatment include: °· Surgery to remove the gallbladder. This is the most common treatment. °· Medicines. These do not always work and may take 6 12 months or more to work. °· Shock wave treatment (extracorporeal biliary lithotripsy). In this treatment an ultrasound machine sends shock waves to the gallbladder to break gallstones into smaller pieces that can pass into the intestines or be dissolved by medicine. °HOME CARE INSTRUCTIONS  °· Only take over-the-counter or prescription medicines for pain, discomfort, or fever as directed by your health care provider.   °· Follow a low-fat diet until seen again by your health care provider. Fat causes the gallbladder to contract, which can result in pain.   °· Follow up with your health care provider as directed. Attacks are almost always recurrent and surgery is usually required for permanent treatment.   °SEEK IMMEDIATE MEDICAL CARE IF:  °· Your pain increases and is not controlled by medicines.   °· You have a fever or persistent symptoms for more than 2 3 days.   °· You have a fever and your symptoms   suddenly get worse.   °· You have persistent nausea and vomiting.   °MAKE SURE YOU:  °· Understand these instructions. °· Will watch your condition. °· Will get help right away if you are not doing well or get worse. °Document Released: 01/29/2005 Document Revised: 10/05/2012 Document Reviewed: 07/27/2012 °ExitCare® Patient Information ©2014 [employer]. ° °

## 2013-06-07 ENCOUNTER — Encounter (INDEPENDENT_AMBULATORY_CARE_PROVIDER_SITE_OTHER): Payer: Self-pay | Admitting: General Surgery

## 2013-06-07 ENCOUNTER — Other Ambulatory Visit (INDEPENDENT_AMBULATORY_CARE_PROVIDER_SITE_OTHER): Payer: Self-pay | Admitting: *Deleted

## 2013-06-07 ENCOUNTER — Ambulatory Visit (INDEPENDENT_AMBULATORY_CARE_PROVIDER_SITE_OTHER): Payer: Medicare Other | Admitting: General Surgery

## 2013-06-07 VITALS — BP 126/78 | HR 73 | Temp 97.8°F | Ht 63.0 in | Wt 236.2 lb

## 2013-06-07 DIAGNOSIS — K8010 Calculus of gallbladder with chronic cholecystitis without obstruction: Secondary | ICD-10-CM | POA: Insufficient documentation

## 2013-06-07 MED ORDER — TRAMADOL HCL 50 MG PO TABS: 50.0000 mg | ORAL_TABLET | Freq: Four times a day (QID) | ORAL | Status: DC | PRN

## 2013-06-07 MED ORDER — CIPROFLOXACIN HCL 500 MG PO TABS: 500.0000 mg | ORAL_TABLET | Freq: Two times a day (BID) | ORAL | Status: AC

## 2013-06-07 NOTE — Progress Notes (Addendum)
Patient ID: Lisa Simpson, female   DOB: 10/28/1975, 37 y.o.   MRN: [id number]  Chief Complaint  Patient presents with  . Abdominal Pain    new pt    Note: This dictation was prepared with Dragon/digital dictation along with [employer]. Any transcriptional errors that result from this process are unintentional.  HPI Lisa Simpson is a 38 y.o. female.  She is referred by Dr. Lavell Anchors, EDP, for evaluation and management of cholecystitis with cholelithiasis. Dr. Osie Cheeks at [facility]  family practice is her PCP.   She has had attacks of upper abdominal pain, RUQ pain, right flank pain, nausea and vomiting for a year. This has become more frequent now. She says this will hurt her 3 times a week. She continues to vomit. She had her worst attack yesterday and went to the emergency department. She states that she actually knew she had gallstones from an ultrasound at [facility] previously.An ultrasound report dated 11/03/2012 shows multiple gallstones, normal CBD at 0.34 CM, fatty liver, bilateral nonobstructing kidney stones. Ultrasound performed yesterday shows multiple gallstones, largest at 7.5 mm. Gallbladder wall is thickened at 4 mm. Common bile duct normal at 5 mm. Fatty liver. Lab work shows WBC 16,900, hemoglobin 13.8. Lipase is 68. Liver function tests normal. She is feeling better today. She still has some pain. Her mother is with her.  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 Tobacco use. Denies alcohol.   Abdominal Pain Associated symptoms: cough, diarrhea, nausea and vomiting   Associated symptoms: no chest pain, no chills, no constipation, no fever, no hematuria, no sore throat and no vaginal bleeding     Past Medical History   Diagnosis Date  . Seizures   . Asthma   . Kidney stones 2014  . Gall stones 2014    Past Surgical History  Procedure Laterality Date  . Tonsillectomy      History reviewed. No pertinent family history.  Social History History  Substance Use Topics  . Smoking status: Current Every Day Smoker -- 0.50 packs/day    Types: Cigarettes  . Smokeless tobacco: Not on file  . Alcohol Use: No    No Known Allergies  Current Outpatient Prescriptions  Medication Sig Dispense Refill  . albuterol (PROVENTIL HFA;VENTOLIN HFA) 108 (90 BASE) MCG/ACT inhaler Inhale 4 puffs into the lungs every 6 (six) hours as needed. For ease of Breathing      . cetirizine (ZYRTEC) 10 MG tablet Take 10 mg by mouth daily.      . ciprofloxacin (CIPRO) 500 MG tablet Take 1 tablet (500 mg total) by mouth 2 (two) times daily.  20 tablet  0  . docusate sodium (COLACE) 100 MG capsule Take 1 capsule (100 mg total) by mouth every 12 (twelve) hours.  60 capsule  0  . hydrOXYzine (VISTARIL) 25 MG capsule Take 25 mg by mouth at bedtime as needed for itching.       . ondansetron (ZOFRAN ODT) 4 MG disintegrating tablet Take 1 tablet (4 mg total) by mouth every 8 (eight) hours as needed for nausea.  20 tablet  0  . phenytoin (DILANTIN) 100 MG ER capsule Take 100 mg by mouth 3 (three) times daily.        No current facility-administered medications for this visit.  Review of Systems Review of Systems  Constitutional: Negative for fever, chills and unexpected weight change.  HENT: Negative for congestion, hearing loss, sore throat, trouble swallowing and voice change.   Eyes: Negative for visual disturbance.  Respiratory: Positive for cough. Negative for wheezing.   Cardiovascular: Negative for chest pain, palpitations and leg swelling.  Gastrointestinal: Positive for nausea, vomiting, abdominal pain and diarrhea. Negative for constipation, blood in stool, abdominal distention and anal bleeding.  Genitourinary: Negative  for hematuria, vaginal bleeding and difficulty urinating.  Musculoskeletal: Negative for arthralgias.  Skin: Negative for rash and wound.  Neurological: Negative for seizures, syncope and headaches.  Hematological: Negative for adenopathy. Does not bruise/bleed easily.  Psychiatric/Behavioral: Negative for confusion.    Blood pressure 126/78, pulse 73, temperature 97.8 F (36.6 C), height 5\' 3"  (1.6 m), weight 236 lb 3.2 oz (107.14 kg).  Physical Exam Physical Exam  Constitutional: She is oriented to person, place, and time. She appears well-developed and well-nourished. No distress.  BMI 41.84  HENT:  Head: Normocephalic and atraumatic.  Nose: Nose normal.  Mouth/Throat: No oropharyngeal exudate.  Eyes: Conjunctivae and EOM are normal. Pupils are equal, round, and reactive to light. Left eye exhibits no discharge. No scleral icterus.  Neck: Neck supple. No JVD present. No tracheal deviation present. No thyromegaly present.  Cardiovascular: Normal rate, regular rhythm, normal heart sounds and intact distal pulses.   No murmur heard. Pulmonary/Chest: Effort normal and breath sounds normal. No respiratory distress. She has no wheezes. She has no rales. She exhibits no tenderness.  Abdominal: Soft. Bowel sounds are normal. She exhibits no distension and no mass. There is no tenderness. There is no rebound and no guarding.  Tender right upper quadrant. Minimal guarding. Not distended. No mass. No scars. No hernias.  Musculoskeletal: She exhibits no edema and no tenderness.  Lymphadenopathy:    She has no cervical adenopathy.  Neurological: She is alert and oriented to person, place, and time. She exhibits normal muscle tone. Coordination normal.  Skin: Skin is warm. No rash noted. She is not diaphoretic. No erythema. No pallor.  Psychiatric: She has a normal mood and affect. Her behavior is normal. Judgment and thought content normal.    Data Reviewed ED physician noted. Labs.  Ultrasound  Assessment    Chronic cholecystitis with cholelithiasis, possibly resolving episode of acute cholecystitis. She will need cholecystectomy in the near future in hopes of permitting more significant complications.  Morbid obesity  Tobacco abuse  Seizure disorder  Anxiety and depression  Asthma     Plan    Scheduled for laparoscopic cholecystectomy with cholangiogram, possible open cholecystectomy  I discussed the indications, details, techniques, and numerous risks of the surgery with her. She is aware of the risk of bleeding, infection, conversion to open laparotomy, bile leak, injury to adjacent organs with major reconstructive surgery, cardiac, pulmonary, and thromboembolic problems. I've reviewed patient information booklet with her in detail and given this to her. She understands all these issues. All of her questions are answered. She agrees with this plan.        Edsel Petrin. Dalbert Batman, M.D., [facility] General and Minimally invasive Surgery Breast and Colorectal Surgery Office:   [phone] Pager:   [phone]  06/07/2013, 3:37 PM

## 2013-06-07 NOTE — Patient Instructions (Signed)
You have been having gallbladder attacks for a year, and they have gotten worse recently.  You have gallstones on her ultrasound, and so you have a permanently diseased gallbladder.  you will be scheduled for laparoscopic cholecystectomy with cholangiogram, possible open cholecystectomy in the near future.  In the meantime, take the antibiotics and I have given you, drink lots and lots of water, and follow a low fat diet.      Laparoscopic Cholecystectomy Laparoscopic cholecystectomy is surgery to remove the gallbladder. The gallbladder is located in the upper right part of the abdomen, behind the liver. It is a storage sac for bile produced in the liver. Bile aids in the digestion and absorption of fats. Cholecystectomy is often done for inflammation of the gallbladder (cholecystitis). This condition is usually caused by a buildup of gallstones (cholelithiasis) in your gallbladder. Gallstones can block the flow of bile, resulting in inflammation and pain. In severe cases, emergency surgery may be required. When emergency surgery is not required, you will have time to prepare for the procedure. Laparoscopic surgery is an alternative to open surgery. Laparoscopic surgery has a shorter recovery time. Your common bile duct may also need to be examined during the procedure. If stones are found in the common bile duct, they may be removed. LET [facility] CARE PROVIDER KNOW ABOUT:  Any allergies you have.  All medicines you are taking, including vitamins, herbs, eye drops, creams, and over-the-counter medicines.  Previous problems you or members of your family have had with the use of anesthetics.  Any blood disorders you have.  Previous surgeries you have had.  Medical conditions you have. RISKS AND COMPLICATIONS Generally, this is a safe procedure. However, as with any procedure, complications can occur. Possible complications include:  Infection.  Damage to the common bile duct,  nerves, arteries, veins, or other internal organs such as the stomach, liver, or intestines.  Bleeding.  A stone may remain in the common bile duct.  A bile leak from the cyst duct that is clipped when your gallbladder is removed.  The need to convert to open surgery, which requires a larger incision in the abdomen. This may be necessary if your surgeon thinks it is not safe to continue with a laparoscopic procedure. BEFORE THE PROCEDURE  Ask your health care provider about changing or stopping any regular medicines. You will need to stop taking aspirin or blood thinners at least 5 days prior to surgery.  Do not eat or drink anything after midnight the night before surgery.  Let your health care provider know if you develop a cold or other infectious problem before surgery. PROCEDURE   You will be given medicine to make you sleep through the procedure (general anesthetic). A breathing tube will be placed in your mouth.  When you are asleep, your surgeon will make several small cuts (incisions) in your abdomen.  A thin, lighted tube with a tiny camera on the end (laparoscope) is inserted through one of the small incisions. The camera on the laparoscope sends a picture to a TV screen in the operating room. This gives the surgeon a good view inside your abdomen.  A gas will be pumped into your abdomen. This expands your abdomen so that the surgeon has more room to perform the surgery.  Other tools needed for the procedure are inserted through the other incisions. The gallbladder is removed through one of the incisions.  After the removal of your gallbladder, the incisions will be closed with stitches,  staples, or skin glue. AFTER THE PROCEDURE  You will be taken to a recovery area where your progress will be checked often.  You may be allowed to go home the same day if your pain is controlled and you can tolerate liquids. Document Released: 02/02/2005 Document Revised: 11/23/2012  Document Reviewed: 09/14/2012 [facility] Patient Information 2014 [place].

## 2013-06-16 ENCOUNTER — Encounter (HOSPITAL_COMMUNITY): Payer: Self-pay | Admitting: Pharmacy Technician

## 2013-06-19 ENCOUNTER — Ambulatory Visit (HOSPITAL_COMMUNITY)
Admission: RE | Admit: 2013-06-19 | Discharge: 2013-06-19 | Disposition: A | Discharge status: Home / Self Care | Payer: Medicare Other | Source: Ambulatory Visit | Attending: General Surgery | Admitting: General Surgery

## 2013-06-19 ENCOUNTER — Encounter (HOSPITAL_COMMUNITY): Payer: Self-pay

## 2013-06-19 ENCOUNTER — Encounter (HOSPITAL_COMMUNITY)
Admission: RE | Admit: 2013-06-19 | Discharge: 2013-06-19 | Disposition: A | Discharge status: Home / Self Care | Payer: Medicare Other | Source: Ambulatory Visit | Attending: General Surgery | Admitting: General Surgery

## 2013-06-19 DIAGNOSIS — Z01812 Encounter for preprocedural laboratory examination: Secondary | ICD-10-CM | POA: Insufficient documentation

## 2013-06-19 DIAGNOSIS — Z01810 Encounter for preprocedural cardiovascular examination: Secondary | ICD-10-CM | POA: Insufficient documentation

## 2013-06-19 DIAGNOSIS — Z01818 Encounter for other preprocedural examination: Secondary | ICD-10-CM | POA: Insufficient documentation

## 2013-06-19 DIAGNOSIS — R9431 Abnormal electrocardiogram [ECG] [EKG]: Secondary | ICD-10-CM | POA: Insufficient documentation

## 2013-06-19 HISTORY — DX: Gastro-esophageal reflux disease without esophagitis: K21.9

## 2013-06-19 HISTORY — DX: Family history of other specified conditions: Z84.89

## 2013-06-19 HISTORY — DX: Headache: R51

## 2013-06-19 HISTORY — DX: Pneumonia, unspecified organism: J18.9

## 2013-06-19 HISTORY — DX: Personal history of urinary calculi: Z87.442

## 2013-06-19 HISTORY — DX: Pure hypercholesterolemia, unspecified: E78.00

## 2013-06-19 HISTORY — DX: Palpitations: R00.2

## 2013-06-19 HISTORY — DX: Cardiac murmur, unspecified: R01.1

## 2013-06-19 LAB — COMPREHENSIVE METABOLIC PANEL
ALBUMIN: 3.6 g/dL (ref 3.5–5.2)
ALT: 12 U/L (ref 0–35)
AST: 13 U/L (ref 0–37)
Alkaline Phosphatase: 93 U/L (ref 39–117)
BUN: 10 mg/dL (ref 6–23)
CO2: 24 mEq/L (ref 19–32)
Calcium: 9.1 mg/dL (ref 8.4–10.5)
Chloride: 104 mEq/L (ref 96–112)
Creatinine, Ser: 0.71 mg/dL (ref 0.50–1.10)
GFR calc Af Amer: >90 mL/min (ref >90)
GFR calc non Af Amer: >90 mL/min (ref >90)
Glucose, Bld: 90 mg/dL (ref 70–99)
Potassium: 4 mEq/L (ref 3.7–5.3)
Sodium: 142 mEq/L (ref 137–147)
Total Protein: 7.6 g/dL (ref 6.0–8.3)

## 2013-06-19 LAB — CBC WITH DIFFERENTIAL/PLATELET
BASOS ABS: 0 10*3/uL (ref 0.0–0.1)
BASOS PCT: 0 % (ref 0–1)
Eosinophils Absolute: 0.2 10*3/uL (ref 0.0–0.7)
Eosinophils Relative: 2 % (ref 0–5)
HCT: 41.7 % (ref 36.0–46.0)
Hemoglobin: 13.9 g/dL (ref 12.0–15.0)
Lymphocytes Relative: 28 % (ref 12–46)
Lymphs Abs: 3.3 10*3/uL (ref 0.7–4.0)
MCH: 27.5 pg (ref 26.0–34.0)
MCHC: 33.3 g/dL (ref 30.0–36.0)
MCV: 82.4 fL (ref 78.0–100.0)
MONO ABS: 0.6 10*3/uL (ref 0.1–1.0)
Monocytes Relative: 5 % (ref 3–12)
NEUTROS ABS: 7.9 10*3/uL — AB (ref 1.7–7.7)
Neutrophils Relative %: 66 % (ref 43–77)
PLATELETS: 328 10*3/uL (ref 150–400)
RBC: 5.06 MIL/uL (ref 3.87–5.11)
RDW: 14.5 % (ref 11.5–15.5)
WBC: 12 10*3/uL — ABNORMAL HIGH (ref 4.0–10.5)

## 2013-06-19 LAB — HCG, SERUM, QUALITATIVE: PREG SERUM: NEGATIVE

## 2013-06-19 LAB — LIPASE, BLOOD: Lipase: 42 U/L (ref 11–59)

## 2013-06-19 NOTE — Patient Instructions (Addendum)
[place] [facility]  06/19/2013   Your procedure is scheduled on: 06/28/13  Report to [facility] at 6:30 AM.  Call this number if you have problems the morning of surgery 336-: [phone]   Remember: please bring inhaler on day of surgery    Do not eat food or drink liquids After Midnight.     Take these medicines the morning of surgery with A SIP OF WATER: dilantin, albuterol   Do not wear jewelry, make-up or nail polish.  Do not wear lotions, powders, or perfumes. You may wear deodorant.  Do not shave 48 hours prior to surgery. Men may shave face and neck.  Do not bring valuables to the hospital.  Contacts, dentures or bridgework may not be worn into surgery.   Patients discharged the day of surgery will not be allowed to drive home.  Name and phone number of your driver: Danne Harbor [phone]  Paulette Blanch, RN  pre op nurse call if needed [phone]    [facility] - Preparing for Surgery Before surgery, you can play an important role.  Because skin is not sterile, your skin needs to be as free of germs as possible.  You can reduce the number of germs on your skin by washing with CHG (chlorahexidine gluconate) soap before surgery.  CHG is an antiseptic cleaner which kills germs and bonds with the skin to continue killing germs even after washing. Please DO NOT use if you have an allergy to CHG or antibacterial soaps.  If your skin becomes reddened/irritated stop using the CHG and inform your nurse when you arrive at Short Stay. Do not shave (including legs and underarms) for at least 48 hours prior to the first CHG shower.  You may shave your face. Please follow these instructions carefully:  1.  Shower with CHG Soap the night before surgery and the  morning of Surgery.  2.  If you choose to wash your hair, wash your hair first as usual with your  normal  shampoo.  3.  After you shampoo, rinse your hair and body thoroughly to remove the  shampoo.                           4.  Use CHG as you would any other liquid soap.  You can apply chg directly  to the skin and wash                       Gently with a scrungie or clean washcloth.  5.  Apply the CHG Soap to your body ONLY FROM THE NECK DOWN.   Do not use on open                           Wound or open sores. Avoid contact with eyes, ears mouth and genitals (private parts).                        Genitals (private parts) with your normal soap.             6.  Wash thoroughly, paying special attention to the area where your surgery  will be performed.  7.  Thoroughly rinse your body with warm water from the neck down.  8.  DO NOT shower/wash with your normal soap after using and rinsing off  the CHG Soap.  9.  Pat yourself dry with a clean towel.            10.  Wear clean pajamas.            11.  Place clean sheets on your bed the night of your first shower and do not  sleep with pets. Day of Surgery : Do not apply any lotions/deodorants the morning of surgery.  Please wear clean clothes to the hospital/surgery center.  FAILURE TO FOLLOW THESE INSTRUCTIONS MAY RESULT IN THE CANCELLATION OF YOUR SURGERY PATIENT SIGNATURE_________________________________  NURSE SIGNATURE__________________________________  ________________________________________________________________________

## 2013-06-23 ENCOUNTER — Ambulatory Visit (INDEPENDENT_AMBULATORY_CARE_PROVIDER_SITE_OTHER): Payer: Medicare Other | Admitting: Surgery

## 2013-06-26 ENCOUNTER — Encounter (HOSPITAL_COMMUNITY): Payer: Self-pay | Admitting: Emergency Medicine

## 2013-06-26 ENCOUNTER — Other Ambulatory Visit: Payer: Self-pay

## 2013-06-26 ENCOUNTER — Telehealth (INDEPENDENT_AMBULATORY_CARE_PROVIDER_SITE_OTHER): Payer: Self-pay

## 2013-06-26 ENCOUNTER — Emergency Department (HOSPITAL_COMMUNITY)
Admission: EM | Admit: 2013-06-26 | Discharge: 2013-06-27 | Disposition: A | Discharge status: Home / Self Care | Payer: Medicare Other | Attending: Emergency Medicine | Admitting: Emergency Medicine

## 2013-06-26 DIAGNOSIS — R002 Palpitations: Secondary | ICD-10-CM | POA: Insufficient documentation

## 2013-06-26 DIAGNOSIS — Z8639 Personal history of other endocrine, nutritional and metabolic disease: Secondary | ICD-10-CM | POA: Insufficient documentation

## 2013-06-26 DIAGNOSIS — F17200 Nicotine dependence, unspecified, uncomplicated: Secondary | ICD-10-CM | POA: Insufficient documentation

## 2013-06-26 DIAGNOSIS — N390 Urinary tract infection, site not specified: Secondary | ICD-10-CM | POA: Insufficient documentation

## 2013-06-26 DIAGNOSIS — R011 Cardiac murmur, unspecified: Secondary | ICD-10-CM | POA: Insufficient documentation

## 2013-06-26 DIAGNOSIS — G40909 Epilepsy, unspecified, not intractable, without status epilepticus: Secondary | ICD-10-CM | POA: Insufficient documentation

## 2013-06-26 DIAGNOSIS — K8020 Calculus of gallbladder without cholecystitis without obstruction: Secondary | ICD-10-CM | POA: Insufficient documentation

## 2013-06-26 DIAGNOSIS — Z8701 Personal history of pneumonia (recurrent): Secondary | ICD-10-CM | POA: Insufficient documentation

## 2013-06-26 DIAGNOSIS — D72829 Elevated white blood cell count, unspecified: Secondary | ICD-10-CM | POA: Insufficient documentation

## 2013-06-26 DIAGNOSIS — Z79899 Other long term (current) drug therapy: Secondary | ICD-10-CM | POA: Insufficient documentation

## 2013-06-26 DIAGNOSIS — K8050 Calculus of bile duct without cholangitis or cholecystitis without obstruction: Secondary | ICD-10-CM

## 2013-06-26 DIAGNOSIS — Z862 Personal history of diseases of the blood and blood-forming organs and certain disorders involving the immune mechanism: Secondary | ICD-10-CM | POA: Insufficient documentation

## 2013-06-26 DIAGNOSIS — Z3202 Encounter for pregnancy test, result negative: Secondary | ICD-10-CM | POA: Insufficient documentation

## 2013-06-26 DIAGNOSIS — Z87442 Personal history of urinary calculi: Secondary | ICD-10-CM | POA: Insufficient documentation

## 2013-06-26 DIAGNOSIS — Z792 Long term (current) use of antibiotics: Secondary | ICD-10-CM | POA: Insufficient documentation

## 2013-06-26 DIAGNOSIS — J45909 Unspecified asthma, uncomplicated: Secondary | ICD-10-CM | POA: Insufficient documentation

## 2013-06-26 NOTE — ED Notes (Signed)
Pt was here last week for her pre surgical work up to have her gallbladder out this week and her EKG was abnormal, tonight she said she was feeling palpitations

## 2013-06-26 NOTE — Telephone Encounter (Signed)
Message copied by Dois Davenport on Mon Jun 26, 2013  5:25 PM ------      Message from: Joya San      Created: Mon Jun 26, 2013  3:09 PM      Contact: [phone]       Pt has question concerning her pre op appt that she had. ------

## 2013-06-26 NOTE — Telephone Encounter (Signed)
Reviewed with Dr Dalbert Batman. Per Dr Dalbert Batman do not cx surgery yet. Tomorrow call Dr Gala Murdoch office to review ekg and ask if he will clear pt for surgery on 06-28-13. Also call [facility] pre admit and find out what anesthesias plan is for this pt. I have pulled ekg and note. Given to Nectar to call and follow on 06-27-13.

## 2013-06-26 NOTE — Telephone Encounter (Signed)
Pt called stating pre admit told her that her EKG was abd pre op. Pt has surgery scheduled 5-13. I advised pt Dr Dalbert Batman will be notified of this to review EKG. Pt can be reached at [phone].

## 2013-06-27 ENCOUNTER — Other Ambulatory Visit: Payer: Self-pay

## 2013-06-27 ENCOUNTER — Emergency Department (HOSPITAL_COMMUNITY): Payer: Medicare Other

## 2013-06-27 LAB — URINALYSIS, ROUTINE W REFLEX MICROSCOPIC
Bilirubin Urine: NEGATIVE
GLUCOSE, UA: NEGATIVE mg/dL
Hgb urine dipstick: NEGATIVE
Ketones, ur: NEGATIVE mg/dL
Nitrite: NEGATIVE
Protein, ur: NEGATIVE mg/dL
SPECIFIC GRAVITY, URINE: 1.014 (ref 1.005–1.030)
Urobilinogen, UA: 0.2 mg/dL (ref 0.0–1.0)
pH: 5 (ref 5.0–8.0)

## 2013-06-27 LAB — BASIC METABOLIC PANEL
BUN: 10 mg/dL (ref 6–23)
CO2: 24 meq/L (ref 19–32)
Calcium: 9.8 mg/dL (ref 8.4–10.5)
Chloride: 101 mEq/L (ref 96–112)
Creatinine, Ser: 0.79 mg/dL (ref 0.50–1.10)
GFR calc Af Amer: >90 mL/min (ref >90)
GFR calc non Af Amer: >90 mL/min (ref >90)
GLUCOSE: 104 mg/dL — AB (ref 70–99)
Potassium: 3.9 mEq/L (ref 3.7–5.3)
Sodium: 139 mEq/L (ref 137–147)

## 2013-06-27 LAB — CBC
HEMATOCRIT: 41.6 % (ref 36.0–46.0)
HEMOGLOBIN: 14.1 g/dL (ref 12.0–15.0)
MCH: 27.9 pg (ref 26.0–34.0)
MCHC: 33.9 g/dL (ref 30.0–36.0)
MCV: 82.4 fL (ref 78.0–100.0)
Platelets: 339 10*3/uL (ref 150–400)
RBC: 5.05 MIL/uL (ref 3.87–5.11)
RDW: 14.4 % (ref 11.5–15.5)
WBC: 16.8 10*3/uL — AB (ref 4.0–10.5)

## 2013-06-27 LAB — HEPATIC FUNCTION PANEL
ALBUMIN: 3.6 g/dL (ref 3.5–5.2)
ALT: 14 U/L (ref 0–35)
AST: 12 U/L (ref 0–37)
Alkaline Phosphatase: 86 U/L (ref 39–117)
Bilirubin, Direct: <0.2 mg/dL (ref 0.0–0.3)
Total Bilirubin: <0.2 mg/dL — ABNORMAL LOW (ref 0.3–1.2)
Total Protein: 7.6 g/dL (ref 6.0–8.3)

## 2013-06-27 LAB — LIPASE, BLOOD: LIPASE: 57 U/L (ref 11–59)

## 2013-06-27 LAB — URINE MICROSCOPIC-ADD ON

## 2013-06-27 LAB — TROPONIN I
Troponin I: <0.3 ng/mL (ref <0.30)
Troponin I: <0.3 ng/mL (ref <0.30)

## 2013-06-27 LAB — PREGNANCY, URINE: Preg Test, Ur: NEGATIVE

## 2013-06-27 MED ORDER — HYDROCODONE-ACETAMINOPHEN 5-325 MG PO TABS
1.0000 | ORAL_TABLET | Freq: Once | ORAL | Status: AC
  Administered 2013-06-27: 1 via ORAL
  Filled 2013-06-27: qty 1

## 2013-06-27 MED ORDER — ONDANSETRON HCL 4 MG PO TABS: 4.0000 mg | ORAL_TABLET | Freq: Four times a day (QID) | ORAL | Status: DC

## 2013-06-27 MED ORDER — FENTANYL CITRATE 0.05 MG/ML IJ SOLN
25.0000 ug | Freq: Once | INTRAMUSCULAR | Status: AC
  Administered 2013-06-27: 25 ug via INTRAVENOUS
  Filled 2013-06-27: qty 2

## 2013-06-27 MED ORDER — CIPROFLOXACIN HCL 500 MG PO TABS: 500.0000 mg | ORAL_TABLET | Freq: Two times a day (BID) | ORAL | Status: DC

## 2013-06-27 NOTE — Telephone Encounter (Signed)
Per Apolonio Schneiders at [facility] pre admit, the EKG from yesterday has improved since the abnormal one at pts pre admit appt on 5/4. She will have anesthesia look at EKG and call me back if surgery needs to be cancelled.

## 2013-06-27 NOTE — Progress Notes (Signed)
Received call from [facility] with [place]. Pt went to ed 06/26/13 with heart palpitations and EKG was performed. EKG 06/27/13 shown to Dr. Marcell Barlow with anesthesia. Dr. Marcell Barlow was informed that she will be coming tomorrow for surgery and she had heart palpitations last night. No further action required.

## 2013-06-27 NOTE — Telephone Encounter (Signed)
Per Dr Brunetta Genera office, pt is no longer being followed by their office. She no showed/cancelled all her appts over the last year.

## 2013-06-27 NOTE — ED Provider Notes (Signed)
CSN: [id number]     Arrival date & time 06/26/13  2242 History   First MD Initiated Contact with Patient 06/27/13 0107     Chief Complaint  Patient presents with  . Irregular Heart Beat     (Consider location/radiation/quality/duration/timing/severity/associated sxs/prior Treatment) The history is provided by the patient. No language interpreter was used.  Lisa Simpson 38 year old female with past medical history of asthma, gallstones, kidney stones, seizures, heart palpitations, GERD presenting to the ED with palpitations-feelings as if her heart is skipping a beat that started last Thursday. Patient reports that she had a workup performed since patient is due to get laparoscopic cholecystectomy performed on Wednesday. Reported that during the workup EKG was noted to be "abnormal" where patient was to followup regarding her arrhythmia that was identified on EKG. Stated that when she was younger she'll was had an issue with a heart murmur-reported that she's experienced heart palpitations in the past. Stated that she has history of heartburn, but denies any chest pain or discomfort. Reports she smokes approximately half a pack of cigarettes per day. Denied chest pain, shortness of breath, difficulty breathing, vomiting, diarrhea, fever, cough, birth control, long travels, sweating, dyspnea upon exertion. PCP Dr. Brunetta Genera   Past Medical History  Diagnosis Date  . Asthma   . Gall stones 2014  . History of kidney stones     2 currently  . Seizures 2013  . Heart palpitations     "feels fluttery"  . Heart murmur   . Borderline hypercholesterolemia   . Pneumonia oct 2014    hx of  . GERD (gastroesophageal reflux disease)   . Headache(784.0)     occasional  . Family history of anesthesia complication     "dad woke up during surgery"   Past Surgical History  Procedure Laterality Date  . Tonsillectomy      as child  . Multiple tooth extractions     History reviewed. No pertinent  family history. History  Substance Use Topics  . Smoking status: Current Every Day Smoker -- 0.50 packs/day for 12 years    Types: Cigarettes  . Smokeless tobacco: Never Used  . Alcohol Use: No   OB History   Grav Para Term Preterm Abortions TAB SAB Ect Mult Living   '3 3        3     '$ Review of Systems  Constitutional: Negative for fever and chills.  Respiratory: Negative for chest tightness and shortness of breath.   Cardiovascular: Positive for palpitations. Negative for chest pain and leg swelling.  Gastrointestinal: Positive for nausea and abdominal pain (ruq). Negative for vomiting, diarrhea, constipation, blood in stool and anal bleeding.  Musculoskeletal: Negative for back pain and neck pain.  Neurological: Negative for dizziness, weakness and headaches.  All other systems reviewed and are negative.     Allergies  Morphine and related  Home Medications   Prior to Admission medications   Medication Sig Start Date End Date Taking? Authorizing Provider  albuterol (PROVENTIL HFA;VENTOLIN HFA) 108 (90 BASE) MCG/ACT inhaler Inhale 4 puffs into the lungs every 6 (six) hours as needed. For ease of Breathing   Yes Historical Provider, MD  ciprofloxacin (CIPRO) 500 MG tablet Take 500 mg by mouth 2 (two) times daily.   Yes Historical Provider, MD  hydrOXYzine (VISTARIL) 25 MG capsule Take 25 mg by mouth at bedtime as needed for itching.    Yes Historical Provider, MD  Linaclotide Rolan Lipa) 290 MCG CAPS capsule Take  290 mcg by mouth daily.   Yes Historical Provider, MD  Naproxen Sodium (ALEVE) 220 MG CAPS Take 440 mg by mouth 2 (two) times daily as needed (Pain).   Yes Historical Provider, MD  ondansetron (ZOFRAN-ODT) 8 MG disintegrating tablet Take 8 mg by mouth every 8 (eight) hours as needed for nausea or vomiting (nausea).   Yes Historical Provider, MD  phenytoin (DILANTIN) 100 MG ER capsule Take 100 mg by mouth 2 (two) times daily.    Yes Historical Provider, MD  traMADol  (ULTRAM) 50 MG tablet Take 50 mg by mouth every 6 (six) hours as needed (pain).   Yes Historical Provider, MD   BP 91/57  Pulse 77  Temp(Src) 98.5 F (36.9 C) (Oral)  Resp 21  Ht $R'5\' 2"'Xn$  (1.575 m)  Wt 231 lb (104.781 kg)  BMI 42.24 kg/m2  SpO2 97%  LMP 06/18/2013 Physical Exam  Nursing note and vitals reviewed. Constitutional: She is oriented to person, place, and time. She appears well-developed and well-nourished. No distress.  HENT:  Head: Normocephalic and atraumatic.  Mouth/Throat: Oropharynx is clear and moist. No oropharyngeal exudate.  Eyes: Conjunctivae and EOM are normal. Pupils are equal, round, and reactive to light. Right eye exhibits no discharge. Left eye exhibits no discharge.  Neck: Normal range of motion. Neck supple. No tracheal deviation present.  Cardiovascular: Normal rate, regular rhythm and normal heart sounds.  Exam reveals no friction rub.   No murmur heard. Cap refill < 3 seconds Negative swelling or pitting edema noted to the lower extremities bilaterally   Pulmonary/Chest: Effort normal and breath sounds normal. No respiratory distress. She has no wheezes. She has no rales.  Abdominal: Soft. Bowel sounds are normal. She exhibits no distension. There is tenderness. There is no rebound and no guarding.  Obese Negative abdominal distension noted Discomfort upon palpation to the RUQ  Musculoskeletal: Normal range of motion.  Lymphadenopathy:    She has no cervical adenopathy.  Neurological: She is alert and oriented to person, place, and time. No cranial nerve deficit. She exhibits normal muscle tone. Coordination normal.  Skin: Skin is warm and dry. No rash noted. She is not diaphoretic. No erythema.  Psychiatric: She has a normal mood and affect. Her behavior is normal. Thought content normal.    ED Course  Procedures (including critical care time)  7:29 AM This provider re-assessed the patient. Patient reported that she is doing well. Discussed labs  and imaging in great detail with patient. Discussed with patient fluid challenge and plan for discharge. Patient has scheduled laparoscopic cholecystectomy with Dr. Dalbert Batman at 6:00AM tomorrow morning - 06/28/2013.    7:43 AM This provider made aware that patient tolerated fluids PO without difficulty. Negative episodes of emesis while in ED setting.   Results for orders placed during the hospital encounter of 06/26/13  CBC      Result Value Ref Range   WBC 16.8 (*) 4.0 - 10.5 K/uL   RBC 5.05  3.87 - 5.11 MIL/uL   Hemoglobin 14.1  12.0 - 15.0 g/dL   HCT 41.6  36.0 - 46.0 %   MCV 82.4  78.0 - 100.0 fL   MCH 27.9  26.0 - 34.0 pg   MCHC 33.9  30.0 - 36.0 g/dL   RDW 14.4  11.5 - 15.5 %   Platelets 339  150 - 400 K/uL  BASIC METABOLIC PANEL      Result Value Ref Range   Sodium 139  137 - 147 mEq/L  Potassium 3.9  3.7 - 5.3 mEq/L   Chloride 101  96 - 112 mEq/L   CO2 24  19 - 32 mEq/L   Glucose, Bld 104 (*) 70 - 99 mg/dL   BUN 10  6 - 23 mg/dL   Creatinine, Ser 0.79  0.50 - 1.10 mg/dL   Calcium 9.8  8.4 - 10.5 mg/dL   GFR calc non Af Amer >90  >90 mL/min   GFR calc Af Amer >90  >90 mL/min  TROPONIN I      Result Value Ref Range   Troponin I <0.30  <0.30 ng/mL  PREGNANCY, URINE      Result Value Ref Range   Preg Test, Ur NEGATIVE  NEGATIVE  URINALYSIS, ROUTINE W REFLEX MICROSCOPIC      Result Value Ref Range   Color, Urine YELLOW  YELLOW   APPearance CLOUDY (*) CLEAR   Specific Gravity, Urine 1.014  1.005 - 1.030   pH 5.0  5.0 - 8.0   Glucose, UA NEGATIVE  NEGATIVE mg/dL   Hgb urine dipstick NEGATIVE  NEGATIVE   Bilirubin Urine NEGATIVE  NEGATIVE   Ketones, ur NEGATIVE  NEGATIVE mg/dL   Protein, ur NEGATIVE  NEGATIVE mg/dL   Urobilinogen, UA 0.2  0.0 - 1.0 mg/dL   Nitrite NEGATIVE  NEGATIVE   Leukocytes, UA MODERATE (*) NEGATIVE  LIPASE, BLOOD      Result Value Ref Range   Lipase 57  11 - 59 U/L  URINE MICROSCOPIC-ADD ON      Result Value Ref Range   Squamous Epithelial /  LPF MANY (*) RARE   WBC, UA 7-10  <3 WBC/hpf   RBC / HPF 0-2  <3 RBC/hpf   Bacteria, UA FEW (*) RARE  TROPONIN I      Result Value Ref Range   Troponin I <0.30  <0.30 ng/mL  HEPATIC FUNCTION PANEL      Result Value Ref Range   Total Protein 7.6  6.0 - 8.3 g/dL   Albumin 3.6  3.5 - 5.2 g/dL   AST 12  0 - 37 U/L   ALT 14  0 - 35 U/L   Alkaline Phosphatase 86  39 - 117 U/L   Total Bilirubin <0.2 (*) 0.3 - 1.2 mg/dL   Bilirubin, Direct <0.2  0.0 - 0.3 mg/dL   Indirect Bilirubin NOT CALCULATED  0.3 - 0.9 mg/dL    Labs Review Labs Reviewed  CBC - Abnormal; Notable for the following:    WBC 16.8 (*)    All other components within normal limits  BASIC METABOLIC PANEL - Abnormal; Notable for the following:    Glucose, Bld 104 (*)    All other components within normal limits  URINALYSIS, ROUTINE W REFLEX MICROSCOPIC - Abnormal; Notable for the following:    APPearance CLOUDY (*)    Leukocytes, UA MODERATE (*)    All other components within normal limits  URINE MICROSCOPIC-ADD ON - Abnormal; Notable for the following:    Squamous Epithelial / LPF MANY (*)    Bacteria, UA FEW (*)    All other components within normal limits  HEPATIC FUNCTION PANEL - Abnormal; Notable for the following:    Total Bilirubin <0.2 (*)    All other components within normal limits  TROPONIN I  PREGNANCY, URINE  LIPASE, BLOOD  TROPONIN I    Imaging Review Dg Chest 2 View  06/27/2013   CLINICAL DATA:  Irregular heartbeat.  Abnormal EKG.  EXAM: CHEST  2  VIEW  COMPARISON:  DG CHEST 2 VIEW dated 06/19/2013  FINDINGS: The heart size and mediastinal contours are within normal limits. Both lungs are clear. The visualized skeletal structures are unremarkable.  IMPRESSION: No active cardiopulmonary disease.   Electronically Signed   By: Lucienne Capers M.D.   On: 06/27/2013 03:56   US Abdomen Limited Ruq  06/27/2013   CLINICAL DATA:  Right upper quadrant pain and elevated white cell count.  EXAM: US ABDOMEN  LIMITED - RIGHT UPPER QUADRANT  COMPARISON:  US ABDOMEN LIMITED dated 06/06/2013  FINDINGS: Gallbladder:  Cholelithiasis with contracted gallbladder and thickened gallbladder wall. Murphy's sign is positive. Changes are consistent with acute cholecystitis in the appropriate clinical setting. Similar appearance to previous study.  Common bile duct:  Diameter: 4.6 mm, normal  Liver:  Increased parenchymal echotexture suggesting fatty infiltration. No focal liver lesions identified.  IMPRESSION: Cholelithiasis with contracted thick-walled gallbladder and positive Murphy's sign. Changes are consistent with acute cholecystitis in the appropriate clinical setting.   Electronically Signed   By: Lucienne Capers M.D.   On: 06/27/2013 03:48     EKG Interpretation None      Date: 06/27/2013  Rate: 94  Rhythm: normal sinus rhythm  QRS Axis: normal  Intervals: normal  ST/T Wave abnormalities: normal  Conduction Disutrbances:none  Narrative Interpretation: low voltage, abnormal inferior Q waves  Old EKG Reviewed: unchanged EKG analyzed and reviewed by this provider and attending physician.    MDM   Final diagnoses:  Biliary colic  Leukocytosis  UTI (lower urinary tract infection)   Medications  fentaNYL (SUBLIMAZE) injection 25 mcg (25 mcg Intravenous Given 06/27/13 0541)  HYDROcodone-acetaminophen (NORCO/VICODIN) 5-325 MG per tablet 1 tablet (1 tablet Oral Given 06/27/13 0755)   Filed Vitals:   06/27/13 0130 06/27/13 0257 06/27/13 0517 06/27/13 0757  BP: 137/70 118/63 107/68 91/57  Pulse: 81 93 75 77  Temp:  98.5 F (36.9 C)  98.5 F (36.9 C)  TempSrc:  Oral  Oral  Resp: $Remo'12 20 15 21  '[facility]$ Height:      Weight:      SpO2: 96% 96% 92% 97%    This provider reviewed patient's chart. Patient is due to have a scheduled laparoscopic cholecystectomy performed on Wednesday, May 13th 2015. EKG noted normal sinus rhythm with a heart rate of 94 beats per minute with low voltage precordial leads -  negative ischemic findings noted. First troponin negative elevation. Second troponin negative elevation. CBC negative elevated white blood cell count of 16.8-when compared to previous labs patient's white blood cell count is elevated from 12.9 from one week ago. BMP kidneys functioning well. Lipase negative elevation. Hepatic function panel noted low bilirubin - negative elevated AST or ALT noted. Negative elevation of direct bilirubin. Negative elevated alkaline phosphatase. Urine noted moderate leukocytes white blood cells 7-10-negative nitrites noted. Urine pregnancy negative. [place] noted cholelithiasis with contracted thick walled gladder - changes consistent with acute cholecystitis in the appropriate clinical setting.  [place] from today appears to be similar to the [place] performed on 06/06/2013 - no new changes noted. Patient has known cholecystitis with cholelithiasis - patient was seen and assessed by Dr. Dalbert Batman on 06/07/2013. Patient scheduled for cholecystectomy tomorrow morning by Dr. Dalbert Batman - 06/28/2013. CBC noted elevated WBC of 16.8 - but patient had similar finding back in 05/2013. Negative elevated AST or ALT noted. Negative elevated alk phos on labs. Bilirubin negative elevation.  HEART score 2 - patient does not have chest pain. Patient stable, afebrile. Patient  not septic appearing. Patient presenting with known cholecystitis due to cholelithiasis. EKG normal with 2 troponins no elevation. Patient able to tolerate fluids PO - negative episodes of emesis while in the ED setting. Discussed case and imaging/results in great detail with attending physician who agreed to plan of discharge - as per attending physician, does not believe this to be true cholecystitis - reported that patient is able to go home and have surgery performed tomorrow morning. Patient discharged. Discharged on antibiotics for UTI and zofran. Discussed with patient to rest and stay hydrated - discussed proper diet. Discussed with patient to  continue at home medications as prescribed. Discussed with patient to closely monitor symptoms and if symptoms are to worsen or change to report back to the ED - strict return instructions given.  Patient agreed to plan of care, understood, all questions answered.   Jamse Mead, PA-C 06/27/13 [place], PA-C 06/27/13 1621

## 2013-06-27 NOTE — Discharge Instructions (Signed)
Please call your doctor for a followup appointment within 24-48 hours. When you talk to your doctor please let them know that you were seen in the emergency department and have them acquire all of your records so that they can discuss the findings with you and formulate a treatment plan to fully care for your new and ongoing problems. Please keep appointment and scheduled surgery for tomorrow morning for cholecystectomy Please continue to take at home medications as prescribed Please continue with diet - decrease fat, grease, carbohydrates, soda Please take antibiotics as prescribed Please continue to monitor symptoms closely and if symptoms are to worsen or change (fever greater than 101, chills, sweating, nausea, vomiting, chest pain, shortness of breath, difficulty breathing, numbness, tingling, worsening or changes to abdominal pain, blood in the stools, black tarry stools, fainting, weakness) please report back to the ED immediately    Biliary Colic  Biliary colic is a steady or irregular pain in the upper abdomen. It is usually under the right side of the rib cage. It happens when gallstones interfere with the normal flow of bile from the gallbladder. Bile is a liquid that helps to digest fats. Bile is made in the liver and stored in the gallbladder. When you eat a meal, bile passes from the gallbladder through the cystic duct and the common bile duct into the small intestine. There, it mixes with partially digested food. If a gallstone blocks either of these ducts, the normal flow of bile is blocked. The muscle cells in the bile duct contract forcefully to try to move the stone. This causes the pain of biliary colic.  SYMPTOMS   A person with biliary colic usually complains of pain in the upper abdomen. This pain can be:  In the center of the upper abdomen just below the breastbone.  In the upper-right part of the abdomen, near the gallbladder and liver.  Spread back toward the right  shoulder blade.  Nausea and vomiting.  The pain usually occurs after eating.  Biliary colic is usually triggered by the digestive system's demand for bile. The demand for bile is high after fatty meals. Symptoms can also occur when a person who has been fasting suddenly eats a very large meal. Most episodes of biliary colic pass after 1 to 5 hours. After the most intense pain passes, your abdomen may continue to ache mildly for about 24 hours. DIAGNOSIS  After you describe your symptoms, your caregiver will perform a physical exam. He or she will pay attention to the upper right portion of your belly (abdomen). This is the area of your liver and gallbladder. An ultrasound will help your caregiver look for gallstones. Specialized scans of the gallbladder may also be done. Blood tests may be done, especially if you have fever or if your pain persists. PREVENTION  Biliary colic can be prevented by controlling the risk factors for gallstones. Some of these risk factors, such as heredity, increasing age, and pregnancy are a normal part of life. Obesity and a high-fat diet are risk factors you can change through a healthy lifestyle. Women going through menopause who take hormone replacement therapy (estrogen) are also more likely to develop biliary colic. TREATMENT   Pain medication may be prescribed.  You may be encouraged to eat a fat-free diet.  If the first episode of biliary colic is severe, or episodes of colic keep retuning, surgery to remove the gallbladder (cholecystectomy) is usually recommended. This procedure can be done through small incisions using an  instrument called a laparoscope. The procedure often requires a brief stay in the hospital. Some people can leave the hospital the same day. It is the most widely used treatment in people troubled by painful gallstones. It is effective and safe, with no complications in more than 90% of cases.  If surgery cannot be done, medication that  dissolves gallstones may be used. This medication is expensive and can take months or years to work. Only small stones will dissolve.  Rarely, medication to dissolve gallstones is combined with a procedure called shock-wave lithotripsy. This procedure uses carefully aimed shock waves to break up gallstones. In many people treated with this procedure, gallstones form again within a few years. PROGNOSIS  If gallstones block your cystic duct or common bile duct, you are at risk for repeated episodes of biliary colic. There is also a 25% chance that you will develop a gallbladder infection(acute cholecystitis), or some other complication of gallstones within 10 to 20 years. If you have surgery, schedule it at a time that is convenient for you and at a time when you are not sick. HOME CARE INSTRUCTIONS   Drink plenty of clear fluids.  Avoid fatty, greasy or fried foods, or any foods that make your pain worse.  Take medications as directed. SEEK MEDICAL CARE IF:   You develop a fever over 100.5 F (38.1 C).  Your pain gets worse over time.  You develop nausea that prevents you from eating and drinking.  You develop vomiting. SEEK IMMEDIATE MEDICAL CARE IF:   You have continuous or severe belly (abdominal) pain which is not relieved with medications.  You develop nausea and vomiting which is not relieved with medications.  You have symptoms of biliary colic and you suddenly develop a fever and shaking chills. This may signal cholecystitis. Call your caregiver immediately.  You develop a yellow color to your skin or the white part of your eyes (jaundice). Document Released: 07/06/2005 Document Revised: 04/27/2011 Document Reviewed: 09/15/2007 [facility] Patient Information 2014 [place]. Cholelithiasis Cholelithiasis (also called gallstones) is a form of gallbladder disease in which gallstones form in your gallbladder. The gallbladder is an organ that stores bile made in the liver,  which helps digest fats. Gallstones begin as small crystals and slowly grow into stones. Gallstone pain occurs when the gallbladder spasms and a gallstone is blocking the duct. Pain can also occur when a stone passes out of the duct.  RISK FACTORS  Being female.   Having multiple pregnancies. Health care providers sometimes advise removing diseased gallbladders before future pregnancies.   Being obese.  Eating a diet heavy in fried foods and fat.   Being older than 70 years and increasing age.   Prolonged use of medicines containing female hormones.   Having diabetes mellitus.   Rapidly losing weight.   Having a family history of gallstones (heredity).  SYMPTOMS  Nausea.   Vomiting.  Abdominal pain.   Yellowing of the skin (jaundice).   Sudden pain. It may persist from several minutes to several hours.  Fever.   Tenderness to the touch. In some cases, when gallstones do not move into the bile duct, people have no pain or symptoms. These are called "silent" gallstones.  TREATMENT Silent gallstones do not need treatment. In severe cases, emergency surgery may be required. Options for treatment include:  Surgery to remove the gallbladder. This is the most common treatment.  Medicines. These do not always work and may take 6 12 months or more to  work.  Shock wave treatment (extracorporeal biliary lithotripsy). In this treatment an ultrasound machine sends shock waves to the gallbladder to break gallstones into smaller pieces that can pass into the intestines or be dissolved by medicine. HOME CARE INSTRUCTIONS   Only take over-the-counter or prescription medicines for pain, discomfort, or fever as directed by your health care provider.   Follow a low-fat diet until seen again by your health care provider. Fat causes the gallbladder to contract, which can result in pain.   Follow up with your health care provider as directed. Attacks are almost always  recurrent and surgery is usually required for permanent treatment.  SEEK IMMEDIATE MEDICAL CARE IF:   Your pain increases and is not controlled by medicines.   You have a fever or persistent symptoms for more than 2 3 days.   You have a fever and your symptoms suddenly get worse.   You have persistent nausea and vomiting.  MAKE SURE YOU:   Understand these instructions.  Will watch your condition.  Will get help right away if you are not doing well or get worse. Document Released: 01/29/2005 Document Revised: 10/05/2012 Document Reviewed: 07/27/2012 [facility] Patient Information 2014 [place].

## 2013-06-27 NOTE — Telephone Encounter (Signed)
Per Ralph Leyden, Dr Pecola Lawless anesthesiologist gave the ok to proceed with surgery. Pt aware. Apolonio Schneiders states she put a note in epic as well.

## 2013-06-27 NOTE — Telephone Encounter (Signed)
Called [facility] pre adm and left msg for nurse to call me back. Awaiting return call.

## 2013-06-27 NOTE — H&P (Signed)
Lisa Simpson   MRN:  [id number]   Description: 38 year old female  Provider: Adin Hector, MD  Department: [facility]           Diagnoses      Cholecystitis with cholelithiasis    -  Primary      574.10            Current Vitals Most recent update: 06/07/2013  3:10 PM by Vale Haven, CMA      BP Pulse Temp(Src) Ht Wt BMI      126/78 73 97.8 F (36.6 C) 5\' 3"  (1.6 m) 236 lb 3.2 oz (107.14 kg) 41.85 kg/m2            History and Physical     Adin Hector, MD    Status: Addendum            Patient ID: Lisa Simpson, female   DOB: May 21, 1975, 38 y.o.   MRN: [id number]              Note:  This dictation was prepared with Dragon/digital dictation along with [facility] technology. Any transcriptional errors that result from this process are unintentional.   HPI Lisa Simpson is a 38 y.o. female.  She is referred by Dr. Lavell Anchors, EDP, for evaluation and management of cholecystitis with cholelithiasis. Dr. Osie Cheeks at [facility]  family practice is her PCP.    She has had attacks of upper abdominal pain, RUQ pain, right flank pain, nausea and vomiting for a year. This has become more frequent now. She says this will hurt her 3 times a week. She continues to vomit. She had her worst attack yesterday and went to the emergency department. She states that she actually knew she had gallstones from an ultrasound at [facility] previously.An ultrasound report dated 11/03/2012 shows multiple gallstones, normal CBD at 0.34 CM, fatty liver, bilateral nonobstructing kidney stones. Ultrasound performed yesterday shows multiple gallstones, largest at 7.5 mm. Gallbladder wall is thickened at 4 mm. Common bile duct normal at 5 mm. Fatty liver. Lab work shows WBC 16,900, hemoglobin 13.8. Lipase is 68. Liver function tests normal. She is feeling better today. She still has some pain. Her mother is with her.   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 Tobacco use. Denies alcohol.          Past Medical History   Diagnosis  Date   .  Seizures     .  Asthma     .  Kidney stones  2014   .  Gall stones  2014         Past Surgical History   Procedure  Laterality  Date   .  Tonsillectomy            History reviewed. No pertinent family history.   Social History History   Substance Use Topics   .  Smoking status:  Current Every Day Smoker -- 0.50 packs/day       Types:  Cigarettes   .  Smokeless tobacco:  Not on file   .  Alcohol Use:  No        No Known Allergies    Current Outpatient Prescriptions   Medication  Sig  Dispense  Refill   .  albuterol (PROVENTIL HFA;VENTOLIN HFA) 108 (90 BASE) MCG/ACT inhaler  Inhale 4 puffs into the lungs every 6 (six) hours as needed. For ease of Breathing         .  cetirizine (ZYRTEC) 10 MG tablet  Take 10 mg by mouth daily.         .  ciprofloxacin (CIPRO) 500 MG tablet  Take 1 tablet (500 mg total) by mouth 2 (two) times daily.   20 tablet   0   .  docusate sodium (COLACE) 100 MG capsule  Take 1 capsule (100 mg total) by mouth every 12 (twelve) hours.   60 capsule   0   .  hydrOXYzine (VISTARIL) 25 MG capsule  Take 25 mg by mouth at bedtime as needed for itching.          .  ondansetron (ZOFRAN ODT) 4 MG disintegrating tablet  Take 1 tablet (4 mg total) by mouth every 8 (eight) hours as needed for nausea.   20 tablet   0   .  phenytoin (DILANTIN) 100 MG ER capsule  Take 100 mg by mouth 3 (three) times daily.                 Review of Systems  Constitutional: Negative for fever, chills and unexpected weight change.  HENT: Negative for congestion, hearing loss, sore throat, trouble swallowing and voice change.   Eyes: Negative for visual disturbance.   Respiratory: Positive for cough. Negative for wheezing.   Cardiovascular: Negative for chest pain, palpitations and leg swelling.  Gastrointestinal: Positive for nausea, vomiting, abdominal pain and diarrhea. Negative for constipation, blood in stool, abdominal distention and anal bleeding.  Genitourinary: Negative for hematuria, vaginal bleeding and difficulty urinating.  Musculoskeletal: Negative for arthralgias.  Skin: Negative for rash and wound.  Neurological: Negative for seizures, syncope and headaches.  Hematological: Negative for adenopathy. Does not bruise/bleed easily.  Psychiatric/Behavioral: Negative for confusion.      Blood pressure 126/78, pulse 73, temperature 97.8 F (36.6 C), height 5\' 3"  (1.6 m), weight 236 lb 3.2 oz (107.14 kg).   Physical Exam   Constitutional: She is oriented to person, place, and time. She appears well-developed and well-nourished. No distress.  BMI 41.84  HENT:   Head: Normocephalic and atraumatic.   Nose: Nose normal.   Mouth/Throat: No oropharyngeal exudate.  Eyes: Conjunctivae and EOM are normal. Pupils are equal, round, and reactive to light. Left eye exhibits no discharge. No scleral icterus.  Neck: Neck supple. No JVD present. No tracheal deviation present. No thyromegaly present.  Cardiovascular: Normal rate, regular rhythm, normal heart sounds and intact distal pulses.    No murmur heard. Pulmonary/Chest: Effort normal and breath sounds normal. No respiratory distress. She has no wheezes. She has no rales. She exhibits no tenderness.  Abdominal: Soft. Bowel sounds are normal. She exhibits no distension and no mass. There is no tenderness. There is no rebound and no guarding.  Tender right upper quadrant. Minimal guarding. Not distended. No mass. No scars. No hernias.  Musculoskeletal: She exhibits no edema and no tenderness.  Lymphadenopathy:    She has no cervical adenopathy.  Neurological: She is alert and oriented to person,  place, and time. She exhibits normal muscle tone. Coordination normal.  Skin: Skin is warm. No rash noted. She is not diaphoretic. No erythema. No pallor.  Psychiatric: She has a normal mood and affect. Her behavior is normal. Judgment and thought content normal.      Data Reviewed  ED physician noted. Labs. Ultrasound   Assessment    Chronic cholecystitis with cholelithiasis, possibly resolving episode of acute cholecystitis. She will need cholecystectomy in the near future in hopes of permitting more significant complications.   Morbid obesity   Tobacco abuse   Seizure disorder   Anxiety and depression   Asthma      Plan    Scheduled for laparoscopic cholecystectomy with cholangiogram, possible open cholecystectomy   I discussed the indications, details, techniques, and numerous risks of the surgery with her. She is aware of the risk of bleeding, infection, conversion to open laparotomy, bile leak, injury to adjacent organs with major reconstructive surgery, cardiac, pulmonary, and thromboembolic problems. I've reviewed patient information booklet with her in detail and given this to her. She understands all these issues. All of her questions are answered. She agrees with this plan.           Edsel Petrin. Dalbert Batman, M.D., [facility] General and Minimally invasive Surgery Breast and Colorectal Surgery Office:   [phone] Pager:   [phone]

## 2013-06-28 ENCOUNTER — Encounter (HOSPITAL_COMMUNITY)
Admission: RE | Disposition: A | Discharge status: Home / Self Care | Payer: Self-pay | Source: Ambulatory Visit | Attending: General Surgery

## 2013-06-28 ENCOUNTER — Encounter (HOSPITAL_COMMUNITY): Payer: Self-pay | Admitting: *Deleted

## 2013-06-28 ENCOUNTER — Telehealth (INDEPENDENT_AMBULATORY_CARE_PROVIDER_SITE_OTHER): Payer: Self-pay | Admitting: General Surgery

## 2013-06-28 ENCOUNTER — Encounter (HOSPITAL_COMMUNITY): Payer: Medicare Other | Admitting: Anesthesiology

## 2013-06-28 ENCOUNTER — Ambulatory Visit (HOSPITAL_COMMUNITY)
Admission: RE | Admit: 2013-06-28 | Discharge: 2013-06-28 | Disposition: A | Discharge status: Home / Self Care | Payer: Medicare Other | Source: Ambulatory Visit | Attending: General Surgery | Admitting: General Surgery

## 2013-06-28 ENCOUNTER — Ambulatory Visit (HOSPITAL_COMMUNITY): Payer: Medicare Other | Admitting: Anesthesiology

## 2013-06-28 ENCOUNTER — Ambulatory Visit (HOSPITAL_COMMUNITY): Payer: Medicare Other

## 2013-06-28 DIAGNOSIS — K8010 Calculus of gallbladder with chronic cholecystitis without obstruction: Secondary | ICD-10-CM | POA: Diagnosis present

## 2013-06-28 DIAGNOSIS — K824 Cholesterolosis of gallbladder: Secondary | ICD-10-CM

## 2013-06-28 DIAGNOSIS — F3289 Other specified depressive episodes: Secondary | ICD-10-CM | POA: Insufficient documentation

## 2013-06-28 DIAGNOSIS — Z9049 Acquired absence of other specified parts of digestive tract: Secondary | ICD-10-CM

## 2013-06-28 DIAGNOSIS — J45909 Unspecified asthma, uncomplicated: Secondary | ICD-10-CM | POA: Insufficient documentation

## 2013-06-28 DIAGNOSIS — F329 Major depressive disorder, single episode, unspecified: Secondary | ICD-10-CM | POA: Insufficient documentation

## 2013-06-28 DIAGNOSIS — K811 Chronic cholecystitis: Secondary | ICD-10-CM

## 2013-06-28 DIAGNOSIS — F411 Generalized anxiety disorder: Secondary | ICD-10-CM | POA: Insufficient documentation

## 2013-06-28 DIAGNOSIS — G40909 Epilepsy, unspecified, not intractable, without status epilepticus: Secondary | ICD-10-CM | POA: Insufficient documentation

## 2013-06-28 DIAGNOSIS — F17200 Nicotine dependence, unspecified, uncomplicated: Secondary | ICD-10-CM | POA: Insufficient documentation

## 2013-06-28 DIAGNOSIS — K219 Gastro-esophageal reflux disease without esophagitis: Secondary | ICD-10-CM | POA: Insufficient documentation

## 2013-06-28 HISTORY — PX: CHOLECYSTECTOMY: SHX55

## 2013-06-28 SURGERY — LAPAROSCOPIC CHOLECYSTECTOMY WITH INTRAOPERATIVE CHOLANGIOGRAM
Anesthesia: General | Site: Abdomen

## 2013-06-28 MED ORDER — ROCURONIUM BROMIDE 100 MG/10ML IV SOLN
INTRAVENOUS | Status: AC
  Filled 2013-06-28: qty 1

## 2013-06-28 MED ORDER — HYDROMORPHONE HCL PF 1 MG/ML IJ SOLN
0.2500 mg | INTRAMUSCULAR | Status: DC | PRN
  Administered 2013-06-28: 0.5 mg via INTRAVENOUS

## 2013-06-28 MED ORDER — NEOSTIGMINE METHYLSULFATE 10 MG/10ML IV SOLN
INTRAVENOUS | Status: AC
  Filled 2013-06-28: qty 1

## 2013-06-28 MED ORDER — LIDOCAINE HCL (CARDIAC) 20 MG/ML IV SOLN
INTRAVENOUS | Status: AC
  Filled 2013-06-28: qty 5

## 2013-06-28 MED ORDER — EPHEDRINE SULFATE 50 MG/ML IJ SOLN
INTRAMUSCULAR | Status: AC
  Filled 2013-06-28: qty 1

## 2013-06-28 MED ORDER — FENTANYL CITRATE 0.05 MG/ML IJ SOLN
INTRAMUSCULAR | Status: DC | PRN
  Administered 2013-06-28: 50 ug via INTRAVENOUS
  Administered 2013-06-28 (×2): 100 ug via INTRAVENOUS

## 2013-06-28 MED ORDER — LACTATED RINGERS IV SOLN
INTRAVENOUS | Status: DC | PRN
  Administered 2013-06-28: 08:00:00 via INTRAVENOUS

## 2013-06-28 MED ORDER — CEFAZOLIN SODIUM-DEXTROSE 2-3 GM-% IV SOLR
2.0000 g | INTRAVENOUS | Status: AC
  Administered 2013-06-28: 2 g via INTRAVENOUS

## 2013-06-28 MED ORDER — 0.9 % SODIUM CHLORIDE (POUR BTL) OPTIME
TOPICAL | Status: DC | PRN
  Administered 2013-06-28: 1000 mL

## 2013-06-28 MED ORDER — PROMETHAZINE HCL 25 MG/ML IJ SOLN
6.2500 mg | INTRAMUSCULAR | Status: DC | PRN
  Administered 2013-06-28: 6.25 mg via INTRAVENOUS

## 2013-06-28 MED ORDER — ACETAMINOPHEN 10 MG/ML IV SOLN
1000.0000 mg | Freq: Once | INTRAVENOUS | Status: AC
  Administered 2013-06-28: 1000 mg via INTRAVENOUS
  Filled 2013-06-28: qty 100

## 2013-06-28 MED ORDER — DEXAMETHASONE SODIUM PHOSPHATE 10 MG/ML IJ SOLN
INTRAMUSCULAR | Status: AC
  Filled 2013-06-28: qty 1

## 2013-06-28 MED ORDER — SODIUM CHLORIDE 0.9 % IJ SOLN
INTRAMUSCULAR | Status: AC
  Filled 2013-06-28: qty 10

## 2013-06-28 MED ORDER — MIDAZOLAM HCL 5 MG/5ML IJ SOLN
INTRAMUSCULAR | Status: DC | PRN
  Administered 2013-06-28: 2 mg via INTRAVENOUS

## 2013-06-28 MED ORDER — MIDAZOLAM HCL 2 MG/2ML IJ SOLN
INTRAMUSCULAR | Status: AC
  Filled 2013-06-28: qty 2

## 2013-06-28 MED ORDER — GLYCOPYRROLATE 0.2 MG/ML IJ SOLN
INTRAMUSCULAR | Status: DC | PRN
  Administered 2013-06-28: 0.6 mg via INTRAVENOUS

## 2013-06-28 MED ORDER — ACETAMINOPHEN 325 MG PO TABS: 650.0000 mg | ORAL_TABLET | ORAL | Status: DC | PRN

## 2013-06-28 MED ORDER — PROPOFOL 10 MG/ML IV BOLUS
INTRAVENOUS | Status: DC | PRN
  Administered 2013-06-28: 200 mg via INTRAVENOUS

## 2013-06-28 MED ORDER — HYDROCODONE-ACETAMINOPHEN 5-325 MG PO TABS: 1.0000 | ORAL_TABLET | Freq: Four times a day (QID) | ORAL | Status: DC | PRN

## 2013-06-28 MED ORDER — HYDROMORPHONE HCL PF 2 MG/ML IJ SOLN
INTRAMUSCULAR | Status: AC
  Filled 2013-06-28: qty 1

## 2013-06-28 MED ORDER — IOHEXOL 300 MG/ML  SOLN
INTRAMUSCULAR | Status: DC | PRN
  Administered 2013-06-28: 12 mL via INTRAVENOUS

## 2013-06-28 MED ORDER — SODIUM CHLORIDE 0.9 % IV SOLN: 250.0000 mL | INTRAVENOUS | Status: DC | PRN

## 2013-06-28 MED ORDER — ONDANSETRON HCL 4 MG/2ML IJ SOLN
INTRAMUSCULAR | Status: DC | PRN
  Administered 2013-06-28: 4 mg via INTRAVENOUS

## 2013-06-28 MED ORDER — LIDOCAINE HCL (CARDIAC) 20 MG/ML IV SOLN
INTRAVENOUS | Status: DC | PRN
  Administered 2013-06-28: 80 mg via INTRAVENOUS

## 2013-06-28 MED ORDER — PROPOFOL 10 MG/ML IV BOLUS
INTRAVENOUS | Status: AC
  Filled 2013-06-28: qty 20

## 2013-06-28 MED ORDER — HYDROMORPHONE HCL PF 1 MG/ML IJ SOLN
INTRAMUSCULAR | Status: AC
  Filled 2013-06-28: qty 1

## 2013-06-28 MED ORDER — BUPIVACAINE-EPINEPHRINE 0.5% -1:200000 IJ SOLN
INTRAMUSCULAR | Status: DC | PRN
Start: 2013-06-28 — End: 2013-06-28
  Administered 2013-06-28: 30 mL

## 2013-06-28 MED ORDER — ONDANSETRON HCL 4 MG/2ML IJ SOLN
INTRAMUSCULAR | Status: AC
  Filled 2013-06-28: qty 2

## 2013-06-28 MED ORDER — PROMETHAZINE HCL 25 MG/ML IJ SOLN
INTRAMUSCULAR | Status: AC
  Filled 2013-06-28: qty 1

## 2013-06-28 MED ORDER — HYDROMORPHONE HCL PF 1 MG/ML IJ SOLN
INTRAMUSCULAR | Status: DC | PRN
  Administered 2013-06-28: 1 mg via INTRAVENOUS

## 2013-06-28 MED ORDER — ROCURONIUM BROMIDE 100 MG/10ML IV SOLN
INTRAVENOUS | Status: DC | PRN
  Administered 2013-06-28: 50 mg via INTRAVENOUS

## 2013-06-28 MED ORDER — DEXAMETHASONE SODIUM PHOSPHATE 10 MG/ML IJ SOLN
INTRAMUSCULAR | Status: DC | PRN
  Administered 2013-06-28: 10 mg via INTRAVENOUS

## 2013-06-28 MED ORDER — OXYCODONE-ACETAMINOPHEN 5-325 MG PO TABS
1.0000 | ORAL_TABLET | Freq: Four times a day (QID) | ORAL | Status: DC | PRN
Start: 2013-06-28 — End: 2013-07-12

## 2013-06-28 MED ORDER — ACETAMINOPHEN 650 MG RE SUPP: 650.0000 mg | RECTAL | Status: DC | PRN

## 2013-06-28 MED ORDER — FENTANYL CITRATE 0.05 MG/ML IJ SOLN
INTRAMUSCULAR | Status: AC
  Filled 2013-06-28: qty 5

## 2013-06-28 MED ORDER — BUPIVACAINE-EPINEPHRINE 0.5% -1:200000 IJ SOLN
INTRAMUSCULAR | Status: AC
  Filled 2013-06-28: qty 1

## 2013-06-28 MED ORDER — NEOSTIGMINE METHYLSULFATE 10 MG/10ML IV SOLN
INTRAVENOUS | Status: DC | PRN
  Administered 2013-06-28: 4 mg via INTRAVENOUS

## 2013-06-28 MED ORDER — GLYCOPYRROLATE 0.2 MG/ML IJ SOLN
INTRAMUSCULAR | Status: AC
Start: 2013-06-28 — End: 2013-06-28
  Filled 2013-06-28: qty 3

## 2013-06-28 MED ORDER — SODIUM CHLORIDE 0.9 % IR SOLN
Status: DC | PRN
  Administered 2013-06-28: 1000 mL

## 2013-06-28 MED ORDER — CEFAZOLIN SODIUM-DEXTROSE 2-3 GM-% IV SOLR
INTRAVENOUS | Status: AC
  Filled 2013-06-28: qty 50

## 2013-06-28 MED ORDER — OXYCODONE HCL 5 MG PO TABS
5.0000 mg | ORAL_TABLET | ORAL | Status: DC | PRN
  Administered 2013-06-28: 5 mg via ORAL
  Filled 2013-06-28: qty 1

## 2013-06-28 SURGICAL SUPPLY — 32 items
APPLIER CLIP ROT 10 11.4 M/L (STAPLE) ×3
BENZOIN TINCTURE PRP APPL 2/3 (GAUZE/BANDAGES/DRESSINGS) IMPLANT
CANISTER SUCTION 2500CC (MISCELLANEOUS) IMPLANT
CLIP APPLIE ROT 10 11.4 M/L (STAPLE) ×1 IMPLANT
CLOSURE WOUND 1/2 X4 (GAUZE/BANDAGES/DRESSINGS)
COVER MAYO STAND STRL (DRAPES) ×3 IMPLANT
DECANTER SPIKE VIAL GLASS SM (MISCELLANEOUS) ×3 IMPLANT
DERMABOND ADVANCED (GAUZE/BANDAGES/DRESSINGS) ×2
DERMABOND ADVANCED .7 DNX12 (GAUZE/BANDAGES/DRESSINGS) ×1 IMPLANT
DRAPE C-ARM 42X120 X-RAY (DRAPES) ×3 IMPLANT
DRAPE LAPAROSCOPIC ABDOMINAL (DRAPES) ×3 IMPLANT
ELECT REM PT RETURN 9FT ADLT (ELECTROSURGICAL) ×3
ELECTRODE REM PT RTRN 9FT ADLT (ELECTROSURGICAL) ×1 IMPLANT
GLOVE EUDERMIC 7 POWDERFREE (GLOVE) ×3 IMPLANT
GOWN STRL REUS W/TWL XL LVL3 (GOWN DISPOSABLE) ×12 IMPLANT
HEMOSTAT SNOW SURGICEL 2X4 (HEMOSTASIS) IMPLANT
KIT BASIN OR (CUSTOM PROCEDURE TRAY) ×3 IMPLANT
POUCH SPECIMEN RETRIEVAL 10MM (ENDOMECHANICALS) ×3 IMPLANT
SCISSORS LAP 5X35 DISP (ENDOMECHANICALS) ×3 IMPLANT
SET CHOLANGIOGRAPH MIX (MISCELLANEOUS) ×3 IMPLANT
SET IRRIG TUBING LAPAROSCOPIC (IRRIGATION / IRRIGATOR) ×3 IMPLANT
SLEEVE XCEL OPT CAN 5 100 (ENDOMECHANICALS) ×3 IMPLANT
SOLUTION ANTI FOG 6CC (MISCELLANEOUS) ×3 IMPLANT
STRIP CLOSURE SKIN 1/2X4 (GAUZE/BANDAGES/DRESSINGS) IMPLANT
SUT MNCRL AB 4-0 PS2 18 (SUTURE) ×3 IMPLANT
TOWEL OR 17X26 10 PK STRL BLUE (TOWEL DISPOSABLE) ×3 IMPLANT
TOWEL OR NON WOVEN STRL DISP B (DISPOSABLE) ×3 IMPLANT
TRAY LAP CHOLE (CUSTOM PROCEDURE TRAY) ×3 IMPLANT
TROCAR BLADELESS OPT 5 100 (ENDOMECHANICALS) ×3 IMPLANT
TROCAR XCEL BLUNT TIP 100MML (ENDOMECHANICALS) ×3 IMPLANT
TROCAR XCEL NON-BLD 11X100MML (ENDOMECHANICALS) ×3 IMPLANT
TUBING INSUFFLATION 10FT LAP (TUBING) ×3 IMPLANT

## 2013-06-28 NOTE — Anesthesia Postprocedure Evaluation (Signed)
  Anesthesia Post-op Note  Patient: Lisa Simpson  Procedure(s) Performed: Procedure(s) (LRB): LAPAROSCOPIC CHOLECYSTECTOMY WITH INTRAOPERATIVE CHOLANGIOGRAM (N/A)  Patient Location: PACU  Anesthesia Type: General  Level of Consciousness: awake and alert   Airway and Oxygen Therapy: Patient Spontanous Breathing  Post-op Pain: mild  Post-op Assessment: Post-op Vital signs reviewed, Patient's Cardiovascular Status Stable, Respiratory Function Stable, Patent Airway and No signs of Nausea or vomiting  Last Vitals:  Filed Vitals:   06/28/13 1200  BP: 112/58  Pulse: 72  Temp: 36.3 C  Resp: 16    Post-op Vital Signs: stable   Complications: No apparent anesthesia complications

## 2013-06-28 NOTE — Transfer of Care (Signed)
Immediate Anesthesia Transfer of Care Note  Patient: Lisa Simpson  Procedure(s) Performed: Procedure(s): LAPAROSCOPIC CHOLECYSTECTOMY WITH INTRAOPERATIVE CHOLANGIOGRAM (N/A)  Patient Location: PACU  Anesthesia Type:General  Level of Consciousness: awake, alert  and oriented  Airway & Oxygen Therapy: Patient Spontanous Breathing and Patient connected to face mask oxygen  Post-op Assessment: Report given to PACU RN and Post -op Vital signs reviewed and stable  Post vital signs: Reviewed and stable  Complications: No apparent anesthesia complications

## 2013-06-28 NOTE — Op Note (Signed)
Patient Name:           Lisa Simpson   Date of Surgery:        06/28/2013  Note: This dictation was prepared with Dragon/digital dictation along with [facility] technology. Any transcriptional errors that result from this process are unintentional.   Pre op Diagnosis:      Chronic cholecystitis with cholelithiasis  Post op Diagnosis:    Same  Procedure:                 Laparoscopic cholecystectomy with cholangiogram.  Surgeon:                     Edsel Petrin. Dalbert Batman, M.D., FACS  Assistant:                      None  Operative Indications:   Lisa Simpson is a 38 y.o. female. She is referred by Dr. Lavell Anchors, EDP, for evaluation and management of cholecystitis with cholelithiasis. She has had attacks of upper abdominal pain, RUQ pain, right flank pain, nausea and vomiting for a year. This has become more frequent now. She says this will hurt her 3 times a week. She continues to vomit. She had her worst attack recently and went to the emergency department. She states that she actually knew she had gallstones from an ultrasound at [facility] previously.An ultrasound report dated 11/03/2012 shows multiple gallstones, normal CBD at 0.34 CM, fatty liver, bilateral nonobstructing kidney stones. Ultrasound performed yesterday shows multiple gallstones, largest at 7.5 mm. Gallbladder wall is thickened at 4 mm. Common bile duct normal at 5 mm. Fatty liver.  Lab work shows WBC 16,900, hemoglobin 13.8. Lipase is 68. Liver function tests normal.  She is brought to the operating room electively..  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 Tobacco use. Denies alcohol.   Operative Findings:       The gallbladder was thickwalled and chronically inflamed, but there were minimal adhesions to the gallbladder. The cystic duct was extremely tiny  but I was able to cannulate it and perform a cholangiogram. The cholangiogram showed normal intrahepatic and extrahepatic biliary anatomy, no dilatation, no filling defects, and no obstruction with good flow of contrast into the duodenum. The radiology technician called me back later and told me that they had not saved the cholangiogram, and so that it could not be retrieved for review.The liver appeared significantly enlarged but was of normal color and texture. This suggested fatty liver. The stomach, duodenum, small intestine, and large intestine were grossly normal to inspection.  Procedure in Detail:      Following the induction of general endotracheal anesthesia the patient's abdomen was prepped and draped in a sterile fashion, intravenous antibiotics were given, and a surgical time out was performed. 0.5% Marcaine with epinephrine was used as local infiltration anesthetic. A vertical incision was made in the lower rim of the umbilicus. The fascia was incised in the midline and  the abdominal cavity entered under direct vision. An 11 mm Hassan trocar was inserted and secured with a pursestring suture of 0 Vicryl. Pneumoperitoneum was created and the camera inserted. 11 mm trocar was placed in the subxiphoid region and two 5 mm trocars placed in the right midabdomen.      The gallbladder fundus was identified, grasped and elevated. The infundibulum  was tracked laterally. The peritoneum was dissected off of the neck of the gallbladder. The very tiny cystic duct and cystic artery were isolated and a large window was created behind these with a good critical view. Cholangiogram catheter was inserted into the cystic duct. Cholangiogram was performed with the C-arm, and was normal as described above. The cholangiogram catheter was removed, the cystic duct and cystic artery secured with multiple endoclips and divided. The gallbladder was dissected from its bed with electrocautery, placed in a specimen bag and  removed. Hemostasis was excellent and achieved with electrocautery. The sub-hepatic and subphrenic spaces were irrigated and irrigation fluid was clear. There is no evidence of bleeding or bile leak. The trocars were removed and the pneumoperitoneum released. The fascia at the umbilicus was closed with 0 Vicryl sutures. The skin incision closed with subcuticular sutures of 4-0 Monocryl and Dermabond. The patient tolerated the procedure well was taken to PACU in stable condition. EBL 10 cc. Counts correct. Complications none.       Edsel Petrin. Dalbert Batman, M.D., FACS General and Minimally Invasive Surgery Breast and Colorectal Surgery  06/28/2013 9:52 AM

## 2013-06-28 NOTE — Telephone Encounter (Signed)
Patient called back and message was delivered below.

## 2013-06-28 NOTE — Discharge Instructions (Signed)
[facility] ______[facility] °LAPAROSCOPIC SURGERY: POST OP INSTRUCTIONS °Always review your discharge instruction sheet given to you by the facility where your surgery was performed. °IF YOU HAVE DISABILITY OR FAMILY LEAVE FORMS, YOU MUST BRING THEM TO THE OFFICE FOR PROCESSING.   °DO NOT GIVE THEM TO YOUR DOCTOR. ° °1. A prescription for pain medication may be given to you upon discharge.  Take your pain medication as prescribed, if needed.  If narcotic pain medicine is not needed, then you may take acetaminophen (Tylenol) or ibuprofen (Advil) as needed. °2. Take your usually prescribed medications unless otherwise directed. °3. If you need a refill on your pain medication, please contact your pharmacy.  They will contact our office to request authorization. Prescriptions will not be filled after 5pm or on week-ends. °4. You should follow a light diet the first few days after arrival home, such as soup and crackers, etc.  Be sure to include lots of fluids daily. °5. Most patients will experience some swelling and bruising in the area of the incisions.  Ice packs will help.  Swelling and bruising can take several days to resolve.  °6. It is common to experience some constipation if taking pain medication after surgery.  Increasing fluid intake and taking a stool softener (such as Colace) will usually help or prevent this problem from occurring.  A mild laxative (Milk of Magnesia or Miralax) should be taken according to package instructions if there are no bowel movements after 48 hours. °7. Unless discharge instructions indicate otherwise, you may remove your bandages 24-48 hours after surgery, and you may shower at that time.  You may have steri-strips (small skin tapes) in place directly over the incision.  These strips should be left on the skin for 7-10 days.  If your surgeon used skin glue on the incision, you may shower in 24 hours.  The glue will flake off over the next 2-3 weeks.  Any sutures or  staples will be removed at the office during your follow-up visit. °8. ACTIVITIES:  You may resume regular (light) daily activities beginning the next day--such as daily self-care, walking, climbing stairs--gradually increasing activities as tolerated.  You may have sexual intercourse when it is comfortable.  Refrain from any heavy lifting or straining until approved by your doctor. °a. You may drive when you are no longer taking prescription pain medication, you can comfortably wear a seatbelt, and you can safely maneuver your car and apply brakes. °b. RETURN TO WORK:  __________________________________________________________ °9. You should see your doctor in the office for a follow-up appointment approximately 2-3 weeks after your surgery.  Make sure that you call for this appointment within a day or two after you arrive home to insure a convenient appointment time. °10. OTHER INSTRUCTIONS: __________________________________________________________________________________________________________________________ __________________________________________________________________________________________________________________________ °WHEN TO CALL YOUR DOCTOR: °1. Fever over 101.0 °2. Inability to urinate °3. Continued bleeding from incision. °4. Increased pain, redness, or drainage from the incision. °5. Increasing abdominal pain ° °The clinic staff is available to answer your questions during regular business hours.  Please don’t hesitate to call and ask to speak to one of the nurses for clinical concerns.  If you have a medical emergency, go to the nearest emergency room or call 911.  A surgeon from [facility] is always on call at the hospital. °[address], [address], [place], [place]  [address] ? [address] [address], [place], [place]   [address] °[phone] ? [phone] ? FAX [phone] °Web site:   [email or web address] °

## 2013-06-28 NOTE — Anesthesia Preprocedure Evaluation (Addendum)
Anesthesia Evaluation  Patient identified by MRN, date of birth, ID band Patient awake    Reviewed: Allergy & Precautions, H&P , NPO status , Patient's Chart, lab work & pertinent test results  History of Anesthesia Complications (+) Family history of anesthesia reaction  Airway Mallampati: II TM Distance: >3 FB Neck ROM: Full    Dental  (+) Edentulous Upper   Pulmonary asthma , pneumonia -, resolved, Current Smoker,  breath sounds clear to auscultation  Pulmonary exam normal       Cardiovascular negative cardio ROS  + Valvular Problems/Murmurs Rhythm:Regular Rate:Normal     Neuro/Psych  Headaches, Seizures -,  PSYCHIATRIC DISORDERS Depression  Neuromuscular disease    GI/Hepatic Neg liver ROS, GERD-  Medicated,  Endo/Other  Morbid obesity  Renal/GU negative Renal ROS  negative genitourinary   Musculoskeletal negative musculoskeletal ROS (+)   Abdominal (+) + obese,   Peds negative pediatric ROS (+)  Hematology negative hematology ROS (+)   Anesthesia Other Findings   Reproductive/Obstetrics negative OB ROS                         Anesthesia Physical Anesthesia Plan  ASA: III  Anesthesia Plan: General   Post-op Pain Management:    Induction: Intravenous  Airway Management Planned: Oral ETT  Additional Equipment:   Intra-op Plan:   Post-operative Plan: Extubation in OR  Informed Consent: I have reviewed the patients History and Physical, chart, labs and discussed the procedure including the risks, benefits and alternatives for the proposed anesthesia with the patient or authorized representative who has indicated his/her understanding and acceptance.   Dental advisory given  Plan Discussed with: CRNA  Anesthesia Plan Comments:         Anesthesia Quick Evaluation

## 2013-06-28 NOTE — Progress Notes (Signed)
Patient stated Vicodin gives her severe itching and whelps. Called Dr. Darrel Hoover office and spoke to [place]. She will inform Dr. Dalbert Batman. Family informed to take this Vicodin script to MD office this afternoon to exchange for different med. Patient has voided and VSS. Home with family.

## 2013-06-28 NOTE — ED Provider Notes (Signed)
Medical screening examination/treatment/procedure(s) were performed by non-physician practitioner and as supervising physician I was immediately available for consultation/collaboration.  Richarda Blade, MD 06/28/13 [phone]

## 2013-06-28 NOTE — Interval H&P Note (Signed)
History and Physical Interval Note:  06/28/2013 7:48 AM  Lisa Simpson  has presented today for surgery, with the diagnosis of gallstones  The goals and the various methods of treatment have been discussed with the patient and family. After consideration of risks, benefits and other options for treatment, the patient has consented to  Procedure(s): LAPAROSCOPIC CHOLECYSTECTOMY WITH INTRAOPERATIVE CHOLANGIOGRAM (N/A) as a surgical intervention .  The patient's history has been reviewed, patient examined today, no change in status, stable for surgery.  I have reviewed the patient's chart and labs.  Questions were answered to the patient's satisfaction.     Adin Hector

## 2013-06-28 NOTE — Telephone Encounter (Signed)
Lisa Simpson from [facility] short stay just called to let us know that Dr. Dalbert Batman just wrote a Rx for Vicodin for this patient to be discharged with but the patient is allergic to Vicodin.  It causes severe itching and a rash.  She informed us that she tolerates percocet well and she has had it in the past.  They wanted to know if Dr. Dalbert Batman would write for a new Rx and the patient's family can pick it up from our office this afternoon.  Informed Lisa Simpson I would send this message to Dr. Dalbert Batman and that we would get in touch with the patient to let her know.

## 2013-06-28 NOTE — Telephone Encounter (Signed)
LMOM asking pt to return my call. This is so that I may inform her that we were able to switch her Rx and that the new one is available for pickup at our front desk.

## 2013-06-28 NOTE — Addendum Note (Signed)
Addended byFlossie Buffy on: 06/28/2013 01:21 PM   Modules accepted: Orders

## 2013-06-29 ENCOUNTER — Telehealth (INDEPENDENT_AMBULATORY_CARE_PROVIDER_SITE_OTHER): Payer: Self-pay

## 2013-06-29 NOTE — Telephone Encounter (Signed)
Pt calling in this a.m. B/c she is having so much pain that she has not been to sleep yet today from having a lap chole yesterday. The pt states that she has been taking Oxycodone 5/325mg  1 tab every 6hr but it's not touching the pain. The pt was wanting to see if she could switch her pain medicine again to Tylox. I advised pt that I was going to ask Dr Dalbert Batman if we could switch her instructions from 1 tab every 6h to 2 tabs every 4hrs to see if this would help control the pain. I asked the pt what her pain was on a pain scale of 1-10 and she said a 9. The pt is eating a little bit but definitely drinking plenty of liquids. The pt is home with her mother so she is not by her self. I placed her on hold so I could go speak with Dr Dalbert Batman. Per Dr Dalbert Batman the pt can take her Oxycodone 5/325mg  2 tabs every 4hrs now. The pt is already requesting a refill on the pain medicine so she doesn't run out this weekend. I advised pt lets try the new order now and if she needs more medicine then she would need to call us tomorrow. I advised the pt that Dr Dalbert Batman wants her up moving around, drinking liquids, and eating if the pain gets worse she might need to go to the ER to r/o a bile leak. The pt understands.

## 2013-06-29 NOTE — Telephone Encounter (Signed)
Pt s/p lap chole on 06/28/13. Pt c/o fever of 99.9 axillary.  Pt noticed a small  Yellowish drainage at incision site but feels that this is the pre-op cleaning.  Pt denies any chills. Advised pt that she needs to be drinking plenty of fluids, take tylenol and then retake her temp orally. Advised pt to call us in the am to let us know how her temp has been overnight. Advised pt to call us back before if she has a fever greater than 101.1.

## 2013-06-30 ENCOUNTER — Ambulatory Visit (INDEPENDENT_AMBULATORY_CARE_PROVIDER_SITE_OTHER): Payer: Medicare Other | Admitting: Surgery

## 2013-06-30 ENCOUNTER — Encounter (HOSPITAL_COMMUNITY): Payer: Self-pay | Admitting: General Surgery

## 2013-06-30 ENCOUNTER — Telehealth (INDEPENDENT_AMBULATORY_CARE_PROVIDER_SITE_OTHER): Payer: Self-pay

## 2013-06-30 VITALS — BP 110/65 | HR 74 | Temp 98.1°F | Resp 16 | Ht 62.0 in | Wt 234.2 lb

## 2013-06-30 DIAGNOSIS — K8010 Calculus of gallbladder with chronic cholecystitis without obstruction: Secondary | ICD-10-CM

## 2013-06-30 NOTE — Telephone Encounter (Signed)
Pt s/p lap chole. Pt states that she had fevers of 101.2 last night, after taking tylenol her fever decreased to 98.9. Pt states her incision is red, swollen and draining yellowish/greenish pus. Advised pt to continue to drink plenty of fluids and to take tylenol every 6 hours as needed. Made pt a urgent office appt for this afternoon. Pt also would like to get a refill on her pain meds, because Dr Dalbert Batman had her to double up on the  [place].

## 2013-06-30 NOTE — Progress Notes (Signed)
Maceo, MD,  [address]  [place], [place]    [place] Phone:  [phone] FAX:  [phone]   Re:   Lisa Simpson DOB:   10-14-1975 MRN:   [id number]  Urgent Office  ASSESSMENT AND PLAN: 1.  S/P lap chole - 06/28/2013 - H. Dalbert Batman  I can find nothing wrong with her gall bladder surgery.    Retrospectively, she may have been better being kept overnight - in that she has a lot of issues. I expect her fever is probably from atelectasis - when adding up her obesity, smoking history, abdominal pain.  She is on Cipro for a UTI (though that diagnosis was made on a UA)  I expect that she will get better, though I expect it to be slow.  And I expect for her to ask for more pain meds in time.  She will see Dr. Dalbert Batman - 07/13/2013.  She can call earlier if she has any other concerns.  I wrote her for oxycodone/acetaminophen (5/325) - #30.    2.  Constipation  Has seen Dr. Collene Mares for this.  Just started Linzess 2 weeks before surgery.  Prior to the medicine, she said that she would go 2+ weeks without a BM.  I talked about drinking a lot of water/liquids. 3.  Smokes 4.  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  History of seizures 6.  Being treated for a UTI with Cipro  I think that this is a soft diagnosis. 7.  Anxiety and depression 8.  Chronic constipation   Saw Dr. Collene Mares who put her on Linzess about 2 weeks ago 9.  Went to ER the night before surgery with heart palpitations  HISTORY OF PRESENT ILLNESS: Chief Complaint  Patient presents with  . Wound Check    Lisa Simpson is a 38 y.o. (DOB: 1976/01/24)  white  female who is a patient of Lorelee Market, MD (Note: Per Dr Brunetta Genera office, pt is no longer being followed by their office. She no showed/cancelled all her appts over the last year) and comes to Urgent Office today for post op cholecystectomy concerns. Accompanied by Lajean Saver,  Niece.  She is complaining of peri umbilical pain. She had a fever last PM of 101.2.  She has had no nausea or vomiting.  She has not had a BM in several days.  Though up until 2 weeks ago, she would go over 2 weeks without a BM.  She saw Dr. Collene Mares, who put her on Linzess, which gave her daily BM's. She has a little bit of a rambling history.  But she does hurt and is bent over.  Past Medical History  Diagnosis Date  . Asthma   . Gall stones 2014  . History of kidney stones     2 currently  . Seizures 2013  . Heart palpitations     "feels fluttery"  . Heart murmur   . Borderline hypercholesterolemia   . Pneumonia oct 2014    hx of  . GERD (gastroesophageal reflux disease)   . Headache(784.0)     occasional  . Family history of anesthesia complication     "dad woke up during surgery"    SOCIAL HISTORY: Married.  Husband is a Development worker, community. She has 3 children - 14, 10, 6. She is on disability. Niece is with her today.  PHYSICAL EXAM: BP 110/65  Pulse 74  Temp(Src) 98.1  F (36.7 C)  Resp 16  Ht 5\' 2"  (1.575 m)  Wt 234 lb 3.2 oz (106.232 kg)  BMI 42.82 kg/m2  LMP 06/18/2013  General: Obese WF who is alert.Marland Kitchen  HEENT: Normal. Pupils equal.  Lungs: Clear to auscultation and symmetric breath sounds.   Heart:  RRR. No murmur or rub. Abdomen: Obese. Soft. Incisions look okay.  She has bruising around umbilical incision, but no evidence of cellulitis or infection.  BS present.  DATA REVIEWED: Epic notes  Alphonsa Overall, MD,  [facility], [address]  [place], [place]    [place] Phone:  [phone] FAX:  [phone]

## 2013-07-07 ENCOUNTER — Ambulatory Visit (INDEPENDENT_AMBULATORY_CARE_PROVIDER_SITE_OTHER): Payer: Medicare Other | Admitting: General Surgery

## 2013-07-12 ENCOUNTER — Encounter (HOSPITAL_COMMUNITY): Payer: Self-pay | Admitting: Emergency Medicine

## 2013-07-12 ENCOUNTER — Emergency Department (HOSPITAL_COMMUNITY)
Admission: EM | Admit: 2013-07-12 | Discharge: 2013-07-13 | Disposition: A | Discharge status: Home / Self Care | Payer: Medicare Other | Attending: Emergency Medicine | Admitting: Emergency Medicine

## 2013-07-12 DIAGNOSIS — K219 Gastro-esophageal reflux disease without esophagitis: Secondary | ICD-10-CM | POA: Insufficient documentation

## 2013-07-12 DIAGNOSIS — Z79899 Other long term (current) drug therapy: Secondary | ICD-10-CM | POA: Insufficient documentation

## 2013-07-12 DIAGNOSIS — J45909 Unspecified asthma, uncomplicated: Secondary | ICD-10-CM | POA: Insufficient documentation

## 2013-07-12 DIAGNOSIS — Z3202 Encounter for pregnancy test, result negative: Secondary | ICD-10-CM | POA: Insufficient documentation

## 2013-07-12 DIAGNOSIS — F17200 Nicotine dependence, unspecified, uncomplicated: Secondary | ICD-10-CM | POA: Insufficient documentation

## 2013-07-12 DIAGNOSIS — Z8701 Personal history of pneumonia (recurrent): Secondary | ICD-10-CM | POA: Insufficient documentation

## 2013-07-12 DIAGNOSIS — G40909 Epilepsy, unspecified, not intractable, without status epilepticus: Secondary | ICD-10-CM | POA: Insufficient documentation

## 2013-07-12 DIAGNOSIS — G8918 Other acute postprocedural pain: Secondary | ICD-10-CM | POA: Insufficient documentation

## 2013-07-12 DIAGNOSIS — R011 Cardiac murmur, unspecified: Secondary | ICD-10-CM | POA: Insufficient documentation

## 2013-07-12 DIAGNOSIS — R079 Chest pain, unspecified: Secondary | ICD-10-CM | POA: Insufficient documentation

## 2013-07-12 DIAGNOSIS — Z9089 Acquired absence of other organs: Secondary | ICD-10-CM | POA: Insufficient documentation

## 2013-07-12 DIAGNOSIS — Z87442 Personal history of urinary calculi: Secondary | ICD-10-CM | POA: Insufficient documentation

## 2013-07-12 LAB — CBC WITH DIFFERENTIAL/PLATELET
BASOS ABS: 0 10*3/uL (ref 0.0–0.1)
Basophils Relative: 0 % (ref 0–1)
EOS PCT: 2 % (ref 0–5)
Eosinophils Absolute: 0.3 10*3/uL (ref 0.0–0.7)
HCT: 41.5 % (ref 36.0–46.0)
Hemoglobin: 13.6 g/dL (ref 12.0–15.0)
Lymphocytes Relative: 25 % (ref 12–46)
Lymphs Abs: 3.3 10*3/uL (ref 0.7–4.0)
MCH: 27.6 pg (ref 26.0–34.0)
MCHC: 32.8 g/dL (ref 30.0–36.0)
MCV: 84.3 fL (ref 78.0–100.0)
MONO ABS: 0.4 10*3/uL (ref 0.1–1.0)
Monocytes Relative: 3 % (ref 3–12)
Neutro Abs: 9.1 10*3/uL — ABNORMAL HIGH (ref 1.7–7.7)
Neutrophils Relative %: 70 % (ref 43–77)
PLATELETS: 344 10*3/uL (ref 150–400)
RBC: 4.92 MIL/uL (ref 3.87–5.11)
RDW: 14.8 % (ref 11.5–15.5)
WBC: 13.1 10*3/uL — ABNORMAL HIGH (ref 4.0–10.5)

## 2013-07-12 LAB — COMPREHENSIVE METABOLIC PANEL
ALT: 14 U/L (ref 0–35)
AST: 14 U/L (ref 0–37)
Albumin: 3.5 g/dL (ref 3.5–5.2)
Alkaline Phosphatase: 93 U/L (ref 39–117)
BUN: 7 mg/dL (ref 6–23)
CO2: 24 mEq/L (ref 19–32)
CREATININE: 0.75 mg/dL (ref 0.50–1.10)
Calcium: 9.7 mg/dL (ref 8.4–10.5)
Chloride: 102 mEq/L (ref 96–112)
GFR calc Af Amer: >90 mL/min (ref >90)
GFR calc non Af Amer: >90 mL/min (ref >90)
Glucose, Bld: 112 mg/dL — ABNORMAL HIGH (ref 70–99)
Potassium: 4.2 mEq/L (ref 3.7–5.3)
SODIUM: 140 meq/L (ref 137–147)
TOTAL PROTEIN: 7.7 g/dL (ref 6.0–8.3)
Total Bilirubin: 0.2 mg/dL — ABNORMAL LOW (ref 0.3–1.2)

## 2013-07-12 NOTE — ED Notes (Addendum)
Pt c/o redness and infection at surgical site along with pain in between right rib cage and back. Pt had gallbladder removed 2 weeks ago, was done at [facility] done by [facility] [place] [facility]. Pt reports some drainage from top site and pain at lower site. Pt reports increase use in inhaler the past couple days. sts other than that she has been active and back to her normal self. Denies n/v/d. Nad, skin warm and dry, resp e/u.

## 2013-07-12 NOTE — ED Provider Notes (Addendum)
CSN: [id number]     Arrival date & time 07/12/13  1845 History   First MD Initiated Contact with Patient 07/12/13 2337     Chief Complaint  Patient presents with  . Post-op Problem     (Consider location/radiation/quality/duration/timing/severity/associated sxs/prior Treatment) HPI This is a 38 year old female who is 2 weeks status post cholecystectomy for acute on chronic cholecystitis. She has had persistent anorexia and nausea since the surgery but her ability to eat has been improving. She is here this evening with a two-day history of pain in her right lower posterior ribs, worse with breathing or movement. She is also had a one-day history of pain at her laparoscopy sites. She states that she has noticed purulent drainage from these particularly when she takes a shower. She states the pain is moderate but tolerable. She states she had a fever of 101.2 yesterday. She also has some right upper quadrant pain that has persisted since surgery.  Past Medical History  Diagnosis Date  . Asthma   . Gall stones 2014  . History of kidney stones     2 currently  . Seizures 2013  . Heart palpitations     "feels fluttery"  . Heart murmur   . Borderline hypercholesterolemia   . Pneumonia oct 2014    hx of  . GERD (gastroesophageal reflux disease)   . Headache(784.0)     occasional  . Family history of anesthesia complication     "dad woke up during surgery"   Past Surgical History  Procedure Laterality Date  . Tonsillectomy      as child  . Multiple tooth extractions    . Cholecystectomy N/A 06/28/2013    Procedure: LAPAROSCOPIC CHOLECYSTECTOMY WITH INTRAOPERATIVE CHOLANGIOGRAM;  Surgeon: Adin Hector, MD;  Location: [facility];  Service: General;  Laterality: N/A;   No family history on file. History  Substance Use Topics  . Smoking status: Current Every Day Smoker -- 0.50 packs/day for 12 years    Types: Cigarettes  . Smokeless tobacco: Never Used  . Alcohol Use: No   OB History    Grav Para Term Preterm Abortions TAB SAB Ect Mult Living   3 3        3      Review of Systems  All other systems reviewed and are negative.   Allergies  Morphine and related and Vicodin  Home Medications   Prior to Admission medications   Medication Sig Start Date End Date Taking? Authorizing Provider  albuterol (PROVENTIL HFA;VENTOLIN HFA) 108 (90 BASE) MCG/ACT inhaler Inhale 4 puffs into the lungs every 6 (six) hours as needed. For ease of Breathing   Yes Historical Provider, MD  hydrOXYzine (VISTARIL) 25 MG capsule Take 25 mg by mouth at bedtime as needed for itching.    Yes Historical Provider, MD  Linaclotide (LINZESS) 290 MCG CAPS capsule Take 290 mcg by mouth daily.   Yes Historical Provider, MD  Naproxen Sodium (ALEVE) 220 MG CAPS Take 440 mg by mouth 2 (two) times daily as needed (Pain).   Yes Historical Provider, MD  ondansetron (ZOFRAN) 4 MG tablet Take 1 tablet (4 mg total) by mouth every 6 (six) hours. 06/27/13  Yes Marissa Sciacca, PA-C  phenytoin (DILANTIN) 100 MG ER capsule Take 100 mg by mouth 2 (two) times daily.    Yes Historical Provider, MD  ranitidine (ZANTAC) 150 MG capsule Take 150 mg by mouth 2 (two) times daily as needed for heartburn.   Yes Historical Provider, MD  traMADol (ULTRAM) 50 MG tablet Take 50 mg by mouth every 6 (six) hours as needed (pain).   Yes Historical Provider, MD   BP 124/85  Pulse 90  Temp(Src) 98.8 F (37.1 C) (Oral)  Resp 20  Ht 5\' 1"  (1.549 m)  Wt 221 lb (100.245 kg)  BMI 41.78 kg/m2  SpO2 99%  LMP 06/18/2013  Physical Exam General: Well-developed, well-nourished female in no acute distress; appearance consistent with age of record HENT: normocephalic; atraumatic Eyes: pupils equal, round and reactive to light; extraocular muscles intact Neck: supple Heart: regular rate and rhythm; no murmurs, rubs or gallops Lungs: clear to auscultation bilaterally Chest: Right lower posterior rib tenderness without deformity or  crepitus Abdomen: soft; nondistended; mild to moderate right upper quadrant tenderness; no masses or hepatosplenomegaly; bowel sounds present Extremities: No deformity; full range of motion; pulses normal; no edema Neurologic: Awake, alert and oriented; motor function intact in all extremities and symmetric; no facial droop Skin: Warm and dry; well healing laparoscopy incisions without purulent drainage or fluctuance noted but with tenderness:   Psychiatric: Normal mood and affect    ED Course  Procedures (including critical care time)   MDM   Nursing notes and vitals signs, including pulse oximetry, reviewed.  Summary of this visit's results, reviewed by myself:  Labs:  Results for orders placed during the hospital encounter of 07/12/13 (from the past 24 hour(s))  CBC WITH DIFFERENTIAL     Status: Abnormal   Collection Time    07/12/13  6:59 PM      Result Value Ref Range   WBC 13.1 (*) 4.0 - 10.5 K/uL   RBC 4.92  3.87 - 5.11 MIL/uL   Hemoglobin 13.6  12.0 - 15.0 g/dL   HCT 41.5  36.0 - 46.0 %   MCV 84.3  78.0 - 100.0 fL   MCH 27.6  26.0 - 34.0 pg   MCHC 32.8  30.0 - 36.0 g/dL   RDW 14.8  11.5 - 15.5 %   Platelets 344  150 - 400 K/uL   Neutrophils Relative % 70  43 - 77 %   Neutro Abs 9.1 (*) 1.7 - 7.7 K/uL   Lymphocytes Relative 25  12 - 46 %   Lymphs Abs 3.3  0.7 - 4.0 K/uL   Monocytes Relative 3  3 - 12 %   Monocytes Absolute 0.4  0.1 - 1.0 K/uL   Eosinophils Relative 2  0 - 5 %   Eosinophils Absolute 0.3  0.0 - 0.7 K/uL   Basophils Relative 0  0 - 1 %   Basophils Absolute 0.0  0.0 - 0.1 K/uL  COMPREHENSIVE METABOLIC PANEL     Status: Abnormal   Collection Time    07/12/13  6:59 PM      Result Value Ref Range   Sodium 140  137 - 147 mEq/L   Potassium 4.2  3.7 - 5.3 mEq/L   Chloride 102  96 - 112 mEq/L   CO2 24  19 - 32 mEq/L   Glucose, Bld 112 (*) 70 - 99 mg/dL   BUN 7  6 - 23 mg/dL   Creatinine, Ser 0.75  0.50 - 1.10 mg/dL   Calcium 9.7  8.4 - 10.5 mg/dL    Total Protein 7.7  6.0 - 8.3 g/dL   Albumin 3.5  3.5 - 5.2 g/dL   AST 14  0 - 37 U/L   ALT 14  0 - 35 U/L   Alkaline Phosphatase 93  39 - 117 U/L   Total Bilirubin 0.2 (*) 0.3 - 1.2 mg/dL   GFR calc non Af Amer >90  >90 mL/min   GFR calc Af Amer >90  >90 mL/min  URINALYSIS, ROUTINE W REFLEX MICROSCOPIC     Status: Abnormal   Collection Time    07/13/13 12:20 AM      Result Value Ref Range   Color, Urine YELLOW  YELLOW   APPearance CLOUDY (*) CLEAR   Specific Gravity, Urine 1.015  1.005 - 1.030   pH 5.0  5.0 - 8.0   Glucose, UA NEGATIVE  NEGATIVE mg/dL   Hgb urine dipstick NEGATIVE  NEGATIVE   Bilirubin Urine NEGATIVE  NEGATIVE   Ketones, ur NEGATIVE  NEGATIVE mg/dL   Protein, ur NEGATIVE  NEGATIVE mg/dL   Urobilinogen, UA 0.2  0.0 - 1.0 mg/dL   Nitrite NEGATIVE  NEGATIVE   Leukocytes, UA MODERATE (*) NEGATIVE  PREGNANCY, URINE     Status: None   Collection Time    07/13/13 12:20 AM      Result Value Ref Range   Preg Test, Ur NEGATIVE  NEGATIVE  URINE MICROSCOPIC-ADD ON     Status: Abnormal   Collection Time    07/13/13 12:20 AM      Result Value Ref Range   Squamous Epithelial / LPF FEW (*) RARE   WBC, UA 3-6  <3 WBC/hpf   Bacteria, UA MANY (*) RARE    Imaging Studies: Dg Ribs Unilateral W/chest Right  07/13/2013   CLINICAL DATA:  RIGHT lower rib pain, recent gallbladder surgery  EXAM: RIGHT RIBS AND CHEST - 3+ VIEW  COMPARISON:  06/27/2013  FINDINGS: Upper normal heart size.  Normal mediastinal contours and pulmonary vascularity.  Minimal chronic peribronchial thickening.  No acute infiltrate, pleural effusion or pneumothorax.  Osseous mineralization normal.  BB placed at site of symptoms lower RIGHT chest.  No rib fracture or bone destruction.  Surgical clips RIGHT upper quadrant consistent with history of cholecystectomy.  IMPRESSION: No acute abnormalities.   Electronically Signed   By: Lavonia Dana M.D.   On: 07/13/2013 01:02   1:30 AM Although the patient's wounds  appear to be healing well without gross evidence of infection based on history of a fever and increased tenderness at the sites we will treat her with an antibiotic. She has a followup appointment scheduled with her surgeon for next week.      Wynetta Fines, MD 07/13/13 0131  Wynetta Fines, MD 07/13/13 7510

## 2013-07-13 ENCOUNTER — Encounter (INDEPENDENT_AMBULATORY_CARE_PROVIDER_SITE_OTHER): Payer: Medicare Other | Admitting: General Surgery

## 2013-07-13 ENCOUNTER — Emergency Department (HOSPITAL_COMMUNITY): Payer: Medicare Other

## 2013-07-13 LAB — URINALYSIS, ROUTINE W REFLEX MICROSCOPIC
BILIRUBIN URINE: NEGATIVE
Glucose, UA: NEGATIVE mg/dL
Hgb urine dipstick: NEGATIVE
Ketones, ur: NEGATIVE mg/dL
NITRITE: NEGATIVE
PH: 5 (ref 5.0–8.0)
Protein, ur: NEGATIVE mg/dL
Specific Gravity, Urine: 1.015 (ref 1.005–1.030)
Urobilinogen, UA: 0.2 mg/dL (ref 0.0–1.0)

## 2013-07-13 LAB — URINE MICROSCOPIC-ADD ON

## 2013-07-13 LAB — PREGNANCY, URINE: PREG TEST UR: NEGATIVE

## 2013-07-13 MED ORDER — DOXYCYCLINE HYCLATE 100 MG IV SOLR
100.0000 mg | Freq: Once | INTRAVENOUS | Status: DC
  Filled 2013-07-13: qty 100

## 2013-07-13 MED ORDER — DOXYCYCLINE HYCLATE 100 MG PO CAPS: 100.0000 mg | ORAL_CAPSULE | Freq: Two times a day (BID) | ORAL | Status: DC

## 2013-07-13 NOTE — ED Notes (Signed)
Pt here for evaluation of surgical site. Surgical site to R side from gallbladder surgery. Reports some drainage and pain at site. Pt also states she has been using her inhaler more frequently.

## 2013-07-14 LAB — URINE CULTURE

## 2013-07-17 ENCOUNTER — Encounter (INDEPENDENT_AMBULATORY_CARE_PROVIDER_SITE_OTHER): Payer: Self-pay | Admitting: General Surgery

## 2013-07-19 ENCOUNTER — Encounter (INDEPENDENT_AMBULATORY_CARE_PROVIDER_SITE_OTHER): Payer: Medicare Other | Admitting: General Surgery

## 2013-07-26 ENCOUNTER — Encounter (INDEPENDENT_AMBULATORY_CARE_PROVIDER_SITE_OTHER): Payer: Self-pay

## 2013-08-15 ENCOUNTER — Encounter (INDEPENDENT_AMBULATORY_CARE_PROVIDER_SITE_OTHER): Payer: Medicare Other | Admitting: General Surgery

## 2013-08-16 ENCOUNTER — Telehealth (INDEPENDENT_AMBULATORY_CARE_PROVIDER_SITE_OTHER): Payer: Self-pay

## 2013-08-16 ENCOUNTER — Encounter (INDEPENDENT_AMBULATORY_CARE_PROVIDER_SITE_OTHER): Payer: Medicare Other | Admitting: General Surgery

## 2013-08-16 ENCOUNTER — Telehealth (INDEPENDENT_AMBULATORY_CARE_PROVIDER_SITE_OTHER): Payer: Self-pay | Admitting: General Surgery

## 2013-08-16 NOTE — Telephone Encounter (Signed)
Opened in error

## 2013-08-16 NOTE — Telephone Encounter (Signed)
Patient can not rescheduled with DR. Dalbert Batman without his approval due No shows and noncompliance

## 2013-09-14 ENCOUNTER — Encounter (INDEPENDENT_AMBULATORY_CARE_PROVIDER_SITE_OTHER): Payer: Medicare Other | Admitting: General Surgery

## 2013-10-30 ENCOUNTER — Other Ambulatory Visit (HOSPITAL_COMMUNITY): Payer: Self-pay | Admitting: Internal Medicine

## 2013-10-30 ENCOUNTER — Ambulatory Visit (HOSPITAL_COMMUNITY)
Admission: RE | Admit: 2013-10-30 | Discharge: 2013-10-30 | Disposition: A | Discharge status: Home / Self Care | Payer: Medicare Other | Source: Ambulatory Visit | Attending: Internal Medicine | Admitting: Internal Medicine

## 2013-10-30 DIAGNOSIS — R0989 Other specified symptoms and signs involving the circulatory and respiratory systems: Secondary | ICD-10-CM | POA: Insufficient documentation

## 2013-10-30 DIAGNOSIS — J45909 Unspecified asthma, uncomplicated: Secondary | ICD-10-CM

## 2013-10-30 DIAGNOSIS — R0609 Other forms of dyspnea: Secondary | ICD-10-CM | POA: Insufficient documentation

## 2013-10-30 DIAGNOSIS — R0789 Other chest pain: Secondary | ICD-10-CM | POA: Diagnosis not present

## 2013-11-17 ENCOUNTER — Other Ambulatory Visit: Payer: Self-pay | Admitting: Internal Medicine

## 2013-11-17 DIAGNOSIS — N63 Unspecified lump in unspecified breast: Secondary | ICD-10-CM

## 2013-11-22 ENCOUNTER — Other Ambulatory Visit: Payer: Self-pay

## 2013-12-18 ENCOUNTER — Encounter (HOSPITAL_COMMUNITY): Payer: Self-pay | Admitting: Emergency Medicine

## 2014-09-11 ENCOUNTER — Emergency Department (HOSPITAL_COMMUNITY)
Admission: EM | Admit: 2014-09-11 | Discharge: 2014-09-12 | Disposition: A | Discharge status: Home / Self Care | Payer: Medicare Other | Attending: Emergency Medicine | Admitting: Emergency Medicine

## 2014-09-11 ENCOUNTER — Encounter (HOSPITAL_COMMUNITY): Payer: Self-pay | Admitting: Emergency Medicine

## 2014-09-11 DIAGNOSIS — R3915 Urgency of urination: Secondary | ICD-10-CM | POA: Insufficient documentation

## 2014-09-11 DIAGNOSIS — Z8701 Personal history of pneumonia (recurrent): Secondary | ICD-10-CM | POA: Diagnosis not present

## 2014-09-11 DIAGNOSIS — K219 Gastro-esophageal reflux disease without esophagitis: Secondary | ICD-10-CM | POA: Insufficient documentation

## 2014-09-11 DIAGNOSIS — G40909 Epilepsy, unspecified, not intractable, without status epilepticus: Secondary | ICD-10-CM | POA: Diagnosis not present

## 2014-09-11 DIAGNOSIS — H6692 Otitis media, unspecified, left ear: Secondary | ICD-10-CM | POA: Insufficient documentation

## 2014-09-11 DIAGNOSIS — H7092 Unspecified mastoiditis, left ear: Secondary | ICD-10-CM | POA: Diagnosis not present

## 2014-09-11 DIAGNOSIS — R300 Dysuria: Secondary | ICD-10-CM | POA: Diagnosis not present

## 2014-09-11 DIAGNOSIS — R350 Frequency of micturition: Secondary | ICD-10-CM | POA: Insufficient documentation

## 2014-09-11 DIAGNOSIS — R34 Anuria and oliguria: Secondary | ICD-10-CM | POA: Diagnosis not present

## 2014-09-11 DIAGNOSIS — H9202 Otalgia, left ear: Secondary | ICD-10-CM | POA: Diagnosis present

## 2014-09-11 DIAGNOSIS — Z79899 Other long term (current) drug therapy: Secondary | ICD-10-CM | POA: Insufficient documentation

## 2014-09-11 DIAGNOSIS — Z720 Tobacco use: Secondary | ICD-10-CM | POA: Insufficient documentation

## 2014-09-11 DIAGNOSIS — Z87442 Personal history of urinary calculi: Secondary | ICD-10-CM | POA: Insufficient documentation

## 2014-09-11 DIAGNOSIS — J45909 Unspecified asthma, uncomplicated: Secondary | ICD-10-CM | POA: Insufficient documentation

## 2014-09-11 HISTORY — DX: Unspecified mastoiditis, left ear: H70.92

## 2014-09-11 LAB — URINALYSIS, ROUTINE W REFLEX MICROSCOPIC
Bilirubin Urine: NEGATIVE
Glucose, UA: NEGATIVE mg/dL
Hgb urine dipstick: NEGATIVE
KETONES UR: NEGATIVE mg/dL
Nitrite: NEGATIVE
PH: 6 (ref 5.0–8.0)
PROTEIN: NEGATIVE mg/dL
Urobilinogen, UA: 0.2 mg/dL (ref 0.0–1.0)

## 2014-09-11 LAB — URINE MICROSCOPIC-ADD ON

## 2014-09-11 NOTE — ED Provider Notes (Signed)
CSN: [id number]     Arrival date & time 09/11/14  2231 History   First MD Initiated Contact with Patient 09/11/14 2258     Chief Complaint  Patient presents with  . Otalgia    Hx of Mastoiditis  . Urinary Tract Infection    Burning with Urination     (Consider location/radiation/quality/duration/timing/severity/associated sxs/prior Treatment) HPI   Lisa Simpson is a 39 y.o. female with hx of recurrent left mastoiditis, who presents to the Emergency Department complaining of persistent left ear pain for 17 days.  She states that she was seen by her primary doctor for a severe ear infection and took 14 days of Clindamycin, completing the course yesterday.  She complains of persistent pain to her ear, left jaw and behind her ear.  Pain feels similar to previous episodes of mastoiditis.  Heat improves the pain.  She also reports frequency and burning with urination and reports voiding small amounts.  She denies fever, facial swelling, dental pain, neck pain, difficulty swallowing, hematuria and abdominal pain.    Past Medical History  Diagnosis Date  . Asthma   . Gall stones 2014  . History of kidney stones     2 currently  . Seizures 2013  . Heart palpitations     "feels fluttery"  . Heart murmur   . Borderline hypercholesterolemia   . Pneumonia oct 2014    hx of  . GERD (gastroesophageal reflux disease)   . Headache(784.0)     occasional  . Family history of anesthesia complication     "dad woke up during surgery"  . Mastoiditis of left side 2015   Past Surgical History  Procedure Laterality Date  . Tonsillectomy      as child  . Multiple tooth extractions    . Cholecystectomy N/A 06/28/2013    Procedure: LAPAROSCOPIC CHOLECYSTECTOMY WITH INTRAOPERATIVE CHOLANGIOGRAM;  Surgeon: Adin Hector, MD;  Location: [facility];  Service: General;  Laterality: N/A;   History reviewed. No pertinent family history. History  Substance Use Topics  . Smoking status: Current Every  Day Smoker -- 0.50 packs/day for 12 years    Types: Cigarettes  . Smokeless tobacco: Never Used  . Alcohol Use: No   OB History    Gravida Para Term Preterm AB TAB SAB Ectopic Multiple Living   3 3        3      Review of Systems  Constitutional: Negative for fever, chills, activity change and appetite change.  HENT: Positive for ear pain. Negative for congestion, dental problem, ear discharge, facial swelling, hearing loss and trouble swallowing.   Gastrointestinal: Negative for nausea, vomiting and abdominal pain.  Genitourinary: Positive for dysuria, urgency, frequency and decreased urine volume. Negative for hematuria and vaginal bleeding.  Musculoskeletal: Negative for back pain and neck pain.  Skin: Negative for rash.  Neurological: Negative for dizziness, syncope, speech difficulty and headaches.  All other systems reviewed and are negative.     Allergies  Morphine and related and Vicodin  Home Medications   Prior to Admission medications   Medication Sig Start Date End Date Taking? Authorizing Provider  albuterol (PROVENTIL HFA;VENTOLIN HFA) 108 (90 BASE) MCG/ACT inhaler Inhale 4 puffs into the lungs every 6 (six) hours as needed. For ease of Breathing    Historical Provider, MD  doxycycline (VIBRAMYCIN) 100 MG capsule Take 1 capsule (100 mg total) by mouth 2 (two) times daily. One po bid x 7 days 07/13/13   Shanon Rosser, MD  hydrOXYzine (VISTARIL) 25 MG capsule Take 25 mg by mouth at bedtime as needed for itching.     Historical Provider, MD  Linaclotide Rolan Lipa) 290 MCG CAPS capsule Take 290 mcg by mouth daily.    Historical Provider, MD  Naproxen Sodium (ALEVE) 220 MG CAPS Take 440 mg by mouth 2 (two) times daily as needed (Pain).    Historical Provider, MD  ondansetron (ZOFRAN) 4 MG tablet Take 1 tablet (4 mg total) by mouth every 6 (six) hours. 06/27/13   Marissa Sciacca, PA-C  phenytoin (DILANTIN) 100 MG ER capsule Take 100 mg by mouth 2 (two) times daily.      Historical Provider, MD  ranitidine (ZANTAC) 150 MG capsule Take 150 mg by mouth 2 (two) times daily as needed for heartburn.    Historical Provider, MD  traMADol (ULTRAM) 50 MG tablet Take 50 mg by mouth every 6 (six) hours as needed (pain).    Historical Provider, MD   BP 130/71 mmHg  Pulse 85  Temp(Src) 98 F (36.7 C) (Oral)  Resp 20  Ht 5\' 2"  (1.575 m)  Wt 186 lb (84.369 kg)  BMI 34.01 kg/m2  SpO2 100%  LMP 08/21/2013 Physical Exam  Constitutional: She is oriented to person, place, and time. She appears well-developed and well-nourished. No distress.  HENT:  Head: Normocephalic and atraumatic.  Right Ear: Tympanic membrane and ear canal normal.  Left Ear: Ear canal normal. There is tenderness. No drainage or swelling. There is mastoid tenderness. Tympanic membrane is erythematous. Tympanic membrane is not bulging. No hemotympanum.  Mouth/Throat: Uvula is midline, oropharynx is clear and moist and mucous membranes are normal.  Neck: Normal range of motion. Neck supple. No thyromegaly present.  Cardiovascular: Normal rate, regular rhythm and normal heart sounds.   Pulmonary/Chest: Effort normal and breath sounds normal. No respiratory distress.  Abdominal: Soft. Normal appearance. She exhibits no distension. There is no tenderness. There is no rebound and no CVA tenderness.  Musculoskeletal: Normal range of motion.  Lymphadenopathy:    She has no cervical adenopathy.  Neurological: She is alert and oriented to person, place, and time. She exhibits normal muscle tone. Coordination normal.  Skin: Skin is warm and dry.  Psychiatric: She has a normal mood and affect.  Nursing note and vitals reviewed.   ED Course  Procedures (including critical care time) Labs Review Labs Reviewed  URINALYSIS, ROUTINE W REFLEX MICROSCOPIC (NOT AT [facility])    Imaging Review No results found.   EKG Interpretation None     Urine culture pending.  MDM   Final diagnoses:  Otitis media  follow-up, not resolved, left   Pt is well appearing, vitals stable.  Afebrile with persistent left OM.  Since pt has hx of recurrent mastoiditis and no clinical sx's of acute mastoiditis at this time, I feel patient can be treated with Amoxil and she agrees to arrange ENT f/u.  Return precautions also given.     Kem Parkinson, PA-C 12/75/17 0017  Delora Fuel, MD 49/44/96 [id number]

## 2014-09-11 NOTE — ED Notes (Addendum)
Pt states that she was seen by PCP and was treated for ear infection, still having pain, has hx of mastoiditis.  Also having pain with urination

## 2014-09-12 MED ORDER — TRAMADOL HCL 50 MG PO TABS: 50.0000 mg | ORAL_TABLET | Freq: Four times a day (QID) | ORAL | Status: DC | PRN

## 2014-09-12 MED ORDER — AMOXICILLIN 500 MG PO CAPS: 1000.0000 mg | ORAL_CAPSULE | Freq: Two times a day (BID) | ORAL | Status: DC

## 2014-09-12 NOTE — Discharge Instructions (Signed)
Otitis Media °Otitis media is redness, soreness, and puffiness (swelling) in the space just behind your eardrum (middle ear). It may be caused by allergies or infection. It often happens along with a cold. °HOME CARE °· Take your medicine as told. Finish it even if you start to feel better. °· Only take over-the-counter or prescription medicines for pain, discomfort, or fever as told by your doctor. °· Follow up with your doctor as told. °GET HELP IF: °· You have otitis media only in one ear, or bleeding from your nose, or both. °· You notice a lump on your neck. °· You are not getting better in 3-5 days. °· You feel worse instead of better. °GET HELP RIGHT AWAY IF:  °· You have pain that is not helped with medicine. °· You have puffiness, redness, or pain around your ear. °· You get a stiff neck. °· You cannot move part of your face (paralysis). °· You notice that the bone behind your ear hurts when you touch it. °MAKE SURE YOU:  °· Understand these instructions. °· Will watch your condition. °· Will get help right away if you are not doing well or get worse. °Document Released: 07/22/2007 Document Revised: 02/07/2013 Document Reviewed: 08/30/2012 °Exit[employer]® Patient Information ©2015 [employer]. This information is not intended to replace advice given to you by your health care provider. Make sure you discuss any questions you have with your health care provider. ° °

## 2014-09-13 LAB — URINE CULTURE: Culture: NO GROWTH

## 2014-10-11 ENCOUNTER — Ambulatory Visit (HOSPITAL_COMMUNITY)
Admit: 2014-10-11 | Discharge: 2014-10-11 | Disposition: A | Discharge status: Home / Self Care | Payer: Medicare Other | Source: Ambulatory Visit | Attending: Emergency Medicine | Admitting: Emergency Medicine

## 2014-10-11 ENCOUNTER — Emergency Department (HOSPITAL_COMMUNITY)
Admission: EM | Admit: 2014-10-11 | Discharge: 2014-10-11 | Disposition: A | Discharge status: Home / Self Care | Payer: Medicare Other | Attending: Emergency Medicine | Admitting: Emergency Medicine

## 2014-10-11 ENCOUNTER — Encounter (HOSPITAL_COMMUNITY): Payer: Self-pay | Admitting: Emergency Medicine

## 2014-10-11 DIAGNOSIS — R011 Cardiac murmur, unspecified: Secondary | ICD-10-CM | POA: Insufficient documentation

## 2014-10-11 DIAGNOSIS — Z79899 Other long term (current) drug therapy: Secondary | ICD-10-CM | POA: Insufficient documentation

## 2014-10-11 DIAGNOSIS — M25562 Pain in left knee: Secondary | ICD-10-CM | POA: Diagnosis not present

## 2014-10-11 DIAGNOSIS — K219 Gastro-esophageal reflux disease without esophagitis: Secondary | ICD-10-CM | POA: Diagnosis not present

## 2014-10-11 DIAGNOSIS — G40909 Epilepsy, unspecified, not intractable, without status epilepticus: Secondary | ICD-10-CM | POA: Diagnosis not present

## 2014-10-11 DIAGNOSIS — J45909 Unspecified asthma, uncomplicated: Secondary | ICD-10-CM | POA: Diagnosis not present

## 2014-10-11 DIAGNOSIS — Z8639 Personal history of other endocrine, nutritional and metabolic disease: Secondary | ICD-10-CM | POA: Insufficient documentation

## 2014-10-11 DIAGNOSIS — Z720 Tobacco use: Secondary | ICD-10-CM | POA: Insufficient documentation

## 2014-10-11 DIAGNOSIS — Z8701 Personal history of pneumonia (recurrent): Secondary | ICD-10-CM | POA: Insufficient documentation

## 2014-10-11 DIAGNOSIS — Z87442 Personal history of urinary calculi: Secondary | ICD-10-CM | POA: Insufficient documentation

## 2014-10-11 MED ORDER — OXYCODONE-ACETAMINOPHEN 5-325 MG PO TABS
1.0000 | ORAL_TABLET | ORAL | Status: DC | PRN
Start: 2014-10-11 — End: 2015-02-26

## 2014-10-11 MED ORDER — ENOXAPARIN SODIUM 100 MG/ML ~~LOC~~ SOLN
100.0000 mg | Freq: Once | SUBCUTANEOUS | Status: AC
  Administered 2014-10-11: 100 mg via SUBCUTANEOUS
  Filled 2014-10-11: qty 1

## 2014-10-11 MED ORDER — ONDANSETRON 4 MG PO TBDP
4.0000 mg | ORAL_TABLET | Freq: Once | ORAL | Status: AC
  Administered 2014-10-11: 4 mg via ORAL
  Filled 2014-10-11: qty 1

## 2014-10-11 MED ORDER — IBUPROFEN 800 MG PO TABS: 800.0000 mg | ORAL_TABLET | Freq: Three times a day (TID) | ORAL | Status: DC | PRN

## 2014-10-11 MED ORDER — OXYCODONE-ACETAMINOPHEN 5-325 MG PO TABS
1.0000 | ORAL_TABLET | Freq: Once | ORAL | Status: AC
  Administered 2014-10-11: 1 via ORAL
  Filled 2014-10-11: qty 1

## 2014-10-11 NOTE — ED Provider Notes (Signed)
39 yo F with a chief complaint of left knee pain. Seen by Dr. Leonides Schanz yesterday returned today for an outpatient ultrasound to rule out DVT. Ultrasound study negative for DVT. Results discussed with patient she'll follow-up with her family doctor. Nsaids for pain.   US Venous Img Lower Unilateral Left  10/11/2014   CLINICAL DATA:  Posterior left knee pain.  EXAM: LEFT LOWER EXTREMITY VENOUS DOPPLER ULTRASOUND  TECHNIQUE: Gray-scale sonography with graded compression, as well as color Doppler and duplex ultrasound were performed to evaluate the lower extremity deep venous systems from the level of the common femoral vein and including the common femoral, femoral, profunda femoral, popliteal and calf veins including the posterior tibial, peroneal and gastrocnemius veins when visible. The superficial great saphenous vein was also interrogated. Spectral Doppler was utilized to evaluate flow at rest and with distal augmentation maneuvers in the common femoral, femoral and popliteal veins.  COMPARISON:  None.  FINDINGS: Contralateral Common Femoral Vein: Respiratory phasicity is normal and symmetric with the symptomatic side. No evidence of thrombus. Normal compressibility.  Common Femoral Vein: No evidence of thrombus. Normal compressibility, respiratory phasicity and response to augmentation.  Saphenofemoral Junction: No evidence of thrombus. Normal compressibility and flow on color Doppler imaging.  Profunda Femoral Vein: No evidence of thrombus. Normal compressibility and flow on color Doppler imaging.  Femoral Vein: No evidence of thrombus. Normal compressibility, respiratory phasicity and response to augmentation.  Popliteal Vein: No evidence of thrombus. Normal compressibility, respiratory phasicity and response to augmentation.  Calf Veins: No evidence of thrombus. Normal compressibility and flow on color Doppler imaging.  Superficial Great Saphenous Vein: No evidence of thrombus. Normal compressibility and flow  on color Doppler imaging.  Venous Reflux:  None.  Other Findings:  None.  IMPRESSION: No evidence of deep venous thrombosis of the left lower extremity.   Electronically Signed   By: Fidela Salisbury M.D.   On: 10/11/2014 14:10     Deno Etienne, DO 10/11/14 1420

## 2014-10-11 NOTE — ED Provider Notes (Signed)
TIME SEEN: 1:20 AM  CHIEF COMPLAINT: Left posterior knee pain  HPI: Pt is a 39 y.o. female with history of asthma, kidney stones, seizures who presents emergency department with a left posterior knee pain that started today as well as swelling. Denies a history of injury. States she did have a cramp in her leg today prior to pain starting. States she called a friend of hers who is a Psychologist, sport and exercise who recommend she come to the emergency department to rule out a DVT. Denies any previous history of PE or DVT. Is obese but is not on exogenous estrogen. No recent prolonged immobilization such as long flight, hospitals aging, fracture, surgery, trauma. No chest pain or shortness of breath.  ROS: See HPI Constitutional: no fever  Eyes: no drainage  ENT: no runny nose   Cardiovascular:  no chest pain  Resp: no SOB  GI: no vomiting GU: no dysuria Integumentary: no rash  Allergy: no hives  Musculoskeletal: Left leg swelling  Neurological: no slurred speech ROS otherwise negative  PAST MEDICAL HISTORY/PAST SURGICAL HISTORY:  Past Medical History  Diagnosis Date  . Asthma   . Gall stones 2014  . History of kidney stones     2 currently  . Seizures 2013  . Heart palpitations     "feels fluttery"  . Heart murmur   . Borderline hypercholesterolemia   . Pneumonia oct 2014    hx of  . GERD (gastroesophageal reflux disease)   . Headache(784.0)     occasional  . Family history of anesthesia complication     "dad woke up during surgery"  . Mastoiditis of left side 2015    MEDICATIONS:  Prior to Admission medications   Medication Sig Start Date End Date Taking? Authorizing Provider  albuterol (PROVENTIL HFA;VENTOLIN HFA) 108 (90 BASE) MCG/ACT inhaler Inhale 4 puffs into the lungs every 6 (six) hours as needed. For ease of Breathing   Yes Historical Provider, MD  hydrOXYzine (VISTARIL) 25 MG capsule Take 25 mg by mouth at bedtime as needed for itching.    Yes Historical Provider, MD  Linaclotide  (LINZESS) 290 MCG CAPS capsule Take 290 mcg by mouth daily.   Yes Historical Provider, MD  Naproxen Sodium (ALEVE) 220 MG CAPS Take 440 mg by mouth 2 (two) times daily as needed (Pain).   Yes Historical Provider, MD  phenytoin (DILANTIN) 100 MG ER capsule Take 100 mg by mouth 2 (two) times daily.    Yes Historical Provider, MD  ranitidine (ZANTAC) 150 MG capsule Take 150 mg by mouth 2 (two) times daily as needed for heartburn.   Yes Historical Provider, MD  amoxicillin (AMOXIL) 500 MG capsule Take 2 capsules (1,000 mg total) by mouth 2 (two) times daily. For 10 days 09/12/14   Tammy Triplett, PA-C  doxycycline (VIBRAMYCIN) 100 MG capsule Take 1 capsule (100 mg total) by mouth 2 (two) times daily. One po bid x 7 days 07/13/13   Shanon Rosser, MD  ibuprofen (ADVIL,MOTRIN) 800 MG tablet Take 1 tablet (800 mg total) by mouth every 8 (eight) hours as needed for mild pain. 10/11/14   Aseret Hoffman N Quirino Kakos, DO  ondansetron (ZOFRAN) 4 MG tablet Take 1 tablet (4 mg total) by mouth every 6 (six) hours. 06/27/13   Marissa Sciacca, PA-C  oxyCODONE-acetaminophen (PERCOCET/ROXICET) 5-325 MG per tablet Take 1 tablet by mouth every 4 (four) hours as needed. 10/11/14   Shaima Sardinas N Acquanetta Cabanilla, DO  traMADol (ULTRAM) 50 MG tablet Take 1 tablet (50 mg total) by  mouth every 6 (six) hours as needed. 09/12/14   Tammy Triplett, PA-C    ALLERGIES:  Allergies  Allergen Reactions  . Morphine And Related     "made me crazy"  . Vicodin [Hydrocodone-Acetaminophen] Itching    Patient stated causes itching and whelps    SOCIAL HISTORY:  Social History  Substance Use Topics  . Smoking status: Current Every Day Smoker -- 0.50 packs/day for 12 years    Types: Cigarettes  . Smokeless tobacco: Never Used  . Alcohol Use: No    FAMILY HISTORY: History reviewed. No pertinent family history.  EXAM: BP 111/74 mmHg  Pulse 87  Temp(Src) 98.3 F (36.8 C)  Resp 18  Ht 5' (1.524 m)  Wt 220 lb (99.791 kg)  BMI 42.97 kg/m2  SpO2 99%  LMP  08/21/2013 CONSTITUTIONAL: Alert and oriented and responds appropriately to questions. Well-appearing; well-nourished HEAD: Normocephalic EYES: Conjunctivae clear, PERRL ENT: normal nose; no rhinorrhea; moist mucous membranes; pharynx without lesions noted NECK: Supple, no meningismus, no LAD  CARD: RRR; S1 and S2 appreciated; no murmurs, no clicks, no rubs, no gallops RESP: Normal chest excursion without splinting or tachypnea; breath sounds clear and equal bilaterally; no wheezes, no rhonchi, no rales, no hypoxia or respiratory distress, speaking full sentences ABD/GI: Normal bowel sounds; non-distended; soft, non-tender, no rebound, no guarding, no peritoneal signs BACK:  The back appears normal and is non-tender to palpation, there is no CVA tenderness EXT: Patient is tender to palpation and has swelling to the left popliteal area without ecchymosis, no tenderness over the bony structures of the left knee, no ligamentous laxity, no erythema or warmth, no joint effusion, 2+ DP pulses bilaterally, Normal ROM in all joints; otherwise extruded these are non-tender to palpation; no edema; normal capillary refill; no cyanosis, no calf tenderness or swelling    SKIN: Normal color for age and race; warm NEURO: Moves all extremities equally, sensation to light touch intact diffusely, cranial nerves II through XII intact PSYCH: The patient's mood and manner are appropriate. Grooming and personal hygiene are appropriate.  MEDICAL DECISION MAKING: Patient here with pain in the left popliteal area. May be from muscle strain from recent cramping, Baker cyst. No history of injury to suggest patient needs an x-ray at this time. No signs of septic arthritis. Will order a venous Doppler of her left lower extremity to be performed in the morning. Will provide pain medication and dose of Lovenox in the emergency department. Have discussed return precautions and I have discussed with patient if Doppler in the morning  is normal she should follow-up with her primary care doctor. She verbalizes understanding and is comfortable with this plan.       [place], DO 10/11/14 [phone]

## 2014-10-11 NOTE — ED Notes (Signed)
Gave patient and family member something to drink as requested.

## 2014-10-11 NOTE — ED Notes (Signed)
Pt c/o left knee pain and denies any injury.

## 2014-10-11 NOTE — Discharge Instructions (Signed)
You were seen in the emergency department for pain in your left posterior knee. We have schedule you to return in the morning for an ultrasound of your leg to rule out a blood clot (DVT). We have given you a dose of blood thinners here in the emergency department that will last until tomorrow. If your ultrasound is negative, recommend he follow-up with her primary care physician if you have continued pain.   Knee Pain The knee is the complex joint between your thigh and your lower leg. It is made up of bones, tendons, ligaments, and cartilage. The bones that make up the knee are:  The femur in the thigh.  The tibia and fibula in the lower leg.  The patella or kneecap riding in the groove on the lower femur. CAUSES  Knee pain is a common complaint with many causes. A few of these causes are:  Injury, such as:  A ruptured ligament or tendon injury.  Torn cartilage.  Medical conditions, such as:  Gout  Arthritis  Infections  Overuse, over training, or overdoing a physical activity. Knee pain can be minor or severe. Knee pain can accompany debilitating injury. Minor knee problems often respond well to self-care measures or get well on their own. More serious injuries may need medical intervention or even surgery. SYMPTOMS The knee is complex. Symptoms of knee problems can vary widely. Some of the problems are:  Pain with movement and weight bearing.  Swelling and tenderness.  Buckling of the knee.  Inability to straighten or extend your knee.  Your knee locks and you cannot straighten it.  Warmth and redness with pain and fever.  Deformity or dislocation of the kneecap. DIAGNOSIS  Determining what is wrong may be very straight forward such as when there is an injury. It can also be challenging because of the complexity of the knee. Tests to make a diagnosis may include:  Your caregiver taking a history and doing a physical exam.  Routine X-rays can be used to rule out  other problems. X-rays will not reveal a cartilage tear. Some injuries of the knee can be diagnosed by:  Arthroscopy a surgical technique by which a small video camera is inserted through tiny incisions on the sides of the knee. This procedure is used to examine and repair internal knee joint problems. Tiny instruments can be used during arthroscopy to repair the torn knee cartilage (meniscus).  Arthrography is a radiology technique. A contrast liquid is directly injected into the knee joint. Internal structures of the knee joint then become visible on X-ray film.  An MRI scan is a non X-ray radiology procedure in which magnetic fields and a computer produce two- or three-dimensional images of the inside of the knee. Cartilage tears are often visible using an MRI scanner. MRI scans have largely replaced arthrography in diagnosing cartilage tears of the knee.  Blood work.  Examination of the fluid that helps to lubricate the knee joint (synovial fluid). This is done by taking a sample out using a needle and a syringe. TREATMENT The treatment of knee problems depends on the cause. Some of these treatments are:  Depending on the injury, proper casting, splinting, surgery, or physical therapy care will be needed.  Give yourself adequate recovery time. Do not overuse your joints. If you begin to get sore during workout routines, back off. Slow down or do fewer repetitions.  For repetitive activities such as cycling or running, maintain your strength and nutrition.  Alternate muscle groups.  For example, if you are a weight lifter, work the upper body on one day and the lower body the next.  Either tight or weak muscles do not give the proper support for your knee. Tight or weak muscles do not absorb the stress placed on the knee joint. Keep the muscles surrounding the knee strong.  Take care of mechanical problems.  If you have flat feet, orthotics or special shoes may help. See your caregiver if  you need help.  Arch supports, sometimes with wedges on the inner or outer aspect of the heel, can help. These can shift pressure away from the side of the knee most bothered by osteoarthritis.  A brace called an "unloader" brace also may be used to help ease the pressure on the most arthritic side of the knee.  If your caregiver has prescribed crutches, braces, wraps or ice, use as directed. The acronym for this is PRICE. This means protection, rest, ice, compression, and elevation.  Nonsteroidal anti-inflammatory drugs (NSAIDs), can help relieve pain. But if taken immediately after an injury, they may actually increase swelling. Take NSAIDs with food in your stomach. Stop them if you develop stomach problems. Do not take these if you have a history of ulcers, stomach pain, or bleeding from the bowel. Do not take without your caregiver's approval if you have problems with fluid retention, heart failure, or kidney problems.  For ongoing knee problems, physical therapy may be helpful.  Glucosamine and chondroitin are over-the-counter dietary supplements. Both may help relieve the pain of osteoarthritis in the knee. These medicines are different from the usual anti-inflammatory drugs. Glucosamine may decrease the rate of cartilage destruction.  Injections of a corticosteroid drug into your knee joint may help reduce the symptoms of an arthritis flare-up. They may provide pain relief that lasts a few months. You may have to wait a few months between injections. The injections do have a small increased risk of infection, water retention, and elevated blood sugar levels.  Hyaluronic acid injected into damaged joints may ease pain and provide lubrication. These injections may work by reducing inflammation. A series of shots may give relief for as long as 6 months.  Topical painkillers. Applying certain ointments to your skin may help relieve the pain and stiffness of osteoarthritis. Ask your pharmacist  for suggestions. Many over the-counter products are approved for temporary relief of arthritis pain.  In some countries, doctors often prescribe topical NSAIDs for relief of chronic conditions such as arthritis and tendinitis. A review of treatment with NSAID creams found that they worked as well as oral medications but without the serious side effects. PREVENTION  Maintain a healthy weight. Extra pounds put more strain on your joints.  Get strong, stay limber. Weak muscles are a common cause of knee injuries. Stretching is important. Include flexibility exercises in your workouts.  Be smart about exercise. If you have osteoarthritis, chronic knee pain or recurring injuries, you may need to change the way you exercise. This does not mean you have to stop being active. If your knees ache after jogging or playing basketball, consider switching to swimming, water aerobics, or other low-impact activities, at least for a few days a week. Sometimes limiting high-impact activities will provide relief.  Make sure your shoes fit well. Choose footwear that is right for your sport.  Protect your knees. Use the proper gear for knee-sensitive activities. Use kneepads when playing volleyball or laying carpet. Buckle your seat belt every time you drive. Most shattered  kneecaps occur in car accidents.  Rest when you are tired. SEEK MEDICAL CARE IF:  You have knee pain that is continual and does not seem to be getting better.  SEEK IMMEDIATE MEDICAL CARE IF:  Your knee joint feels hot to the touch and you have a high fever. MAKE SURE YOU:   Understand these instructions.  Will watch your condition.  Will get help right away if you are not doing well or get worse. Document Released: 11/30/2006 Document Revised: 04/27/2011 Document Reviewed: 11/30/2006 [employer] Patient Information 2015 [place], [place]. This information is not intended to replace advice given to you by your health care provider. Make sure  you discuss any questions you have with your health care provider.

## 2014-10-16 ENCOUNTER — Emergency Department (HOSPITAL_COMMUNITY)
Admission: EM | Admit: 2014-10-16 | Discharge: 2014-10-17 | Disposition: A | Discharge status: Home / Self Care | Payer: Medicare Other | Attending: Emergency Medicine | Admitting: Emergency Medicine

## 2014-10-16 ENCOUNTER — Encounter (HOSPITAL_COMMUNITY): Payer: Self-pay | Admitting: Emergency Medicine

## 2014-10-16 DIAGNOSIS — R011 Cardiac murmur, unspecified: Secondary | ICD-10-CM | POA: Diagnosis not present

## 2014-10-16 DIAGNOSIS — Z87442 Personal history of urinary calculi: Secondary | ICD-10-CM | POA: Diagnosis not present

## 2014-10-16 DIAGNOSIS — H9201 Otalgia, right ear: Secondary | ICD-10-CM | POA: Diagnosis present

## 2014-10-16 DIAGNOSIS — Z720 Tobacco use: Secondary | ICD-10-CM | POA: Insufficient documentation

## 2014-10-16 DIAGNOSIS — H65191 Other acute nonsuppurative otitis media, right ear: Secondary | ICD-10-CM | POA: Diagnosis not present

## 2014-10-16 DIAGNOSIS — K219 Gastro-esophageal reflux disease without esophagitis: Secondary | ICD-10-CM | POA: Insufficient documentation

## 2014-10-16 DIAGNOSIS — J45909 Unspecified asthma, uncomplicated: Secondary | ICD-10-CM | POA: Insufficient documentation

## 2014-10-16 DIAGNOSIS — G40909 Epilepsy, unspecified, not intractable, without status epilepticus: Secondary | ICD-10-CM | POA: Insufficient documentation

## 2014-10-16 DIAGNOSIS — Z8701 Personal history of pneumonia (recurrent): Secondary | ICD-10-CM | POA: Insufficient documentation

## 2014-10-16 DIAGNOSIS — Z79899 Other long term (current) drug therapy: Secondary | ICD-10-CM | POA: Insufficient documentation

## 2014-10-16 NOTE — ED Notes (Signed)
Pt c/o rt ear pain x 2 days. 

## 2014-10-17 DIAGNOSIS — H65191 Other acute nonsuppurative otitis media, right ear: Secondary | ICD-10-CM | POA: Diagnosis not present

## 2014-10-17 MED ORDER — AMOXICILLIN-POT CLAVULANATE 875-125 MG PO TABS
1.0000 | ORAL_TABLET | Freq: Once | ORAL | Status: AC
  Administered 2014-10-17: 1 via ORAL
  Filled 2014-10-17: qty 1

## 2014-10-17 MED ORDER — TRAMADOL HCL 50 MG PO TABS: 50.0000 mg | ORAL_TABLET | Freq: Four times a day (QID) | ORAL | Status: DC | PRN

## 2014-10-17 MED ORDER — OXYCODONE-ACETAMINOPHEN 5-325 MG PO TABS
1.0000 | ORAL_TABLET | Freq: Once | ORAL | Status: AC
  Administered 2014-10-17: 1 via ORAL
  Filled 2014-10-17: qty 1

## 2014-10-17 MED ORDER — AMOXICILLIN-POT CLAVULANATE 875-125 MG PO TABS: 1.0000 | ORAL_TABLET | Freq: Two times a day (BID) | ORAL | Status: DC

## 2014-10-17 NOTE — Discharge Instructions (Signed)

## 2014-10-17 NOTE — ED Provider Notes (Signed)
CSN: [id number]     Arrival date & time 10/16/14  2326 History   First MD Initiated Contact with Patient 10/16/14 2335     Chief Complaint  Patient presents with  . Otalgia     (Consider location/radiation/quality/duration/timing/severity/associated sxs/prior Treatment) HPI   Lisa Simpson is a 39 y.o. female who presents to the Emergency Department complaining of right ear pain for 2 days.  She states that she was treated for mastoiditis two years ago and has occasional recurrent ear pain.  She describes a dull, pressure sensation to her ear with decreased hearing from the right ear.  Pain is improved with heat.  She denies fever, swelling of the ear, sore throat, neck pain or drainage form the ear.     Past Medical History  Diagnosis Date  . Asthma   . Gall stones 2014  . History of kidney stones     2 currently  . Seizures 2013  . Heart palpitations     "feels fluttery"  . Heart murmur   . Borderline hypercholesterolemia   . Pneumonia oct 2014    hx of  . GERD (gastroesophageal reflux disease)   . Headache(784.0)     occasional  . Family history of anesthesia complication     "dad woke up during surgery"  . Mastoiditis of left side 2015   Past Surgical History  Procedure Laterality Date  . Tonsillectomy      as child  . Multiple tooth extractions    . Cholecystectomy N/A 06/28/2013    Procedure: LAPAROSCOPIC CHOLECYSTECTOMY WITH INTRAOPERATIVE CHOLANGIOGRAM;  Surgeon: Adin Hector, MD;  Location: [facility];  Service: General;  Laterality: N/A;   History reviewed. No pertinent family history. Social History  Substance Use Topics  . Smoking status: Current Every Day Smoker -- 0.50 packs/day for 12 years    Types: Cigarettes  . Smokeless tobacco: Never Used  . Alcohol Use: No   OB History    Gravida Para Term Preterm AB TAB SAB Ectopic Multiple Living   3 3        3      Review of Systems  Constitutional: Negative for fever, chills, activity change and  appetite change.  HENT: Positive for ear pain. Negative for congestion, facial swelling, rhinorrhea, sore throat and trouble swallowing.   Eyes: Negative for visual disturbance.  Respiratory: Negative for cough, shortness of breath, wheezing and stridor.   Gastrointestinal: Negative for nausea and vomiting.  Musculoskeletal: Negative for neck pain and neck stiffness.  Skin: Negative.   Neurological: Negative for dizziness, weakness, numbness and headaches.  Hematological: Negative for adenopathy.  Psychiatric/Behavioral: Negative for confusion.  All other systems reviewed and are negative.     Allergies  Morphine and related and Vicodin  Home Medications   Prior to Admission medications   Medication Sig Start Date End Date Taking? Authorizing Provider  albuterol (PROVENTIL HFA;VENTOLIN HFA) 108 (90 BASE) MCG/ACT inhaler Inhale 4 puffs into the lungs every 6 (six) hours as needed. For ease of Breathing   Yes Historical Provider, MD  esomeprazole (NEXIUM) 40 MG capsule Take 40 mg by mouth daily at 12 noon.   Yes Historical Provider, MD  hydrOXYzine (VISTARIL) 25 MG capsule Take 25 mg by mouth at bedtime as needed for itching.    Yes Historical Provider, MD  phenytoin (DILANTIN) 100 MG ER capsule Take 100 mg by mouth 2 (two) times daily.    Yes Historical Provider, MD  amoxicillin (AMOXIL) 500 MG capsule Take  2 capsules (1,000 mg total) by mouth 2 (two) times daily. For 10 days 09/12/14   Denay Pleitez, PA-C  doxycycline (VIBRAMYCIN) 100 MG capsule Take 1 capsule (100 mg total) by mouth 2 (two) times daily. One po bid x 7 days 07/13/13   Shanon Rosser, MD  ibuprofen (ADVIL,MOTRIN) 800 MG tablet Take 1 tablet (800 mg total) by mouth every 8 (eight) hours as needed for mild pain. 10/11/14   Kristen N Ward, DO  Linaclotide (LINZESS) 290 MCG CAPS capsule Take 290 mcg by mouth daily.    Historical Provider, MD  Naproxen Sodium (ALEVE) 220 MG CAPS Take 440 mg by mouth 2 (two) times daily as needed  (Pain).    Historical Provider, MD  ondansetron (ZOFRAN) 4 MG tablet Take 1 tablet (4 mg total) by mouth every 6 (six) hours. 06/27/13   Marissa Sciacca, PA-C  oxyCODONE-acetaminophen (PERCOCET/ROXICET) 5-325 MG per tablet Take 1 tablet by mouth every 4 (four) hours as needed. 10/11/14   Kristen N Ward, DO  ranitidine (ZANTAC) 150 MG capsule Take 150 mg by mouth 2 (two) times daily as needed for heartburn.    Historical Provider, MD  traMADol (ULTRAM) 50 MG tablet Take 1 tablet (50 mg total) by mouth every 6 (six) hours as needed. 09/12/14   Carrington Olazabal, PA-C   BP 129/68 mmHg  Pulse 74  Temp(Src) 97.4 F (36.3 C) (Oral)  Ht 5\' 2"  (1.575 m)  Wt 210 lb (95.255 kg)  BMI 38.40 kg/m2  SpO2 98%  LMP 09/23/2014 Physical Exam  Constitutional: She is oriented to person, place, and time. She appears well-developed and well-nourished. No distress.  HENT:  Head: Normocephalic and atraumatic.  Mouth/Throat: Uvula is midline, oropharynx is clear and moist and mucous membranes are normal. No uvula swelling. No oropharyngeal exudate.  Tenderness to percussion over the right mastoid w/o otorrhea.   Erythema of the right TM, no bulging.  Mild middle ear effusion present.  No  edema of the ear canal, TM appears intact.    Neck: Normal range of motion. Neck supple.  Cardiovascular: Normal rate, regular rhythm, normal heart sounds and intact distal pulses.   No murmur heard. Pulmonary/Chest: Effort normal and breath sounds normal. No stridor. No respiratory distress.  Musculoskeletal: Normal range of motion.  Lymphadenopathy:    She has no cervical adenopathy.  Neurological: She is alert and oriented to person, place, and time. Coordination normal.  Skin: Skin is warm and dry. No rash noted.  Nursing note and vitals reviewed.   ED Course  Procedures (including critical care time) Labs Review Labs Reviewed - No data to display   EKG Interpretation None      MDM   Final diagnoses:  None     Patient with right OM and likely chronic mastoiditis.  She is well appearing, non-toxic.  Vitals are stable.  She appears stable for d/c and I will Rx augmentin and give referral to ENT    Kem Parkinson, PA-C 10/17/14 0028  Rolland Porter, MD 10/17/14 [phone]

## 2015-02-22 DIAGNOSIS — J45909 Unspecified asthma, uncomplicated: Secondary | ICD-10-CM | POA: Diagnosis not present

## 2015-02-22 DIAGNOSIS — R52 Pain, unspecified: Secondary | ICD-10-CM | POA: Diagnosis not present

## 2015-02-24 DIAGNOSIS — Z881 Allergy status to other antibiotic agents status: Secondary | ICD-10-CM | POA: Diagnosis not present

## 2015-02-24 DIAGNOSIS — F17200 Nicotine dependence, unspecified, uncomplicated: Secondary | ICD-10-CM | POA: Diagnosis not present

## 2015-02-24 DIAGNOSIS — Z79899 Other long term (current) drug therapy: Secondary | ICD-10-CM | POA: Diagnosis not present

## 2015-02-24 DIAGNOSIS — N761 Subacute and chronic vaginitis: Secondary | ICD-10-CM | POA: Diagnosis not present

## 2015-02-24 DIAGNOSIS — Z0389 Encounter for observation for other suspected diseases and conditions ruled out: Secondary | ICD-10-CM | POA: Diagnosis not present

## 2015-02-24 DIAGNOSIS — Z885 Allergy status to narcotic agent status: Secondary | ICD-10-CM | POA: Diagnosis not present

## 2015-02-26 ENCOUNTER — Other Ambulatory Visit (HOSPITAL_COMMUNITY)
Admission: RE | Admit: 2015-02-26 | Discharge: 2015-02-26 | Disposition: A | Discharge status: Home / Self Care | Payer: Medicare Other | Source: Ambulatory Visit | Attending: Obstetrics & Gynecology | Admitting: Obstetrics & Gynecology

## 2015-02-26 ENCOUNTER — Ambulatory Visit (INDEPENDENT_AMBULATORY_CARE_PROVIDER_SITE_OTHER): Payer: Medicare Other | Admitting: Obstetrics & Gynecology

## 2015-02-26 VITALS — BP 120/70 | HR 76 | Ht 62.0 in | Wt 242.0 lb

## 2015-02-26 DIAGNOSIS — Z124 Encounter for screening for malignant neoplasm of cervix: Secondary | ICD-10-CM | POA: Diagnosis not present

## 2015-02-26 DIAGNOSIS — Z1151 Encounter for screening for human papillomavirus (HPV): Secondary | ICD-10-CM | POA: Diagnosis not present

## 2015-02-26 DIAGNOSIS — Z01419 Encounter for gynecological examination (general) (routine) without abnormal findings: Secondary | ICD-10-CM | POA: Insufficient documentation

## 2015-02-26 NOTE — Progress Notes (Signed)
Patient ID: Lisa Simpson, female   DOB: Dec 24, 1975, 40 y.o.   MRN: [id number] Subjective:     Lisa Simpson is a 40 y.o. female here for a routine exam.  Patient's last menstrual period was 02/19/2015. G3P3 Birth Control Method:  vasectomy Menstrual Calendar(currently): regular monthly  Current complaints: none, recently went to ED with a retained tampon and was told to get a Pap smear.   Current acute medical issues:  none   Recent Gynecologic History Patient's last menstrual period was 02/19/2015. Last Pap: 2010,  normal Last mammogram: lst year,  normal  Past Medical History  Diagnosis Date  . Asthma   . Gall stones 2014  . History of kidney stones     2 currently  . Seizures 2013  . Heart palpitations     "feels fluttery"  . Heart murmur   . Borderline hypercholesterolemia   . Pneumonia oct 2014    hx of  . GERD (gastroesophageal reflux disease)   . Headache(784.0)     occasional  . Family history of anesthesia complication     "dad woke up during surgery"  . Mastoiditis of left side 2015    Past Surgical History  Procedure Laterality Date  . Tonsillectomy      as child  . Multiple tooth extractions    . Cholecystectomy N/A 06/28/2013    Procedure: LAPAROSCOPIC CHOLECYSTECTOMY WITH INTRAOPERATIVE CHOLANGIOGRAM;  Surgeon: Adin Hector, MD;  Location: [facility];  Service: General;  Laterality: N/A;    OB History    Gravida Para Term Preterm AB TAB SAB Ectopic Multiple Living   _0 Social History   Social History  . Marital Status: Legally Separated    Spouse Name: N/A  . Number of Children: N/A  . Years of Education: N/A   Social History Main Topics  . Smoking status: Current Every Day Smoker -- 0.50 packs/day for 12 years    Types: Cigarettes  . Smokeless tobacco: Never Used  . Alcohol Use: No  . Drug Use: No  . Sexual Activity: Yes    Birth Control/ Protection: None   Other Topics Concern  . Not on file   Social History  Narrative    No family history on file.   Current outpatient prescriptions:  .  albuterol (PROVENTIL HFA;VENTOLIN HFA) 108 (90 BASE) MCG/ACT inhaler, Inhale 4 puffs into the lungs every 6 (six) hours as needed. For ease of Breathing, Disp: , Rfl:  .  esomeprazole (NEXIUM) 40 MG capsule, Take 40 mg by mouth daily at 12 noon., Disp: , Rfl:  .  phenytoin (DILANTIN) 100 MG ER capsule, Take 100 mg by mouth 2 (two) times daily. , Disp: , Rfl:  .  ibuprofen (ADVIL,MOTRIN) 800 MG tablet, Take 1 tablet (800 mg total) by mouth every 8 (eight) hours as needed for mild pain. (Patient not taking: Reported on 02/26/2015), Disp: 30 tablet, Rfl: 0  Review of Systems  Review of Systems  Constitutional: Negative for fever, chills, weight loss, malaise/fatigue and diaphoresis.  HENT: Negative for hearing loss, ear pain, nosebleeds, congestion, sore throat, neck pain, tinnitus and ear discharge.   Eyes: Negative for blurred vision, double vision, photophobia, pain, discharge and redness.  Respiratory: Negative for cough, hemoptysis, sputum production, shortness of breath, wheezing and stridor.   Cardiovascular: Negative for chest pain, palpitations, orthopnea, claudication, leg swelling and PND.  Gastrointestinal: negative for abdominal pain. Negative  for heartburn, nausea, vomiting, diarrhea, constipation, blood in stool and melena.  Genitourinary: Negative for dysuria, urgency, frequency, hematuria and flank pain.  Musculoskeletal: Negative for myalgias, back pain, joint pain and falls.  Skin: Negative for itching and rash.  Neurological: Negative for dizziness, tingling, tremors, sensory change, speech change, focal weakness, seizures, loss of consciousness, weakness and headaches.  Endo/Heme/Allergies: Negative for environmental allergies and polydipsia. Does not bruise/bleed easily.  Psychiatric/Behavioral: Negative for depression, suicidal ideas, hallucinations, memory loss and substance abuse. The  patient is not nervous/anxious and does not have insomnia.        Objective:  Blood pressure 120/70, pulse 76, height _0  (1.575 m), weight 242 lb (109.77 kg), last menstrual period 02/19/2015.   Physical Exam  Vitals reviewed. Constitutional: She is oriented to person, place, and time. She appears well-developed and well-nourished.  HENT:  Head: Normocephalic and atraumatic.        Right Ear: External ear normal.  Left Ear: External ear normal.  Nose: Nose normal.  Mouth/Throat: Oropharynx is clear and moist.  Eyes: Conjunctivae and EOM are normal. Pupils are equal, round, and reactive to light. Right eye exhibits no discharge. Left eye exhibits no discharge. No scleral icterus.  Neck: Normal range of motion. Neck supple. No tracheal deviation present. No thyromegaly present.  Cardiovascular: Normal rate, regular rhythm, normal heart sounds and intact distal pulses.  Exam reveals no gallop and no friction rub.   No murmur heard. Respiratory: Effort normal and breath sounds normal. No respiratory distress. She has no wheezes. She has no rales. She exhibits no tenderness.  GI: Soft. Bowel sounds are normal. She exhibits no distension and no mass. There is no tenderness. There is no rebound and no guarding.  Genitourinary:  Breasts no masses skin changes or nipple changes bilaterally      Vulva is normal without lesions Vagina is pink moist without discharge Cervix normal in appearance and pap is done Uterus is normal size shape and contour Adnexa is negative with normal sized ovaries   Musculoskeletal: Normal range of motion. She exhibits no edema and no tenderness.  Neurological: She is alert and oriented to person, place, and time. She has normal reflexes. She displays normal reflexes. No cranial nerve deficit. She exhibits normal muscle tone. Coordination normal.  Skin: Skin is warm and dry. No rash noted. No erythema. No pallor.  Psychiatric: She has a normal mood and affect. Her  behavior is normal. Judgment and thought content normal.       Assessment:    Healthy female exam.    Plan:    Contraception: vasectomy. Follow up in: 1 year.

## 2015-02-28 LAB — CYTOLOGY - PAP

## 2015-03-14 ENCOUNTER — Telehealth: Payer: Self-pay | Admitting: Obstetrics & Gynecology

## 2015-03-14 NOTE — Telephone Encounter (Signed)
Pt informed of WNL pap results from 02/26/2015.

## 2015-03-16 DIAGNOSIS — Z79899 Other long term (current) drug therapy: Secondary | ICD-10-CM | POA: Diagnosis not present

## 2015-03-16 DIAGNOSIS — Z86718 Personal history of other venous thrombosis and embolism: Secondary | ICD-10-CM | POA: Diagnosis not present

## 2015-03-16 DIAGNOSIS — F17200 Nicotine dependence, unspecified, uncomplicated: Secondary | ICD-10-CM | POA: Diagnosis not present

## 2015-03-16 DIAGNOSIS — R091 Pleurisy: Secondary | ICD-10-CM | POA: Diagnosis not present

## 2015-03-16 DIAGNOSIS — Z885 Allergy status to narcotic agent status: Secondary | ICD-10-CM | POA: Diagnosis not present

## 2015-03-16 DIAGNOSIS — J209 Acute bronchitis, unspecified: Secondary | ICD-10-CM | POA: Diagnosis not present

## 2015-03-16 DIAGNOSIS — R0602 Shortness of breath: Secondary | ICD-10-CM | POA: Diagnosis not present

## 2015-03-16 DIAGNOSIS — E669 Obesity, unspecified: Secondary | ICD-10-CM | POA: Diagnosis not present

## 2015-03-16 DIAGNOSIS — Z881 Allergy status to other antibiotic agents status: Secondary | ICD-10-CM | POA: Diagnosis not present

## 2015-03-22 DIAGNOSIS — R52 Pain, unspecified: Secondary | ICD-10-CM | POA: Diagnosis not present

## 2015-03-22 DIAGNOSIS — J209 Acute bronchitis, unspecified: Secondary | ICD-10-CM | POA: Diagnosis not present

## 2015-03-22 DIAGNOSIS — R300 Dysuria: Secondary | ICD-10-CM | POA: Diagnosis not present

## 2015-03-22 DIAGNOSIS — R310 Gross hematuria: Secondary | ICD-10-CM | POA: Diagnosis not present

## 2015-04-19 DIAGNOSIS — R52 Pain, unspecified: Secondary | ICD-10-CM | POA: Diagnosis not present

## 2015-04-19 DIAGNOSIS — G40909 Epilepsy, unspecified, not intractable, without status epilepticus: Secondary | ICD-10-CM | POA: Diagnosis not present

## 2015-04-19 DIAGNOSIS — I10 Essential (primary) hypertension: Secondary | ICD-10-CM | POA: Diagnosis not present

## 2015-04-19 DIAGNOSIS — Z5181 Encounter for therapeutic drug level monitoring: Secondary | ICD-10-CM | POA: Diagnosis not present

## 2015-04-19 DIAGNOSIS — J45909 Unspecified asthma, uncomplicated: Secondary | ICD-10-CM | POA: Diagnosis not present

## 2015-04-29 DIAGNOSIS — R0789 Other chest pain: Secondary | ICD-10-CM | POA: Diagnosis not present

## 2015-04-29 DIAGNOSIS — N390 Urinary tract infection, site not specified: Secondary | ICD-10-CM | POA: Diagnosis not present

## 2015-04-29 DIAGNOSIS — Z6841 Body Mass Index (BMI) 40.0 and over, adult: Secondary | ICD-10-CM | POA: Diagnosis not present

## 2015-04-29 DIAGNOSIS — R079 Chest pain, unspecified: Secondary | ICD-10-CM | POA: Diagnosis not present

## 2015-04-29 DIAGNOSIS — E6609 Other obesity due to excess calories: Secondary | ICD-10-CM | POA: Diagnosis not present

## 2015-04-29 DIAGNOSIS — G40909 Epilepsy, unspecified, not intractable, without status epilepticus: Secondary | ICD-10-CM | POA: Diagnosis not present

## 2015-04-29 DIAGNOSIS — Z881 Allergy status to other antibiotic agents status: Secondary | ICD-10-CM | POA: Diagnosis not present

## 2015-04-29 DIAGNOSIS — Z23 Encounter for immunization: Secondary | ICD-10-CM | POA: Diagnosis not present

## 2015-04-29 DIAGNOSIS — K219 Gastro-esophageal reflux disease without esophagitis: Secondary | ICD-10-CM | POA: Diagnosis not present

## 2015-04-29 DIAGNOSIS — R0602 Shortness of breath: Secondary | ICD-10-CM | POA: Diagnosis not present

## 2015-04-29 DIAGNOSIS — J45909 Unspecified asthma, uncomplicated: Secondary | ICD-10-CM | POA: Diagnosis not present

## 2015-04-29 DIAGNOSIS — Z885 Allergy status to narcotic agent status: Secondary | ICD-10-CM | POA: Diagnosis not present

## 2015-04-29 DIAGNOSIS — Z888 Allergy status to other drugs, medicaments and biological substances status: Secondary | ICD-10-CM | POA: Diagnosis not present

## 2015-04-29 DIAGNOSIS — E785 Hyperlipidemia, unspecified: Secondary | ICD-10-CM | POA: Diagnosis not present

## 2015-04-30 DIAGNOSIS — R079 Chest pain, unspecified: Secondary | ICD-10-CM | POA: Diagnosis not present

## 2015-05-20 DIAGNOSIS — Z79899 Other long term (current) drug therapy: Secondary | ICD-10-CM | POA: Diagnosis not present

## 2015-05-20 DIAGNOSIS — J029 Acute pharyngitis, unspecified: Secondary | ICD-10-CM | POA: Diagnosis not present

## 2015-05-20 DIAGNOSIS — Z7951 Long term (current) use of inhaled steroids: Secondary | ICD-10-CM | POA: Diagnosis not present

## 2015-05-20 DIAGNOSIS — J45909 Unspecified asthma, uncomplicated: Secondary | ICD-10-CM | POA: Diagnosis not present

## 2015-05-20 DIAGNOSIS — E7800 Pure hypercholesterolemia, unspecified: Secondary | ICD-10-CM | POA: Diagnosis not present

## 2015-05-20 DIAGNOSIS — F17200 Nicotine dependence, unspecified, uncomplicated: Secondary | ICD-10-CM | POA: Diagnosis not present

## 2015-05-20 DIAGNOSIS — J40 Bronchitis, not specified as acute or chronic: Secondary | ICD-10-CM | POA: Diagnosis not present

## 2015-05-20 DIAGNOSIS — J209 Acute bronchitis, unspecified: Secondary | ICD-10-CM | POA: Diagnosis not present

## 2015-05-24 DIAGNOSIS — R52 Pain, unspecified: Secondary | ICD-10-CM | POA: Diagnosis not present

## 2015-05-24 DIAGNOSIS — J129 Viral pneumonia, unspecified: Secondary | ICD-10-CM | POA: Diagnosis not present

## 2015-05-24 DIAGNOSIS — R1310 Dysphagia, unspecified: Secondary | ICD-10-CM | POA: Diagnosis not present

## 2015-05-31 DIAGNOSIS — J129 Viral pneumonia, unspecified: Secondary | ICD-10-CM | POA: Diagnosis not present

## 2015-05-31 DIAGNOSIS — J209 Acute bronchitis, unspecified: Secondary | ICD-10-CM | POA: Diagnosis not present

## 2015-05-31 DIAGNOSIS — J45909 Unspecified asthma, uncomplicated: Secondary | ICD-10-CM | POA: Diagnosis not present

## 2015-06-18 DIAGNOSIS — R1310 Dysphagia, unspecified: Secondary | ICD-10-CM | POA: Diagnosis not present

## 2015-06-18 DIAGNOSIS — J129 Viral pneumonia, unspecified: Secondary | ICD-10-CM | POA: Diagnosis not present

## 2015-06-18 DIAGNOSIS — R52 Pain, unspecified: Secondary | ICD-10-CM | POA: Diagnosis not present

## 2015-07-19 DIAGNOSIS — R52 Pain, unspecified: Secondary | ICD-10-CM | POA: Diagnosis not present

## 2015-07-19 DIAGNOSIS — Z6841 Body Mass Index (BMI) 40.0 and over, adult: Secondary | ICD-10-CM | POA: Diagnosis not present

## 2015-07-19 DIAGNOSIS — F17290 Nicotine dependence, other tobacco product, uncomplicated: Secondary | ICD-10-CM | POA: Diagnosis not present

## 2015-08-16 DIAGNOSIS — M25552 Pain in left hip: Secondary | ICD-10-CM | POA: Diagnosis not present

## 2015-08-16 DIAGNOSIS — M79606 Pain in leg, unspecified: Secondary | ICD-10-CM | POA: Diagnosis not present

## 2015-08-16 DIAGNOSIS — R102 Pelvic and perineal pain: Secondary | ICD-10-CM | POA: Diagnosis not present

## 2015-10-10 DIAGNOSIS — G4700 Insomnia, unspecified: Secondary | ICD-10-CM | POA: Diagnosis not present

## 2015-10-10 DIAGNOSIS — G40909 Epilepsy, unspecified, not intractable, without status epilepticus: Secondary | ICD-10-CM | POA: Diagnosis not present

## 2015-10-10 DIAGNOSIS — E785 Hyperlipidemia, unspecified: Secondary | ICD-10-CM | POA: Diagnosis not present

## 2015-10-10 DIAGNOSIS — L509 Urticaria, unspecified: Secondary | ICD-10-CM | POA: Diagnosis not present

## 2015-10-10 DIAGNOSIS — Z1389 Encounter for screening for other disorder: Secondary | ICD-10-CM | POA: Diagnosis not present

## 2015-10-10 DIAGNOSIS — J45909 Unspecified asthma, uncomplicated: Secondary | ICD-10-CM | POA: Diagnosis not present

## 2015-10-10 DIAGNOSIS — K219 Gastro-esophageal reflux disease without esophagitis: Secondary | ICD-10-CM | POA: Diagnosis not present

## 2015-10-10 DIAGNOSIS — Z6841 Body Mass Index (BMI) 40.0 and over, adult: Secondary | ICD-10-CM | POA: Diagnosis not present

## 2015-10-10 DIAGNOSIS — M549 Dorsalgia, unspecified: Secondary | ICD-10-CM | POA: Diagnosis not present

## 2015-11-18 DIAGNOSIS — R05 Cough: Secondary | ICD-10-CM | POA: Diagnosis not present

## 2015-11-18 DIAGNOSIS — J069 Acute upper respiratory infection, unspecified: Secondary | ICD-10-CM | POA: Diagnosis not present

## 2015-11-18 DIAGNOSIS — J209 Acute bronchitis, unspecified: Secondary | ICD-10-CM | POA: Diagnosis not present

## 2015-11-18 DIAGNOSIS — F17200 Nicotine dependence, unspecified, uncomplicated: Secondary | ICD-10-CM | POA: Diagnosis not present

## 2015-11-18 DIAGNOSIS — J019 Acute sinusitis, unspecified: Secondary | ICD-10-CM | POA: Diagnosis not present

## 2015-11-22 DIAGNOSIS — J209 Acute bronchitis, unspecified: Secondary | ICD-10-CM | POA: Diagnosis not present

## 2015-12-01 DIAGNOSIS — J40 Bronchitis, not specified as acute or chronic: Secondary | ICD-10-CM | POA: Diagnosis not present

## 2015-12-02 DIAGNOSIS — J4541 Moderate persistent asthma with (acute) exacerbation: Secondary | ICD-10-CM | POA: Diagnosis not present

## 2015-12-02 DIAGNOSIS — K219 Gastro-esophageal reflux disease without esophagitis: Secondary | ICD-10-CM | POA: Diagnosis not present

## 2015-12-02 DIAGNOSIS — Z79899 Other long term (current) drug therapy: Secondary | ICD-10-CM | POA: Diagnosis not present

## 2015-12-02 DIAGNOSIS — Z6841 Body Mass Index (BMI) 40.0 and over, adult: Secondary | ICD-10-CM | POA: Diagnosis not present

## 2015-12-02 DIAGNOSIS — L509 Urticaria, unspecified: Secondary | ICD-10-CM | POA: Diagnosis not present

## 2015-12-02 DIAGNOSIS — R635 Abnormal weight gain: Secondary | ICD-10-CM | POA: Diagnosis not present

## 2015-12-02 DIAGNOSIS — E785 Hyperlipidemia, unspecified: Secondary | ICD-10-CM | POA: Diagnosis not present

## 2015-12-02 DIAGNOSIS — Z23 Encounter for immunization: Secondary | ICD-10-CM | POA: Diagnosis not present

## 2015-12-02 DIAGNOSIS — G40909 Epilepsy, unspecified, not intractable, without status epilepticus: Secondary | ICD-10-CM | POA: Diagnosis not present

## 2015-12-02 DIAGNOSIS — G4700 Insomnia, unspecified: Secondary | ICD-10-CM | POA: Diagnosis not present

## 2015-12-02 DIAGNOSIS — F17200 Nicotine dependence, unspecified, uncomplicated: Secondary | ICD-10-CM | POA: Diagnosis not present

## 2015-12-03 DIAGNOSIS — R05 Cough: Secondary | ICD-10-CM | POA: Diagnosis not present

## 2015-12-03 DIAGNOSIS — R0602 Shortness of breath: Secondary | ICD-10-CM | POA: Diagnosis not present

## 2015-12-06 DIAGNOSIS — J029 Acute pharyngitis, unspecified: Secondary | ICD-10-CM | POA: Diagnosis not present

## 2015-12-06 DIAGNOSIS — R05 Cough: Secondary | ICD-10-CM | POA: Diagnosis not present

## 2015-12-06 DIAGNOSIS — Z6841 Body Mass Index (BMI) 40.0 and over, adult: Secondary | ICD-10-CM | POA: Diagnosis not present

## 2015-12-06 DIAGNOSIS — D72829 Elevated white blood cell count, unspecified: Secondary | ICD-10-CM | POA: Diagnosis not present

## 2016-03-11 DIAGNOSIS — J111 Influenza due to unidentified influenza virus with other respiratory manifestations: Secondary | ICD-10-CM | POA: Diagnosis not present

## 2016-04-27 DIAGNOSIS — Z9181 History of falling: Secondary | ICD-10-CM | POA: Diagnosis not present

## 2016-04-27 DIAGNOSIS — M25561 Pain in right knee: Secondary | ICD-10-CM | POA: Diagnosis not present

## 2016-06-02 DIAGNOSIS — J302 Other seasonal allergic rhinitis: Secondary | ICD-10-CM | POA: Diagnosis not present

## 2016-06-02 DIAGNOSIS — J209 Acute bronchitis, unspecified: Secondary | ICD-10-CM | POA: Diagnosis not present

## 2016-06-02 DIAGNOSIS — H60532 Acute contact otitis externa, left ear: Secondary | ICD-10-CM | POA: Diagnosis not present

## 2016-11-15 DIAGNOSIS — R05 Cough: Secondary | ICD-10-CM | POA: Diagnosis not present

## 2016-11-15 DIAGNOSIS — E669 Obesity, unspecified: Secondary | ICD-10-CM | POA: Diagnosis not present

## 2016-11-15 DIAGNOSIS — F17210 Nicotine dependence, cigarettes, uncomplicated: Secondary | ICD-10-CM | POA: Diagnosis not present

## 2016-11-15 DIAGNOSIS — Z79899 Other long term (current) drug therapy: Secondary | ICD-10-CM | POA: Diagnosis not present

## 2016-11-15 DIAGNOSIS — R569 Unspecified convulsions: Secondary | ICD-10-CM | POA: Diagnosis not present

## 2016-11-15 DIAGNOSIS — Z720 Tobacco use: Secondary | ICD-10-CM | POA: Diagnosis not present

## 2016-11-15 DIAGNOSIS — I10 Essential (primary) hypertension: Secondary | ICD-10-CM | POA: Diagnosis not present

## 2017-01-04 DIAGNOSIS — J45909 Unspecified asthma, uncomplicated: Secondary | ICD-10-CM | POA: Diagnosis not present

## 2017-01-04 DIAGNOSIS — Z79899 Other long term (current) drug therapy: Secondary | ICD-10-CM | POA: Diagnosis not present

## 2017-01-04 DIAGNOSIS — T148XXA Other injury of unspecified body region, initial encounter: Secondary | ICD-10-CM | POA: Diagnosis not present

## 2017-01-04 DIAGNOSIS — M25561 Pain in right knee: Secondary | ICD-10-CM | POA: Diagnosis not present

## 2017-01-04 DIAGNOSIS — R569 Unspecified convulsions: Secondary | ICD-10-CM | POA: Diagnosis not present

## 2017-01-04 DIAGNOSIS — M1990 Unspecified osteoarthritis, unspecified site: Secondary | ICD-10-CM | POA: Diagnosis not present

## 2017-01-04 DIAGNOSIS — M7041 Prepatellar bursitis, right knee: Secondary | ICD-10-CM | POA: Diagnosis not present

## 2017-01-04 DIAGNOSIS — F17200 Nicotine dependence, unspecified, uncomplicated: Secondary | ICD-10-CM | POA: Diagnosis not present

## 2017-01-04 DIAGNOSIS — S7011XA Contusion of right thigh, initial encounter: Secondary | ICD-10-CM | POA: Diagnosis not present

## 2017-01-04 DIAGNOSIS — M79604 Pain in right leg: Secondary | ICD-10-CM | POA: Diagnosis not present

## 2017-01-04 DIAGNOSIS — M79651 Pain in right thigh: Secondary | ICD-10-CM | POA: Diagnosis not present

## 2017-01-28 DIAGNOSIS — E7800 Pure hypercholesterolemia, unspecified: Secondary | ICD-10-CM | POA: Diagnosis not present

## 2017-01-28 DIAGNOSIS — R569 Unspecified convulsions: Secondary | ICD-10-CM | POA: Diagnosis not present

## 2017-01-28 DIAGNOSIS — J45909 Unspecified asthma, uncomplicated: Secondary | ICD-10-CM | POA: Diagnosis not present

## 2017-01-28 DIAGNOSIS — F17200 Nicotine dependence, unspecified, uncomplicated: Secondary | ICD-10-CM | POA: Diagnosis not present

## 2017-01-28 DIAGNOSIS — Z86718 Personal history of other venous thrombosis and embolism: Secondary | ICD-10-CM | POA: Diagnosis not present

## 2017-01-28 DIAGNOSIS — Z79899 Other long term (current) drug therapy: Secondary | ICD-10-CM | POA: Diagnosis not present

## 2017-01-28 DIAGNOSIS — L508 Other urticaria: Secondary | ICD-10-CM | POA: Diagnosis not present

## 2017-03-01 DIAGNOSIS — R300 Dysuria: Secondary | ICD-10-CM | POA: Diagnosis not present

## 2017-03-01 DIAGNOSIS — F17200 Nicotine dependence, unspecified, uncomplicated: Secondary | ICD-10-CM | POA: Diagnosis not present

## 2017-03-01 DIAGNOSIS — M545 Low back pain: Secondary | ICD-10-CM | POA: Diagnosis not present

## 2017-03-01 DIAGNOSIS — Z86718 Personal history of other venous thrombosis and embolism: Secondary | ICD-10-CM | POA: Diagnosis not present

## 2017-03-01 DIAGNOSIS — Z79899 Other long term (current) drug therapy: Secondary | ICD-10-CM | POA: Diagnosis not present

## 2017-03-01 DIAGNOSIS — R569 Unspecified convulsions: Secondary | ICD-10-CM | POA: Diagnosis not present

## 2017-03-01 DIAGNOSIS — R109 Unspecified abdominal pain: Secondary | ICD-10-CM | POA: Diagnosis not present

## 2017-03-01 DIAGNOSIS — E7800 Pure hypercholesterolemia, unspecified: Secondary | ICD-10-CM | POA: Diagnosis not present

## 2017-03-10 DIAGNOSIS — S61431A Puncture wound without foreign body of right hand, initial encounter: Secondary | ICD-10-CM | POA: Diagnosis not present

## 2017-03-10 DIAGNOSIS — Z86718 Personal history of other venous thrombosis and embolism: Secondary | ICD-10-CM | POA: Diagnosis not present

## 2017-03-10 DIAGNOSIS — S6991XA Unspecified injury of right wrist, hand and finger(s), initial encounter: Secondary | ICD-10-CM | POA: Diagnosis not present

## 2017-03-10 DIAGNOSIS — Z23 Encounter for immunization: Secondary | ICD-10-CM | POA: Diagnosis not present

## 2017-03-10 DIAGNOSIS — W268XXA Contact with other sharp object(s), not elsewhere classified, initial encounter: Secondary | ICD-10-CM | POA: Diagnosis not present

## 2017-03-10 DIAGNOSIS — R569 Unspecified convulsions: Secondary | ICD-10-CM | POA: Diagnosis not present

## 2017-03-10 DIAGNOSIS — F17200 Nicotine dependence, unspecified, uncomplicated: Secondary | ICD-10-CM | POA: Diagnosis not present

## 2017-03-10 DIAGNOSIS — Z79899 Other long term (current) drug therapy: Secondary | ICD-10-CM | POA: Diagnosis not present

## 2017-04-22 ENCOUNTER — Other Ambulatory Visit: Payer: Self-pay

## 2017-04-22 ENCOUNTER — Emergency Department (HOSPITAL_COMMUNITY): Payer: Medicare Other

## 2017-04-22 ENCOUNTER — Encounter (HOSPITAL_COMMUNITY): Payer: Self-pay | Admitting: *Deleted

## 2017-04-22 DIAGNOSIS — R079 Chest pain, unspecified: Secondary | ICD-10-CM | POA: Diagnosis not present

## 2017-04-22 DIAGNOSIS — J4530 Mild persistent asthma, uncomplicated: Secondary | ICD-10-CM | POA: Insufficient documentation

## 2017-04-22 DIAGNOSIS — J9801 Acute bronchospasm: Secondary | ICD-10-CM | POA: Diagnosis not present

## 2017-04-22 DIAGNOSIS — R509 Fever, unspecified: Secondary | ICD-10-CM | POA: Diagnosis not present

## 2017-04-22 DIAGNOSIS — F17210 Nicotine dependence, cigarettes, uncomplicated: Secondary | ICD-10-CM | POA: Insufficient documentation

## 2017-04-22 DIAGNOSIS — Z79899 Other long term (current) drug therapy: Secondary | ICD-10-CM | POA: Diagnosis not present

## 2017-04-22 DIAGNOSIS — J209 Acute bronchitis, unspecified: Secondary | ICD-10-CM | POA: Insufficient documentation

## 2017-04-22 DIAGNOSIS — R110 Nausea: Secondary | ICD-10-CM | POA: Diagnosis not present

## 2017-04-22 DIAGNOSIS — J40 Bronchitis, not specified as acute or chronic: Secondary | ICD-10-CM | POA: Diagnosis not present

## 2017-04-22 DIAGNOSIS — R05 Cough: Secondary | ICD-10-CM | POA: Diagnosis present

## 2017-04-22 NOTE — ED Triage Notes (Signed)
Pt c/o cough, chest tightness, chills, nausea that started last night, spouse sick with same symptoms,

## 2017-04-23 ENCOUNTER — Emergency Department (HOSPITAL_COMMUNITY)
Admission: EM | Admit: 2017-04-23 | Discharge: 2017-04-23 | Disposition: A | Discharge status: Home / Self Care | Payer: Medicare Other | Attending: Emergency Medicine | Admitting: Emergency Medicine

## 2017-04-23 DIAGNOSIS — J209 Acute bronchitis, unspecified: Secondary | ICD-10-CM

## 2017-04-23 MED ORDER — ALBUTEROL SULFATE HFA 108 (90 BASE) MCG/ACT IN AERS
2.0000 | INHALATION_SPRAY | Freq: Four times a day (QID) | RESPIRATORY_TRACT | Status: DC | PRN
  Administered 2017-04-23: 2 via RESPIRATORY_TRACT
  Filled 2017-04-23: qty 6.7

## 2017-04-23 MED ORDER — IPRATROPIUM BROMIDE 0.02 % IN SOLN
0.5000 mg | Freq: Once | RESPIRATORY_TRACT | Status: AC
  Administered 2017-04-23: 0.5 mg via RESPIRATORY_TRACT
  Filled 2017-04-23: qty 2.5

## 2017-04-23 MED ORDER — ALBUTEROL SULFATE (2.5 MG/3ML) 0.083% IN NEBU
5.0000 mg | INHALATION_SOLUTION | Freq: Once | RESPIRATORY_TRACT | Status: AC
  Administered 2017-04-23: 5 mg via RESPIRATORY_TRACT
  Filled 2017-04-23: qty 6

## 2017-04-23 MED ORDER — PREDNISONE 20 MG PO TABS: ORAL_TABLET | ORAL | 0 refills | Status: DC

## 2017-04-23 MED ORDER — AZITHROMYCIN 250 MG PO TABS: ORAL_TABLET | ORAL | 0 refills | Status: DC

## 2017-04-23 NOTE — ED Provider Notes (Signed)
[facility] EMERGENCY DEPARTMENT Provider Note   CSN: [id number] Arrival date & time: 04/22/17  2211  Time seen 12:36 AM   History   Chief Complaint Chief Complaint  Patient presents with  . Cough    HPI Lisa Simpson is a 42 y.o. female.  HPI patient states yesterday, March 6 she started having some rhinorrhea and stuffy nose.  She has had a dry cough and a mild sore throat that started today.  She states she had fever to 101.2 about 7 PM and took 3 regular Tylenol.  She states she had chills and nausea without vomiting or diarrhea.  She states her chest hurts when she breathes in and indicates her lower rib cage bilaterally.  She states she has been wheezing and ran out of her inhaler.  She states she did do the flu shot this season.  She states her husband got sick with similar symptoms a few days before her.  PCP [facility]  Past Medical History:  Diagnosis Date  . Asthma   . Borderline hypercholesterolemia   . Family history of anesthesia complication    "dad woke up during surgery"  . Gall stones 2014  . GERD (gastroesophageal reflux disease)   . Headache(784.0)    occasional  . Heart murmur   . Heart palpitations    "feels fluttery"  . History of kidney stones    2 currently  . Mastoiditis of left side 2015  . Pneumonia oct 2014   hx of  . Seizures ([place]) 2013    Patient Active Problem List   Diagnosis Date Noted  . Cholecystitis with cholelithiasis 06/07/2013  . ALLERGIC RHINITIS 09/04/2009  . INSOMNIA 09/04/2009  . IMPAIRED GLUCOSE TOLERANCE 05/29/2009  . TOBACCO ABUSE 05/29/2009  . OTITIS EXTERNA 05/29/2009  . ANXIETY DEPRESSION 05/09/2009  . CARPAL TUNNEL SYNDROME, BILATERAL 05/09/2009  . EXCESSIVE MENSTRUAL BLEEDING 05/09/2009  . ASTHMA, PERSISTENT, MILD 07/30/2008  . OBESITY, UNSPECIFIED 01/27/2008  . GERD 12/20/2007  . PANIC ATTACK 12/19/2007  . URTICARIA, CHRONIC 12/19/2007  . SEIZURE DISORDER 12/19/2007    Past Surgical  History:  Procedure Laterality Date  . CHOLECYSTECTOMY N/A 06/28/2013   Procedure: LAPAROSCOPIC CHOLECYSTECTOMY WITH INTRAOPERATIVE CHOLANGIOGRAM;  Surgeon: Adin Hector, MD;  Location: [facility];  Service: General;  Laterality: N/A;  . MULTIPLE TOOTH EXTRACTIONS    . TONSILLECTOMY     as child    OB History    Gravida Para Term Preterm AB Living   3 3       3    SAB TAB Ectopic Multiple Live Births                   Home Medications    Prior to Admission medications   Medication Sig Start Date End Date Taking? Authorizing Provider  albuterol (PROVENTIL HFA;VENTOLIN HFA) 108 (90 BASE) MCG/ACT inhaler Inhale 4 puffs into the lungs every 6 (six) hours as needed. For ease of Breathing    [provider]  azithromycin (ZITHROMAX Z-PAK) 250 MG tablet Take 2 po the first day then once a day for the next 4 days. 04/23/17   Rolland Porter, MD  esomeprazole (NEXIUM) 40 MG capsule Take 40 mg by mouth daily at 12 noon.    [provider]  ibuprofen (ADVIL,MOTRIN) 800 MG tablet Take 1 tablet (800 mg total) by mouth every 8 (eight) hours as needed for mild pain. Patient not taking: Reported on 02/26/2015 10/11/14   Ward, Delice Bison, DO  phenytoin (DILANTIN) 100 MG ER capsule Take 100 mg by mouth 2 (two) times daily.     [provider]  predniSONE (DELTASONE) 20 MG tablet Take 3 po QD x 3d , then 2 po QD x 3d then 1 po QD x 3d 04/23/17   Rolland Porter, MD    Family History No family history on file.  Social History Social History   Tobacco Use  . Smoking status: Current Every Day Smoker    Packs/day: 0.50    Years: 12.00    Pack years: 6.00    Types: Cigarettes  . Smokeless tobacco: Never Used  Substance Use Topics  . Alcohol use: No  . Drug use: No  lives with spouse Unemployed (but asked for a work note for today)   Allergies   Morphine and related and Vicodin [hydrocodone-acetaminophen]   Review of Systems Review of Systems  All other systems reviewed and  are negative.    Physical Exam Updated Vital Signs BP 127/77   Pulse 84   Temp 97.6 F (36.4 C) (Oral)   Resp 20   Ht 5\' 2"  (1.575 m)   Wt 109.3 kg (241 lb)   LMP 03/25/2017   SpO2 98%   BMI 44.08 kg/m   Physical Exam  Constitutional: She is oriented to person, place, and time. She appears well-developed and well-nourished.  Non-toxic appearance. She does not appear ill. No distress.  HENT:  Head: Normocephalic and atraumatic.  Right Ear: External ear normal.  Left Ear: External ear normal.  Nose: Nose normal. No mucosal edema or rhinorrhea.  Mouth/Throat: Oropharynx is clear and moist and mucous membranes are normal. No dental abscesses or uvula swelling.  Eyes: Conjunctivae and EOM are normal. Pupils are equal, round, and reactive to light.  Neck: Normal range of motion and full passive range of motion without pain. Neck supple.  Cardiovascular: Normal rate, regular rhythm and normal heart sounds. Exam reveals no gallop and no friction rub.  No murmur heard. Pulmonary/Chest: Effort normal. No respiratory distress. She has decreased breath sounds. She has wheezes. She has no rhonchi. She has no rales. She exhibits no tenderness and no crepitus.  Rare scattered wheezing  Abdominal: Soft. Normal appearance and bowel sounds are normal. She exhibits no distension. There is no tenderness. There is no rebound and no guarding.  Musculoskeletal: Normal range of motion. She exhibits no edema or tenderness.  Moves all extremities well.   Neurological: She is alert and oriented to person, place, and time. She has normal strength. No cranial nerve deficit.  Skin: Skin is warm, dry and intact. No rash noted. No erythema. No pallor.  Psychiatric: She has a normal mood and affect. Her speech is normal and behavior is normal. Her mood appears not anxious.  Nursing note and vitals reviewed.    ED Treatments / Results  Labs (all labs ordered are listed, but only abnormal results are  displayed) Labs Reviewed - No data to display  EKG  EKG Interpretation  Date/Time:  Thursday April 22 2017 22:29:34 EST Ventricular Rate:  82 PR Interval:  170 QRS Duration: 102 QT Interval:  394 QTC Calculation: 460 R Axis:   41 Text Interpretation:  Normal sinus rhythm with sinus arrhythmia Incomplete right bundle branch block No significant change since last tracing 27 Jun 2013 Confirmed by Rolland Porter [phone]) on 04/22/2017 11:14:31 PM       Radiology Dg Chest 2 View  Result Date: 04/22/2017 CLINICAL DATA:  Chest pain EXAM:  CHEST - 2 VIEW COMPARISON:  12/03/2015 FINDINGS: Hyperinflation. Diffuse interstitial opacity consistent with bronchial inflammation. No focal consolidation or effusion. Normal heart size. No pneumothorax. Surgical clips in the upper abdomen IMPRESSION: Diffuse increased interstitial opacity which may be secondary to reactive airways or bronchial inflammation. No focal pulmonary infiltrate is seen. Electronically Signed   By: Donavan Foil M.D.   On: 04/22/2017 22:50    Procedures Procedures (including critical care time)  Medications Ordered in ED Medications  albuterol (PROVENTIL HFA;VENTOLIN HFA) 108 (90 Base) MCG/ACT inhaler 2 puff (not administered)  albuterol (PROVENTIL) (2.5 MG/3ML) 0.083% nebulizer solution 5 mg (5 mg Nebulization Given 04/23/17 0058)  ipratropium (ATROVENT) nebulizer solution 0.5 mg (0.5 mg Nebulization Given 04/23/17 0058)     Initial Impression / Assessment and Plan / ED Course  I have reviewed the triage vital signs and the nursing notes.  Pertinent labs & imaging results that were available during my care of the patient were reviewed by me and considered in my medical decision making (see chart for details).     Patient received an albuterol and Atrovent nebulizer treatment.  We discussed her chest x-ray results.  Recheck at 2:30 AM patient states she feels better.  When I listen to her she has improved air movement no more  wheezing.  She states she already has a spacer at home.  She was given a albuterol inhaler to use.  Final Clinical Impressions(s) / ED Diagnoses   Final diagnoses:  Bronchitis with bronchospasm    ED Discharge Orders        Ordered    predniSONE (DELTASONE) 20 MG tablet     04/23/17 0338    azithromycin (ZITHROMAX Z-PAK) 250 MG tablet     04/23/17 1607      Plan discharge  Rolland Porter, MD, Barbette Or, MD 04/23/17 [phone]

## 2017-04-23 NOTE — Discharge Instructions (Addendum)
Drink plenty of fluids, take the medications as prescribed.  Use the inhaler 2 puffs every 4-6 hours as needed for wheezing.  Continue Motrin or Tylenol as needed for fever.  Recheck if you struggle to breathe despite using your inhaler, you feel like you are dehydrated or you get worse.

## 2017-05-05 DIAGNOSIS — R569 Unspecified convulsions: Secondary | ICD-10-CM | POA: Diagnosis not present

## 2017-05-05 DIAGNOSIS — H7091 Unspecified mastoiditis, right ear: Secondary | ICD-10-CM | POA: Diagnosis not present

## 2017-05-05 DIAGNOSIS — F17200 Nicotine dependence, unspecified, uncomplicated: Secondary | ICD-10-CM | POA: Diagnosis not present

## 2017-05-05 DIAGNOSIS — H6691 Otitis media, unspecified, right ear: Secondary | ICD-10-CM | POA: Diagnosis not present

## 2017-05-05 DIAGNOSIS — Z720 Tobacco use: Secondary | ICD-10-CM | POA: Diagnosis not present

## 2017-05-05 DIAGNOSIS — Z79899 Other long term (current) drug therapy: Secondary | ICD-10-CM | POA: Diagnosis not present

## 2017-05-05 DIAGNOSIS — Z86718 Personal history of other venous thrombosis and embolism: Secondary | ICD-10-CM | POA: Diagnosis not present

## 2017-05-05 DIAGNOSIS — H6091 Unspecified otitis externa, right ear: Secondary | ICD-10-CM | POA: Diagnosis not present

## 2017-05-06 ENCOUNTER — Ambulatory Visit
Admission: RE | Admit: 2017-05-06 | Discharge: 2017-05-06 | Disposition: A | Discharge status: Home / Self Care | Payer: Medicare Other | Source: Ambulatory Visit | Attending: Otolaryngology | Admitting: Otolaryngology

## 2017-05-06 ENCOUNTER — Other Ambulatory Visit: Payer: Self-pay | Admitting: Otolaryngology

## 2017-05-06 DIAGNOSIS — Z86718 Personal history of other venous thrombosis and embolism: Secondary | ICD-10-CM

## 2017-05-06 DIAGNOSIS — H9201 Otalgia, right ear: Secondary | ICD-10-CM | POA: Insufficient documentation

## 2017-05-06 DIAGNOSIS — M542 Cervicalgia: Secondary | ICD-10-CM

## 2017-05-06 MED ORDER — IOPAMIDOL (ISOVUE-300) INJECTION 61%
75.0000 mL | Freq: Once | INTRAVENOUS | Status: AC | PRN
  Administered 2017-05-06: 75 mL via INTRAVENOUS

## 2017-08-16 DIAGNOSIS — J45909 Unspecified asthma, uncomplicated: Secondary | ICD-10-CM | POA: Diagnosis not present

## 2017-08-16 DIAGNOSIS — Z79899 Other long term (current) drug therapy: Secondary | ICD-10-CM | POA: Diagnosis not present

## 2017-08-16 DIAGNOSIS — R0789 Other chest pain: Secondary | ICD-10-CM | POA: Diagnosis not present

## 2017-08-16 DIAGNOSIS — R0602 Shortness of breath: Secondary | ICD-10-CM | POA: Diagnosis not present

## 2017-08-16 DIAGNOSIS — L509 Urticaria, unspecified: Secondary | ICD-10-CM | POA: Diagnosis not present

## 2017-08-16 DIAGNOSIS — R569 Unspecified convulsions: Secondary | ICD-10-CM | POA: Diagnosis not present

## 2017-08-16 DIAGNOSIS — E7800 Pure hypercholesterolemia, unspecified: Secondary | ICD-10-CM | POA: Diagnosis not present

## 2017-08-16 DIAGNOSIS — F17200 Nicotine dependence, unspecified, uncomplicated: Secondary | ICD-10-CM | POA: Diagnosis not present

## 2017-08-16 DIAGNOSIS — Z86718 Personal history of other venous thrombosis and embolism: Secondary | ICD-10-CM | POA: Diagnosis not present

## 2017-08-17 DIAGNOSIS — L509 Urticaria, unspecified: Secondary | ICD-10-CM | POA: Diagnosis not present

## 2017-08-30 DIAGNOSIS — Z720 Tobacco use: Secondary | ICD-10-CM | POA: Diagnosis not present

## 2017-08-30 DIAGNOSIS — Z0000 Encounter for general adult medical examination without abnormal findings: Secondary | ICD-10-CM | POA: Diagnosis not present

## 2017-08-30 DIAGNOSIS — Z13220 Encounter for screening for lipoid disorders: Secondary | ICD-10-CM | POA: Diagnosis not present

## 2017-08-30 DIAGNOSIS — Z130 Encounter for screening for diseases of the blood and blood-forming organs and certain disorders involving the immune mechanism: Secondary | ICD-10-CM | POA: Diagnosis not present

## 2017-08-30 DIAGNOSIS — Z13228 Encounter for screening for other metabolic disorders: Secondary | ICD-10-CM | POA: Diagnosis not present

## 2017-08-30 DIAGNOSIS — R569 Unspecified convulsions: Secondary | ICD-10-CM | POA: Diagnosis not present

## 2017-09-01 DIAGNOSIS — S61211A Laceration without foreign body of left index finger without damage to nail, initial encounter: Secondary | ICD-10-CM | POA: Insufficient documentation

## 2017-09-08 DIAGNOSIS — E7800 Pure hypercholesterolemia, unspecified: Secondary | ICD-10-CM | POA: Diagnosis not present

## 2017-09-08 DIAGNOSIS — Z86718 Personal history of other venous thrombosis and embolism: Secondary | ICD-10-CM | POA: Diagnosis not present

## 2017-09-08 DIAGNOSIS — R0781 Pleurodynia: Secondary | ICD-10-CM | POA: Diagnosis not present

## 2017-09-08 DIAGNOSIS — R05 Cough: Secondary | ICD-10-CM | POA: Diagnosis not present

## 2017-09-08 DIAGNOSIS — R0989 Other specified symptoms and signs involving the circulatory and respiratory systems: Secondary | ICD-10-CM | POA: Diagnosis not present

## 2017-09-08 DIAGNOSIS — R569 Unspecified convulsions: Secondary | ICD-10-CM | POA: Diagnosis not present

## 2017-09-08 DIAGNOSIS — F17200 Nicotine dependence, unspecified, uncomplicated: Secondary | ICD-10-CM | POA: Diagnosis not present

## 2017-09-08 DIAGNOSIS — J45909 Unspecified asthma, uncomplicated: Secondary | ICD-10-CM | POA: Diagnosis not present

## 2017-09-08 DIAGNOSIS — Z79899 Other long term (current) drug therapy: Secondary | ICD-10-CM | POA: Diagnosis not present

## 2017-10-11 ENCOUNTER — Encounter (HOSPITAL_COMMUNITY): Payer: Self-pay

## 2017-10-11 ENCOUNTER — Emergency Department (HOSPITAL_COMMUNITY): Payer: Medicare Other

## 2017-10-11 ENCOUNTER — Other Ambulatory Visit: Payer: Self-pay

## 2017-10-11 ENCOUNTER — Emergency Department (HOSPITAL_COMMUNITY)
Admission: EM | Admit: 2017-10-11 | Discharge: 2017-10-11 | Disposition: A | Discharge status: Home / Self Care | Payer: Medicare Other | Attending: Emergency Medicine | Admitting: Emergency Medicine

## 2017-10-11 DIAGNOSIS — R0789 Other chest pain: Secondary | ICD-10-CM | POA: Diagnosis not present

## 2017-10-11 DIAGNOSIS — R079 Chest pain, unspecified: Secondary | ICD-10-CM

## 2017-10-11 DIAGNOSIS — Z79899 Other long term (current) drug therapy: Secondary | ICD-10-CM | POA: Diagnosis not present

## 2017-10-11 DIAGNOSIS — F17210 Nicotine dependence, cigarettes, uncomplicated: Secondary | ICD-10-CM | POA: Insufficient documentation

## 2017-10-11 DIAGNOSIS — W19XXXA Unspecified fall, initial encounter: Secondary | ICD-10-CM | POA: Diagnosis not present

## 2017-10-11 DIAGNOSIS — J45909 Unspecified asthma, uncomplicated: Secondary | ICD-10-CM | POA: Insufficient documentation

## 2017-10-11 DIAGNOSIS — I959 Hypotension, unspecified: Secondary | ICD-10-CM | POA: Diagnosis not present

## 2017-10-11 DIAGNOSIS — R0689 Other abnormalities of breathing: Secondary | ICD-10-CM | POA: Diagnosis not present

## 2017-10-11 LAB — CBC WITH DIFFERENTIAL/PLATELET
BASOS ABS: 0 10*3/uL (ref 0.0–0.1)
BASOS PCT: 0 %
EOS PCT: 2 %
Eosinophils Absolute: 0.3 10*3/uL (ref 0.0–0.7)
HEMATOCRIT: 40.2 % (ref 36.0–46.0)
Hemoglobin: 13.3 g/dL (ref 12.0–15.0)
Lymphocytes Relative: 31 %
Lymphs Abs: 4 10*3/uL (ref 0.7–4.0)
MCH: 27.8 pg (ref 26.0–34.0)
MCHC: 33.1 g/dL (ref 30.0–36.0)
MCV: 84.1 fL (ref 78.0–100.0)
MONO ABS: 0.6 10*3/uL (ref 0.1–1.0)
Monocytes Relative: 4 %
NEUTROS ABS: 8.3 10*3/uL — AB (ref 1.7–7.7)
Neutrophils Relative %: 63 %
PLATELETS: 315 10*3/uL (ref 150–400)
RBC: 4.78 MIL/uL (ref 3.87–5.11)
RDW: 14.7 % (ref 11.5–15.5)
WBC: 13.1 10*3/uL — AB (ref 4.0–10.5)

## 2017-10-11 LAB — BASIC METABOLIC PANEL
ANION GAP: 10 (ref 5–15)
BUN: 13 mg/dL (ref 6–20)
CHLORIDE: 107 mmol/L (ref 98–111)
CO2: 23 mmol/L (ref 22–32)
CREATININE: 0.75 mg/dL (ref 0.44–1.00)
Calcium: 9.1 mg/dL (ref 8.9–10.3)
GFR calc non Af Amer: >60 mL/min (ref >60)
Glucose, Bld: 111 mg/dL — ABNORMAL HIGH (ref 70–99)
Potassium: 3.7 mmol/L (ref 3.5–5.1)
Sodium: 140 mmol/L (ref 135–145)

## 2017-10-11 LAB — POC URINE PREG, ED: PREG TEST UR: NEGATIVE

## 2017-10-11 LAB — TROPONIN I: Troponin I: <0.03 ng/mL (ref <0.03)

## 2017-10-11 LAB — D-DIMER, QUANTITATIVE: D-Dimer, Quant: <0.27 ug/mL-FEU (ref 0.00–0.50)

## 2017-10-11 NOTE — ED Triage Notes (Signed)
EMS called out for chest pain. Pt reports pain started about 11 pm last night and has gotten worse. Pain is worse with movement and palpation. Location right upper chest and shoulder radiating down right arm with tingling to hand. No other symptoms reported. Pt had 6 baby ASA and one nitro prior to arrival without relief.

## 2017-10-11 NOTE — Discharge Instructions (Addendum)
Take acetaminophen and/or ibuprofen as needed for pain.   Return if symptoms are getting worse.

## 2017-10-11 NOTE — ED Provider Notes (Signed)
[facility] EMERGENCY DEPARTMENT Provider Note   CSN: [id number] Arrival date & time: 10/11/17  0341     History   Chief Complaint Chief Complaint  Patient presents with  . Chest Pain    HPI Lisa Simpson is a 42 y.o. female.  The history is provided by the patient.  She has a history of hyperlipidemia, seizures, DVT and comes in with sharp right-sided chest pain which radiates down her right arm.  Pain comes and goes without any particular pattern.  Nothing makes it better, nothing makes it worse.  She rated pain at 6/10.  She denies dyspnea, nausea, diaphoresis.  She took aspirin 162 mg without relief.  She then called 911 and was advised to take an additional 364 mg of aspirin chewed.  Following this, EMS transported her to the ED.  She is a cigarette smoker.  She denies history of hypertension or diabetes.  There is no family history of premature coronary atherosclerosis.  She denies exogenous estrogen use, recent surgery, recent travel.  She cannot recall if her episode of DVT was considered to be provoked.  Past Medical History:  Diagnosis Date  . Asthma   . Borderline hypercholesterolemia   . Family history of anesthesia complication    "dad woke up during surgery"  . Gall stones 2014  . GERD (gastroesophageal reflux disease)   . Headache(784.0)    occasional  . Heart murmur   . Heart palpitations    "feels fluttery"  . History of kidney stones    2 currently  . Mastoiditis of left side 2015  . Pneumonia oct 2014   hx of  . Seizures ([place]) 2013    Patient Active Problem List   Diagnosis Date Noted  . Cholecystitis with cholelithiasis 06/07/2013  . ALLERGIC RHINITIS 09/04/2009  . INSOMNIA 09/04/2009  . IMPAIRED GLUCOSE TOLERANCE 05/29/2009  . TOBACCO ABUSE 05/29/2009  . OTITIS EXTERNA 05/29/2009  . ANXIETY DEPRESSION 05/09/2009  . CARPAL TUNNEL SYNDROME, BILATERAL 05/09/2009  . EXCESSIVE MENSTRUAL BLEEDING 05/09/2009  . ASTHMA, PERSISTENT, MILD 07/30/2008   . OBESITY, UNSPECIFIED 01/27/2008  . GERD 12/20/2007  . PANIC ATTACK 12/19/2007  . URTICARIA, CHRONIC 12/19/2007  . SEIZURE DISORDER 12/19/2007    Past Surgical History:  Procedure Laterality Date  . CHOLECYSTECTOMY N/A 06/28/2013   Procedure: LAPAROSCOPIC CHOLECYSTECTOMY WITH INTRAOPERATIVE CHOLANGIOGRAM;  Surgeon: Adin Hector, MD;  Location: [facility];  Service: General;  Laterality: N/A;  . MULTIPLE TOOTH EXTRACTIONS    . TONSILLECTOMY     as child     OB History    Gravida  3   Para  3   Term      Preterm      AB      Living  3     SAB      TAB      Ectopic      Multiple      Live Births               Home Medications    Prior to Admission medications   Medication Sig Start Date End Date Taking? Authorizing Provider  hydrOXYzine (VISTARIL) 25 MG capsule Take 25 mg by mouth 3 (three) times daily as needed for itching.   Yes [provider]  albuterol (PROVENTIL HFA;VENTOLIN HFA) 108 (90 BASE) MCG/ACT inhaler Inhale 4 puffs into the lungs every 6 (six) hours as needed. For ease of Breathing    [provider]  azithromycin (ZITHROMAX Z-PAK) 250  MG tablet Take 2 po the first day then once a day for the next 4 days. 04/23/17   Rolland Porter, MD  esomeprazole (NEXIUM) 40 MG capsule Take 40 mg by mouth daily at 12 noon.    [provider]  ibuprofen (ADVIL,MOTRIN) 800 MG tablet Take 1 tablet (800 mg total) by mouth every 8 (eight) hours as needed for mild pain. Patient not taking: Reported on 02/26/2015 10/11/14   Ward, Delice Bison, DO  phenytoin (DILANTIN) 100 MG ER capsule Take 100 mg by mouth 2 (two) times daily.     [provider]  predniSONE (DELTASONE) 20 MG tablet Take 3 po QD x 3d , then 2 po QD x 3d then 1 po QD x 3d 04/23/17   Rolland Porter, MD    Family History No family history on file.  Social History Social History   Tobacco Use  . Smoking status: Current Every Day Smoker    Packs/day: 0.50    Years: 12.00     Pack years: 6.00    Types: Cigarettes  . Smokeless tobacco: Never Used  Substance Use Topics  . Alcohol use: No  . Drug use: No     Allergies   Morphine and related and Vicodin [hydrocodone-acetaminophen]   Review of Systems Review of Systems  All other systems reviewed and are negative.    Physical Exam Updated Vital Signs BP 97/80 (BP Location: Left Arm)   Pulse 64   Temp 97.6 F (36.4 C) (Oral)   Resp 14   Ht 5\' 2"  (1.575 m)   Wt 100.2 kg   LMP 10/04/2017 (Approximate)   SpO2 98%   BMI 40.42 kg/m   Physical Exam  Nursing note and vitals reviewed.  42 year old female, resting comfortably and in no acute distress. Vital signs are normal. Oxygen saturation is 98%, which is normal. Head is normocephalic and atraumatic. PERRLA, EOMI. Oropharynx is clear. Neck is nontender and supple without adenopathy or JVD. Back is nontender and there is no CVA tenderness. Lungs are clear without rales, wheezes, or rhonchi. Chest is nontender. Heart has regular rate and rhythm without murmur. Abdomen is soft, flat, nontender without masses or hepatosplenomegaly and peristalsis is normoactive. Extremities have no cyanosis or edema, full range of motion is present. Skin is warm and dry without rash. Neurologic: Mental status is normal, cranial nerves are intact, there are no motor or sensory deficits.  ED Treatments / Results  Labs (all labs ordered are listed, but only abnormal results are displayed) Labs Reviewed  BASIC METABOLIC PANEL - Abnormal; Notable for the following components:      Result Value   Glucose, Bld 111 (*)    All other components within normal limits  CBC WITH DIFFERENTIAL/PLATELET - Abnormal; Notable for the following components:   WBC 13.1 (*)    Neutro Abs 8.3 (*)    All other components within normal limits  TROPONIN I  D-DIMER, QUANTITATIVE (NOT AT [facility])  POC URINE PREG, ED    EKG EKG Interpretation  Date/Time:  Monday October 11 2017 04:00:41  EDT Ventricular Rate:  64 PR Interval:    QRS Duration: 108 QT Interval:  430 QTC Calculation: 444 R Axis:   49 Text Interpretation:  Sinus rhythm RSR' in V1 or V2, right VCD or RVH When compared with ECG of 04/22/2017, No significant change was found Confirmed by Delora Fuel ([id number]) on 10/11/2017 4:12:11 AM   Radiology Dg Chest 2 View  Result Date: 10/11/2017  CLINICAL DATA:  42 y/o  F; worsening chest pain. EXAM: CHEST - 2 VIEW COMPARISON:  09/08/2017 chest radiograph FINDINGS: Stable heart size and mediastinal contours are within normal limits. Both lungs are clear. The visualized skeletal structures are unremarkable. Right upper quadrant cholecystectomy clips. IMPRESSION: No acute pulmonary process identified. Electronically Signed   By: Kristine Garbe M.D.   On: 10/11/2017 05:20    Procedures Procedures   Medications Ordered in ED Medications - No data to display   Initial Impression / Assessment and Plan / ED Course  I have reviewed the triage vital signs and the nursing notes.  Pertinent labs & imaging results that were available during my care of the patient were reviewed by me and considered in my medical decision making (see chart for details).  Chest pain which seems quite atypical for coronary artery disease.  However, with history of DVT, pulmonary embolism cannot be ruled out by [facility] or [facility] criteria.  D-dimer is ordered.  ECG shows no acute changes.  Chest x-ray is unremarkable.  Old records are reviewed, and she has no relevant past visits.  Dimer has come back normal.  She has not had any further chest pains while in the ED.  She is felt to be safe for discharge at this point.  Advised to use over-the-counter analgesics as needed for pain, return should symptoms worsen.  Final Clinical Impressions(s) / ED Diagnoses   Final diagnoses:  Nonspecific chest pain    ED Discharge Orders    None       Delora Fuel, MD 75/10/25 [phone]

## 2017-10-11 NOTE — ED Notes (Signed)
Pt asleep.

## 2017-10-26 ENCOUNTER — Ambulatory Visit: Payer: Medicare Other | Admitting: Allergy & Immunology

## 2018-01-18 DIAGNOSIS — Z23 Encounter for immunization: Secondary | ICD-10-CM | POA: Diagnosis not present

## 2018-02-14 DIAGNOSIS — F17200 Nicotine dependence, unspecified, uncomplicated: Secondary | ICD-10-CM | POA: Diagnosis not present

## 2018-02-14 DIAGNOSIS — R569 Unspecified convulsions: Secondary | ICD-10-CM | POA: Diagnosis not present

## 2018-02-14 DIAGNOSIS — R4020 Unspecified coma: Secondary | ICD-10-CM | POA: Diagnosis not present

## 2018-02-14 DIAGNOSIS — Z79899 Other long term (current) drug therapy: Secondary | ICD-10-CM | POA: Diagnosis not present

## 2018-03-04 DIAGNOSIS — R05 Cough: Secondary | ICD-10-CM | POA: Diagnosis not present

## 2018-03-04 DIAGNOSIS — J0140 Acute pansinusitis, unspecified: Secondary | ICD-10-CM | POA: Diagnosis not present

## 2018-03-04 DIAGNOSIS — R0981 Nasal congestion: Secondary | ICD-10-CM | POA: Diagnosis not present

## 2018-04-01 DIAGNOSIS — Z23 Encounter for immunization: Secondary | ICD-10-CM | POA: Diagnosis not present

## 2018-04-01 DIAGNOSIS — R569 Unspecified convulsions: Secondary | ICD-10-CM | POA: Diagnosis not present

## 2018-04-01 DIAGNOSIS — E782 Mixed hyperlipidemia: Secondary | ICD-10-CM | POA: Diagnosis not present

## 2018-04-01 DIAGNOSIS — J4520 Mild intermittent asthma, uncomplicated: Secondary | ICD-10-CM | POA: Diagnosis not present

## 2018-04-20 ENCOUNTER — Encounter: Payer: Self-pay | Admitting: Neurology

## 2018-05-31 DIAGNOSIS — J45909 Unspecified asthma, uncomplicated: Secondary | ICD-10-CM | POA: Diagnosis not present

## 2018-05-31 DIAGNOSIS — R569 Unspecified convulsions: Secondary | ICD-10-CM | POA: Diagnosis not present

## 2018-06-30 DIAGNOSIS — J4540 Moderate persistent asthma, uncomplicated: Secondary | ICD-10-CM | POA: Insufficient documentation

## 2018-06-30 DIAGNOSIS — J4520 Mild intermittent asthma, uncomplicated: Secondary | ICD-10-CM | POA: Insufficient documentation

## 2018-07-04 ENCOUNTER — Other Ambulatory Visit: Payer: Self-pay

## 2018-07-04 ENCOUNTER — Telehealth: Payer: Medicare HMO | Admitting: Neurology

## 2018-07-12 DIAGNOSIS — R05 Cough: Secondary | ICD-10-CM | POA: Diagnosis not present

## 2018-07-12 DIAGNOSIS — J029 Acute pharyngitis, unspecified: Secondary | ICD-10-CM | POA: Diagnosis not present

## 2018-07-12 DIAGNOSIS — R0981 Nasal congestion: Secondary | ICD-10-CM | POA: Diagnosis not present

## 2018-07-12 DIAGNOSIS — R0789 Other chest pain: Secondary | ICD-10-CM | POA: Diagnosis not present

## 2018-11-02 ENCOUNTER — Encounter: Payer: Medicare HMO | Admitting: Advanced Practice Midwife

## 2018-11-10 ENCOUNTER — Telehealth: Payer: Self-pay | Admitting: Obstetrics and Gynecology

## 2018-11-10 NOTE — Telephone Encounter (Signed)
Called patient regarding appointment and the following message was left:   We have you scheduled for an upcoming appointment at our office. At this time, patients are encouraged to come alone to their visits whenever possible, however, a support person, over age 41, may accompany you to your appointment if assistance is needed for safety or care concerns. Otherwise, support persons should remain outside until the visit is complete.   We ask if you have had any exposure to anyone suspected or confirmed of having COVID-19 or if you are experiencing any of the following, to call and reschedule your appointment: fever, cough, shortness of breath, muscle pain, diarrhea, rash, vomiting, abdominal pain, red eye, weakness, bruising, bleeding, joint pain, or a severe headache.   Please know we will ask you these questions or similar questions when you arrive for your appointment and again its how we are keeping everyone safe.    Also,to keep you safe, please use the provided hand sanitizer when you enter the office. We are asking everyone in the office to wear a mask to help prevent the spread of germs. If you have a mask of your own, please wear it to your appointment, if not, we are happy to provide one for you.  Thank you for understanding and your cooperation.    [facility] Staff

## 2018-11-11 ENCOUNTER — Other Ambulatory Visit: Payer: Self-pay

## 2018-11-11 ENCOUNTER — Ambulatory Visit (INDEPENDENT_AMBULATORY_CARE_PROVIDER_SITE_OTHER): Payer: Medicare HMO | Admitting: Obstetrics and Gynecology

## 2018-11-11 VITALS — BP 122/79 | HR 81 | Ht 61.5 in | Wt 244.4 lb

## 2018-11-11 DIAGNOSIS — Z3202 Encounter for pregnancy test, result negative: Secondary | ICD-10-CM

## 2018-11-11 DIAGNOSIS — N939 Abnormal uterine and vaginal bleeding, unspecified: Secondary | ICD-10-CM | POA: Diagnosis not present

## 2018-11-11 DIAGNOSIS — D250 Submucous leiomyoma of uterus: Secondary | ICD-10-CM

## 2018-11-11 LAB — POCT URINE PREGNANCY: Preg Test, Ur: NEGATIVE

## 2018-11-11 LAB — POCT HEMOGLOBIN: Hemoglobin: 14.5 g/dL (ref 11–14.6)

## 2018-11-11 NOTE — Progress Notes (Signed)
Patient ID: Avelina Laine, female   DOB: [birth year], 43 y.o.   MRN: [id number]    [facility] Visit  @DATE @            Patient name: Lisa Simpson MRN [id number]  Date of birth: 10-04-1975  CC & HPI:  Lisa Simpson is a 43 y.o. female new gyn presenting today for AUB. Smokes about half a pack a day. July 1 -aug 26 had period for 45. Period started sep 10 to present has been bleeding ever since. Has light bleeding today, says periods las dark blood and a day or later bright red. Is certain she has fibroids, has been gaining weigh constantly with little to no appetite. Last pap 3 yr ago.  ROS:  ROS +AUB  +fibroid -fever -anemia -chills  Pertinent History Reviewed:   Reviewed: Significant for  Medical         Past Medical History:  Diagnosis Date  . Asthma   . Borderline hypercholesterolemia   . Family history of anesthesia complication    "dad woke up during surgery"  . Gall stones 2014  . GERD (gastroesophageal reflux disease)   . Headache(784.0)    occasional  . Heart murmur   . Heart palpitations    "feels fluttery"  . History of kidney stones    2 currently  . Mastoiditis of left side 2015  . Pneumonia oct 2014   hx of  . Seizures ([place]) 2013                              Surgical Hx:    Past Surgical History:  Procedure Laterality Date  . CHOLECYSTECTOMY N/A 06/28/2013   Procedure: LAPAROSCOPIC CHOLECYSTECTOMY WITH INTRAOPERATIVE CHOLANGIOGRAM;  Surgeon: Adin Hector, MD;  Location: [facility];  Service: General;  Laterality: N/A;  . MULTIPLE TOOTH EXTRACTIONS    . TONSILLECTOMY     as child   Medications: Reviewed & Updated - see associated section                       Current Outpatient Medications:  .  albuterol (PROVENTIL HFA;VENTOLIN HFA) 108 (90 BASE) MCG/ACT inhaler, Inhale 4 puffs into the lungs every 6 (six) hours as needed. For ease of Breathing, Disp: , Rfl:  .  esomeprazole (NEXIUM) 40 MG capsule, Take 40 mg by mouth daily at 12 noon.,  Disp: , Rfl:  .  hydrOXYzine (ATARAX/VISTARIL) 25 MG tablet, Take by mouth., Disp: , Rfl:  .  omeprazole (PRILOSEC) 40 MG capsule, Take by mouth., Disp: , Rfl:  .  phenytoin (DILANTIN) 100 MG ER capsule, Take 100 mg by mouth 2 (two) times daily. , Disp: , Rfl:    Social History: Reviewed -  reports that she has been smoking cigarettes. She has a 6.00 pack-year smoking history. She has never used smokeless tobacco.  Objective Findings:  Vitals: There were no vitals taken for this visit.  PHYSICAL EXAMINATION General appearance - alert, well appearing, and in no distress Mental status - alert, oriented to person, place, and time, normal mood, behavior, speech, dress, motor activity, and thought processes, affect appropriate to mood  PELVIC Vagina - light red blood, tender upon exam Cervix - prolapsing fibroid through cervix Uterus - uterine fibroid   Assessment & Plan:   A:  1. AUB 2. Prolapsing fibroid from cervix.  P:  1. Tv u/s next week 2.  F/u 1 week w/ Dr. Elonda Husky 3. Probable hysteroscopy resection of fibroid.    By signing my name below, I, Samul Dada, attest that this documentation has been prepared under the direction and in the presence of Jonnie Kind, MD. Electronically Signed: [place]. 11/11/18. 12:14 PM.  I personally performed the services described in this documentation, which was SCRIBED in my presence. The recorded information has been reviewed and considered accurate. It has been edited as necessary during review. Jonnie Kind, MD

## 2018-11-11 NOTE — Patient Instructions (Addendum)
Uterine Fibroids  Uterine fibroids are lumps of tissue (tumors) in your womb (uterus). They are not cancer (are benign). Most women with this condition do not need treatment. Sometimes fibroids can affect your ability to have children (your fertility). If that happens, you may need surgery to take out the fibroids. Follow these instructions at home:  Take over-the-counter and prescription medicines only as told by your doctor. Your doctor may suggest NSAIDs (such as aspirin or ibuprofen) to help with pain.  Ask your doctor if you should: ? Take iron pills. ? Eat more foods that have iron in them, such as dark green, leafy vegetables.  If directed, apply heat to your back or belly to reduce pain. Use the heat source that your doctor recommends, such as a moist heat pack or a heating pad. ? Put a towel between your skin and the heat source. ? Leave the heat on for 20-30 minutes. ? Remove the heat if your skin turns bright red. This is especially important if you are unable to feel pain, heat, or cold. You may have a greater risk of getting burned.  Pay close attention to your period (menstrual) cycles. Tell your doctor about any changes, such as: ? A heavier blood flow than usual. ? Needing to use more pads or tampons than normal. ? A change in how many days your period lasts. ? A change in symptoms that come with your period, such as cramps or back pain.  Keep all follow-up visits as told by your doctor. This is important. Your doctor may need to watch your fibroids over time for any changes. Contact a doctor if you:  Have pain that does not get better with medicine or heat, such as pain or cramps in: ? Your back. ? The area between your hip bones (pelvic area). ? Your belly.  Have new bleeding between your periods.  Have more bleeding during or between your periods.  Feel very tired or weak.  Feel light-headed. Get help right away if you:  Pass out (faint).  Have pain in the  area between your hip bones that suddenly gets worse.  Have bleeding that soaks a tampon or pad in 30 minutes or less. Summary  Uterine fibroids are lumps of tissue (tumors) in your womb (uterus). They are not cancer.  The only treatment that most women need is taking aspirin or ibuprofen for pain.  Contact a doctor if you have pain or cramps that do not get better with medicine.  Make sure you know what symptoms you should get help for right away. This information is not intended to replace advice given to you by your health care provider. Make sure you discuss any questions you have with your health care provider. Document Released: 03/07/2010 Document Revised: 01/15/2017 Document Reviewed: 12/29/2016 Elsevier Patient Education  2020 [employer].  Discussion: Discussed with pt weight loss methods including food measurement and calorie counting using apps such as MyNetDiary or My Fitness Pal. Recommended the use of measuring cups and spoons to monitor serving sizes. Encouraged adequate daily water intake, especially prior to meals to eliminate overeating. Additionally encouraged pt to become more active by taking daily walks of at least 30 minute duration, join a local gym such as the [facility] or attend water aerobics classes. Also advised pt to use pedometer on smartphone or utilize a smartband such as FitBit to keep track of daily activity.   At end of discussion, pt had opportunity to ask questions and has  no further questions at this time.   Specific discussion of lifestyle changes, behavioral modifications and healthy eating habits as noted above. Greater than 50% was spent in counseling and coordination of care with the patient.  Total time greater than: 15 minutes.   ITrackBites app for weight loss management  Patient Education   Weight Loss Diet  About this topic  There are many "trendy" weight loss diets that are popular today. Many of these diets can end up being more harmful  than helpful. The healthiest way to lose weight is to burn more calories than you eat.  A weight loss diet should help you have a healthy view of eating. It is NOT healthy to stop eating to try and lose weight. A good diet plan will help you cut down your food intake and make healthy choices.  A healthy weight loss goal is 1 to 2 pounds (0.5 to 1 kg) per week. It takes 3500 calories to lose 1 pound (0.5 kg). That means cutting out 500 calories every day for 7 days. You can lose these 500 calories per day by eating fewer calories, burning them through exercise, or both.  It may be easier than you think to cut 500 calories from your daily intake. You can:   Switch from whole milk to 1% or skim milk   Switch from regular cheese to fat-free cheese   Use healthier condiment choices  ? Fat-free sour cream or salad dressings  ? Spray butter  ? Diet syrups or jellies over regular   Try frozen yogurt as a dessert rather than eating ice cream.   Skip the chips. Snack on carrots, vegetables, or fruit. If chips are a favorite of yours, try the baked style.   Eat boiled, seared fish or skinless chicken rather than red meat.   Try flavored no-calorie waters. Do not drink soda and juices that have many calories.  General   Eating smaller meals more often may be helpful. This will help keep your metabolism high and your hunger low. This will keep you from overeating at your next meal. Also, eating meals slowly helps you feel full faster.  ? If eating 3 meals is a part of your lifestyle, choose more low-fat proteins and higher fiber to fill you up at each meal. Skip the snack at night if you can. Drink a calorie-free beverage instead.   Do not skip meals. Most often if you skip a meal, you eat too much at the next meal.   Eat smaller portions. Use a smaller plate or bowl for meals, and when you are eating out, eat half and take the rest home.   Plan ahead. Plan your meals and grocery list before going  to the store. Planning will keep you from getting meals from fast foods or restaurants.   Do not go to the grocery store hungry. You are more likely to buy snacks that are not good for you.   Portion out snacks. When you are having a snack, instead of grabbing the whole bag, portion a small amount out to give yourself a stopping point.   Drink water before and after your meals to help fill you up without the calories.   When eating starchy foods, choose whole-grain products. These have a lot of fiber which will make you feel full. Fiber also helps lower cholesterol and helps with bowel function.   If you need a helpful start, ask your doctor to send you to a dietitian  for weight loss help.    What will the results be?  Losing excess weight will make your whole body healthier. You will have more energy for your daily activities and lower your risk for health problems.  What lifestyle changes are needed?  Stay active. Eating healthy is not always enough to lose weight. Burning calories by exercising is a big part of weight loss.  What foods are good to eat?  The key is to watch your portion sizes. It is best to choose foods that are lower in fat and calories.   Choose low-fat meats:  ? Boneless chicken breast  ? Pork loin  ? 90% lean beef  ? Lean [place] meat  ? Fresh fish (not fried)   Choose low-fat dairy products:  ? 1% or skim milk  ? Spray butter or margarine  ? Low-fat or fat-free cheese  ? Frozen yogurt or low calorie ice cream   Choose fresh fruits, green vegetables, beans and lentils, and whole wheat products more often.   Choose smart snacks:  ? Baked chips  ? Pretzels  ? Popcorn with no butter ? use pepper, garlic, or another spice to taste  ? High fiber, low-fat crackers  ? Reduced fat cookies  ? Diet or no-calorie beverages  What foods should be limited or avoided?  Limit high-fat, high-sodium, and high-calorie foods like:   Fried foods   Processed meats    Whole-fat dairy products   Candy, cookies, chips, pastries   Sausage, bacon, any full-fat meats   Soda, juice   Beer, wine, and mixed drinks (alcohol)  Will there be any other care needed?  What do I do first before trying to lose weight?   Talk to your doctor and dietitian to see if you need to lose weight. Work with them to set your weight loss goals.   If you have a chronic illness, such as high blood sugar or high blood pressure, ask a doctor or dietitian what diet and exercise is right for you.   Ask your doctor about how much you are able to exercise and what type of exercise is good for you.  Helpful tips   Keep a food journal to help keep you on track.   Do not eat 2 hours before bedtime.   Join a support group.  Tips for burning calories:   If your workplace is near your house, choose to walk or bike to work instead of driving.   Take 20-minute walks each day. Walk around during your lunch break. You will not only burn calories, but raise your energy for the rest of the day.   Take the stairs over the elevators.   Join a gym or exercise class with a friend.   Try to exercise 30 minutes a day. Three 10 minute sessions works too.   Drink lots of water before, during, and after exercise.  Where can I learn more?  CashmereCloseouts.hu  CheerfulGuy.tn  CashmereCloseouts.hu  SharpAnalyst.uy  Weight-Control Information Network  PreferredVet.ca  Weight-Control Information Network  http://www.white-smith.com/  Last Reviewed Date  2014-11-13  Consumer Information Use and Disclaimer  This information is not specific medical advice and does not replace information you receive from your health care provider. This is only a brief summary of general information. It does NOT include all information about  conditions, illnesses, injuries, tests, procedures, treatments, therapies, discharge instructions or life-style choices that may apply to you. You must talk with your health care provider for complete  information about your health and treatment options. This information should not be used to decide whether or not to accept your health care provider's advice, instructions or recommendations. Only your health care provider has the knowledge and training to provide advice that is right for you.  Copyright  Copyright  2019 [employer] and its affiliates and/or licensors. All rights reserved.  Weight loss guidelines and tips   1. Utilize measuring cups and spoons (found at a low price at [facility]) to monitor and manage serving sizes. Use them to carefully measure out serving sizes listed on food packaging to prevent overeating and better manage caloric intake. Most of [place] tell ourselves lies about how much a serving really is. For example, a serving of meat is actually 4 oz, which is the size of a deck of cards!!!   2. Utilize smart phone apps like "My Net Diary", "My Fitness Pal", "Lose It", or "Eat Better" to keep track of calorie intake and exercise. Also consider purchasing a pedometer to keep track of daily steps. Some smart phones have built in pedometers to keep track of step counts (your goal is 10,000 steps per day).   3. Make sure you are consuming enough water daily. Drink 8 oz prior to meals, or at snack time, to cut down on overeating.   4. Consider exercise programs. The [employer] offers water aerobics classes that are low-impact to minimize joint pains while still burning calories. For maximum impact at home, do small exercises while watching TV during commercial breaks.   The Truth Table:   3,500 calories = 1 pound of fat  Minus 500 calories a day x 7 days will lose you 1 pound  Exercise helps, but one mile walk = 200 calories burned   10,000 steps is your  goal for the day, that's 4-5 miles!   *Please cut out and put on your refrigerator*   ITrackBites app for weight loss management  Patient Education   Weight Loss Diet  About this topic  There are many "trendy" weight loss diets that are popular today. Many of these diets can end up being more harmful than helpful. The healthiest way to lose weight is to burn more calories than you eat.  A weight loss diet should help you have a healthy view of eating. It is NOT healthy to stop eating to try and lose weight. A good diet plan will help you cut down your food intake and make healthy choices.  A healthy weight loss goal is 1 to 2 pounds (0.5 to 1 kg) per week. It takes 3500 calories to lose 1 pound (0.5 kg). That means cutting out 500 calories every day for 7 days. You can lose these 500 calories per day by eating fewer calories, burning them through exercise, or both.  It may be easier than you think to cut 500 calories from your daily intake. You can:   Switch from whole milk to 1% or skim milk   Switch from regular cheese to fat-free cheese   Use healthier condiment choices  ? Fat-free sour cream or salad dressings  ? Spray butter  ? Diet syrups or jellies over regular   Try frozen yogurt as a dessert rather than eating ice cream.   Skip the chips. Snack on carrots, vegetables, or fruit. If chips are a favorite of yours, try the baked style.   Eat boiled, seared fish or skinless chicken rather than red meat.   Try  flavored no-calorie waters. Do not drink soda and juices that have many calories.  General   Eating smaller meals more often may be helpful. This will help keep your metabolism high and your hunger low. This will keep you from overeating at your next meal. Also, eating meals slowly helps you feel full faster.  ? If eating 3 meals is a part of your lifestyle, choose more low-fat proteins and higher fiber to fill you up at each meal. Skip the snack at night if you can. Drink a  calorie-free beverage instead.   Do not skip meals. Most often if you skip a meal, you eat too much at the next meal.   Eat smaller portions. Use a smaller plate or bowl for meals, and when you are eating out, eat half and take the rest home.   Plan ahead. Plan your meals and grocery list before going to the store. Planning will keep you from getting meals from fast foods or restaurants.   Do not go to the grocery store hungry. You are more likely to buy snacks that are not good for you.   Portion out snacks. When you are having a snack, instead of grabbing the whole bag, portion a small amount out to give yourself a stopping point.   Drink water before and after your meals to help fill you up without the calories.   When eating starchy foods, choose whole-grain products. These have a lot of fiber which will make you feel full. Fiber also helps lower cholesterol and helps with bowel function.   If you need a helpful start, ask your doctor to send you to a dietitian for weight loss help.    What will the results be?  Losing excess weight will make your whole body healthier. You will have more energy for your daily activities and lower your risk for health problems.  What lifestyle changes are needed?  Stay active. Eating healthy is not always enough to lose weight. Burning calories by exercising is a big part of weight loss.  What foods are good to eat?  The key is to watch your portion sizes. It is best to choose foods that are lower in fat and calories.   Choose low-fat meats:  ? Boneless chicken breast  ? Pork loin  ? 90% lean beef  ? Lean [place] meat  ? Fresh fish (not fried)   Choose low-fat dairy products:  ? 1% or skim milk  ? Spray butter or margarine  ? Low-fat or fat-free cheese  ? Frozen yogurt or low calorie ice cream   Choose fresh fruits, green vegetables, beans and lentils, and whole wheat products more often.   Choose smart snacks:  ? Baked chips  ? Pretzels   ? Popcorn with no butter ? use pepper, garlic, or another spice to taste  ? High fiber, low-fat crackers  ? Reduced fat cookies  ? Diet or no-calorie beverages  What foods should be limited or avoided?  Limit high-fat, high-sodium, and high-calorie foods like:   Fried foods   Processed meats   Whole-fat dairy products   Candy, cookies, chips, pastries   Sausage, bacon, any full-fat meats   Soda, juice   Beer, wine, and mixed drinks (alcohol)  Will there be any other care needed?  What do I do first before trying to lose weight?   Talk to your doctor and dietitian to see if you need to lose weight. Work with them to set your  weight loss goals.   If you have a chronic illness, such as high blood sugar or high blood pressure, ask a doctor or dietitian what diet and exercise is right for you.   Ask your doctor about how much you are able to exercise and what type of exercise is good for you.  Helpful tips   Keep a food journal to help keep you on track.   Do not eat 2 hours before bedtime.   Join a support group.  Tips for burning calories:   If your workplace is near your house, choose to walk or bike to work instead of driving.   Take 20-minute walks each day. Walk around during your lunch break. You will not only burn calories, but raise your energy for the rest of the day.   Take the stairs over the elevators.   Join a gym or exercise class with a friend.   Try to exercise 30 minutes a day. Three 10 minute sessions works too.   Drink lots of water before, during, and after exercise.  Where can I learn more?  CashmereCloseouts.hu  CheerfulGuy.tn  CashmereCloseouts.hu  SharpAnalyst.uy  Weight-Control Information Network  PreferredVet.ca  Weight-Control Information Network   http://www.white-smith.com/  Last Reviewed Date  2014-11-13  Consumer Information Use and Disclaimer  This information is not specific medical advice and does not replace information you receive from your health care provider. This is only a brief summary of general information. It does NOT include all information about conditions, illnesses, injuries, tests, procedures, treatments, therapies, discharge instructions or life-style choices that may apply to you. You must talk with your health care provider for complete information about your health and treatment options. This information should not be used to decide whether or not to accept your health care provider's advice, instructions or recommendations. Only your health care provider has the knowledge and training to provide advice that is right for you.  Copyright  Copyright  2019 [employer] and its affiliates and/or licensors. All rights reserved.

## 2018-11-16 ENCOUNTER — Telehealth: Payer: Self-pay | Admitting: Obstetrics & Gynecology

## 2018-11-16 ENCOUNTER — Other Ambulatory Visit: Payer: Self-pay | Admitting: Obstetrics and Gynecology

## 2018-11-16 DIAGNOSIS — D250 Submucous leiomyoma of uterus: Secondary | ICD-10-CM

## 2018-11-16 NOTE — Telephone Encounter (Signed)
Called patient regarding appointment and the following message was left:   We have you scheduled for an upcoming appointment at our office. At this time, patients are encouraged to come alone to their visits whenever possible, however, a support person, over age 41, may accompany you to your appointment if assistance is needed for safety or care concerns. Otherwise, support persons should remain outside until the visit is complete.   We ask if you have had any exposure to anyone suspected or confirmed of having COVID-19 or if you are experiencing any of the following, to call and reschedule your appointment: fever, cough, shortness of breath, muscle pain, diarrhea, rash, vomiting, abdominal pain, red eye, weakness, bruising, bleeding, joint pain, or a severe headache.   Please know we will ask you these questions or similar questions when you arrive for your appointment and again its how we are keeping everyone safe.    Also,to keep you safe, please use the provided hand sanitizer when you enter the office. We are asking everyone in the office to wear a mask to help prevent the spread of germs. If you have a mask of your own, please wear it to your appointment, if not, we are happy to provide one for you.  Thank you for understanding and your cooperation.    [facility] Staff

## 2018-11-17 ENCOUNTER — Encounter: Payer: Self-pay | Admitting: Obstetrics & Gynecology

## 2018-11-17 ENCOUNTER — Other Ambulatory Visit: Payer: Self-pay

## 2018-11-17 ENCOUNTER — Ambulatory Visit (INDEPENDENT_AMBULATORY_CARE_PROVIDER_SITE_OTHER): Payer: Medicare HMO | Admitting: Obstetrics & Gynecology

## 2018-11-17 ENCOUNTER — Ambulatory Visit (INDEPENDENT_AMBULATORY_CARE_PROVIDER_SITE_OTHER): Payer: Medicare HMO

## 2018-11-17 VITALS — BP 118/74 | HR 87 | Ht 62.0 in | Wt 247.0 lb

## 2018-11-17 DIAGNOSIS — D250 Submucous leiomyoma of uterus: Secondary | ICD-10-CM

## 2018-11-17 DIAGNOSIS — R5383 Other fatigue: Secondary | ICD-10-CM

## 2018-11-17 NOTE — Progress Notes (Signed)
PELVIC US TA/TV: heterogeneous anteverted uterus with two fibroids (#1) fibroid w/in the cervical canal 3.1 x 2.2 x 2.7 cm,(#2) posterior left subserosal fibroid 2 x 1.5 x 1.7 cm,EEC 7.8 mm,dominate right simple follicle  2.3 x 2 x 1.9 cm,normal left ovary,no free fluid,ovaries appear mobile,no pain during ultrasound

## 2018-11-17 NOTE — Progress Notes (Signed)
Follow up appointment for results  Chief Complaint  Patient presents with  . discuss [place] and surgery    Blood pressure 118/74, pulse 87, height 5\' 2"  (1.575 m), weight 247 lb (112 kg), last menstrual period 10/27/2018.  US Pelvis Transvaginal Non-ob (tv Only)  Result Date: 11/17/2018 GYNECOLOGIC SONOGRAM Lisa Simpson is a 43 y.o. G3P3 LMP 08/17/2018,she is here for a pelvic sonogram for menorrhagia. Uterus                      6.7 x 4.3 x 5.6 cm, Total uterine volume 83 cc, heterogeneous anteverted uterus with two fibroids (#1) fibroid w/in the cervical canal 3.1 x 2.2 x 2.7 cm,(#2) posterior left subserosal fibroid 2 x 1.5 x 1.7 cm Endometrium          7.8 mm, symmetrical, wnl Right ovary             4.6 x 2.4 x 2.7 cm, wnl,dominate right simple follicle  2.3 x 2 x 1.9 cm Left ovary                2.3 x 1.5 x 2.4 cm, wnl No free fluid Technician Comments: PELVIC US TA/TV: heterogeneous anteverted uterus with two fibroids (#1) fibroid w/in the cervical canal 3.1 x 2.2 x 2.7 cm,(#2) posterior left subserosal fibroid 2 x 1.5 x 1.7 cm,EEC 7.8 mm,dominate right simple follicle  2.3 x 2 x 1.9 cm,normal left ovary,no free fluid,ovaries appear mobile,no pain during ultrasound [employer] 11/17/2018 2:58 PM Clinical Impression and recommendations: I have reviewed the sonogram results above, combined with the patient's current clinical course, below are my impressions and any appropriate recommendations for management based on the sonographic findings. Uterus is relatively small with a 3 cm prolapsed myoma Endometrium is normal Ovaries are normal, physiologic right ovarian cyst Florian Buff 11/17/2018 3:21 PM  US Pelvis Complete  Result Date: 11/17/2018 GYNECOLOGIC SONOGRAM Lisa Simpson is a 43 y.o. G3P3 LMP 08/17/2018,she is here for a pelvic sonogram for menorrhagia. Uterus                      6.7 x 4.3 x 5.6 cm, Total uterine volume 83 cc, heterogeneous anteverted uterus with two fibroids (#1) fibroid  w/in the cervical canal 3.1 x 2.2 x 2.7 cm,(#2) posterior left subserosal fibroid 2 x 1.5 x 1.7 cm Endometrium          7.8 mm, symmetrical, wnl Right ovary             4.6 x 2.4 x 2.7 cm, wnl,dominate right simple follicle  2.3 x 2 x 1.9 cm Left ovary                2.3 x 1.5 x 2.4 cm, wnl No free fluid Technician Comments: PELVIC US TA/TV: heterogeneous anteverted uterus with two fibroids (#1) fibroid w/in the cervical canal 3.1 x 2.2 x 2.7 cm,(#2) posterior left subserosal fibroid 2 x 1.5 x 1.7 cm,EEC 7.8 mm,dominate right simple follicle  2.3 x 2 x 1.9 cm,normal left ovary,no free fluid,ovaries appear mobile,no pain during ultrasound [employer] 11/17/2018 2:58 PM Clinical Impression and recommendations: I have reviewed the sonogram results above, combined with the patient's current clinical course, below are my impressions and any appropriate recommendations for management based on the sonographic findings. Uterus is relatively small with a 3 cm prolapsed myoma Endometrium is normal Ovaries are normal, physiologic right ovarian cyst Mertie Clause  Eure 11/17/2018 3:21 PM     [facility]S ordered this encounter: No orders of the defined types were placed in this encounter.   Orders for this encounter: Orders Placed This Encounter  Procedures  . TSH    Impression:   ICD-10-CM   1. prolapsed submucosal myoma  D25.0   2. Lethargy  R53.83 TSH      Plan: Vaginal removal of a prolapsed submucosal myoma Check thyroid function  Follow Up: Return in about 5 weeks (around 12/22/2018) for [place], with Dr Elonda Husky.       Face to face time:  15 minutes  Greater than 50% of the visit time was spent in counseling and coordination of care with the patient.  The summary and outline of the counseling and care coordination is summarized in the note above.   All questions were answered.  Past Medical History:  Diagnosis Date  . Asthma   . Borderline hypercholesterolemia   . Family history of anesthesia  complication    "dad woke up during surgery"  . Gall stones 2014  . GERD (gastroesophageal reflux disease)   . Headache(784.0)    occasional  . Heart murmur   . Heart palpitations    "feels fluttery"  . History of kidney stones    2 currently  . Mastoiditis of left side 2015  . Pneumonia oct 2014   hx of  . Seizures ([place]) 2013    Past Surgical History:  Procedure Laterality Date  . CHOLECYSTECTOMY N/A 06/28/2013   Procedure: LAPAROSCOPIC CHOLECYSTECTOMY WITH INTRAOPERATIVE CHOLANGIOGRAM;  Surgeon: Adin Hector, MD;  Location: [facility];  Service: General;  Laterality: N/A;  . MULTIPLE TOOTH EXTRACTIONS    . TONSILLECTOMY     as child    OB History    Gravida  3   Para  3   Term      Preterm      AB      Living  3     SAB      TAB      Ectopic      Multiple      Live Births              Allergies  Allergen Reactions  . Diphenhydramine Hcl Palpitations and Shortness Of Breath  . Doxycycline Anaphylaxis    Throat swelling   . Morphine Anaphylaxis  . Hydrocodone-Acetaminophen Itching    Patient stated causes itching and whelps  . Morphine And Related     "made me crazy"  . Other     "made me crazy"  . Vicodin [Hydrocodone-Acetaminophen] Itching    Patient stated causes itching and whelps    Social History   Socioeconomic History  . Marital status: Legally Separated    Spouse name: Not on file  . Number of children: 3  . Years of education: Not on file  . Highest education level: 11th grade  Occupational History  . Not on file  Social Needs  . Financial resource strain: Not on file  . Food insecurity    Worry: Not on file    Inability: Not on file  . Transportation needs    Medical: Not on file    Non-medical: Not on file  Tobacco Use  . Smoking status: Current Every Day Smoker    Packs/day: 0.50    Years: 12.00    Pack years: 6.00    Types: Cigarettes  . Smokeless tobacco: Never Used  Substance and Sexual Activity  .  Alcohol use: No  . Drug use: No  . Sexual activity: Yes    Birth control/protection: None  Lifestyle  . Physical activity    Days per week: Not on file    Minutes per session: Not on file  . Stress: Not on file  Relationships  . Social Herbalist on phone: Not on file    Gets together: Not on file    Attends religious service: Not on file    Active member of club or organization: Not on file    Attends meetings of clubs or organizations: Not on file    Relationship status: Not on file  Other Topics Concern  . Not on file  Social History Narrative   Right handed     Family History  Problem Relation Age of Onset  . Hypertension Mother   . Hypercholesterolemia Mother   . Heart failure Father   . Hypertension Sister   . Hypercholesterolemia Sister   . COPD Sister   . Cancer Brother

## 2018-11-18 ENCOUNTER — Telehealth: Payer: Self-pay | Admitting: *Deleted

## 2018-11-18 ENCOUNTER — Telehealth: Payer: Self-pay | Admitting: Obstetrics & Gynecology

## 2018-11-18 MED ORDER — PROMETHAZINE HCL 25 MG PO TABS: 25.0000 mg | ORAL_TABLET | Freq: Four times a day (QID) | ORAL | 1 refills | Status: DC | PRN

## 2018-11-18 NOTE — Telephone Encounter (Signed)
Patient called requesting nausea medication.  Forgot to ask at her appointment yesterday. Thanks.

## 2018-11-18 NOTE — Telephone Encounter (Signed)
done

## 2018-11-18 NOTE — Telephone Encounter (Signed)
Patient inforrmed nausea medication had been sent in as requested.  Pt verbalized understanding.

## 2018-11-22 LAB — TSH: TSH: 0.998 u[IU]/mL (ref 0.450–4.500)

## 2018-12-02 ENCOUNTER — Other Ambulatory Visit: Payer: Self-pay | Admitting: Obstetrics & Gynecology

## 2018-12-08 NOTE — Patient Instructions (Signed)
Lisa Simpson  12/08/2018     @PREFPERIOPPHARMACY @   Your procedure is scheduled on  12/14/2018 .  Report to [facility] at  [phone]  A.M.  Call this number if you have problems the morning of surgery:  [phone]   Remember:  Do not eat or drink after midnight.                         Take these medicines the morning of surgery with A SIP OF WATER  Hydroxyzine, prilosec, dilantin, phenergan(if needed). Use your inhaler before you come.    Do not wear jewelry, make-up or nail polish.  Do not wear lotions, powders, or perfumes. Please wear deodorant and brush your teeth.  Do not shave 48 hours prior to surgery.  Men may shave face and neck.  Do not bring valuables to the hospital.  [facility] is not responsible for any belongings or valuables.  Contacts, dentures or bridgework may not be worn into surgery.  Leave your suitcase in the car.  After surgery it may be brought to your room.  For patients admitted to the hospital, discharge time will be determined by your treatment team.  Patients discharged the day of surgery will not be allowed to drive home.   Name and phone number of your driver:   family Special instructions:  None  Please read over the following fact sheets that you were given. Anesthesia Post-op Instructions and Care and Recovery After Surgery       Myomectomy, Care After This sheet gives you information about how to care for yourself after your procedure. Your health care provider may also give you more specific instructions. If you have problems or questions, contact your health care provider. What can I expect after the procedure? After the procedure, it is common to have:  Pain in your abdomen, especially at the incision areas. You will be given pain medicine to control the pain.  Tiredness. This is a normal part of the recovery process. Your energy level will return to normal over the coming weeks.  Vaginal bleeding. This is normal  and will stop in the coming weeks.  Constipation. Recovery time from this procedure will depend on the type of procedure you had and your general overall health prior to the procedure. Follow these instructions at home: Medicines  Take over-the-counter and prescription medicines only as told by your health care provider.  Do not take aspirin because it can cause bleeding.  If you were prescribed an antibiotic medicine, use it as told by your health care provider. Do not stop using the antibiotic even if you start to feel better.  Do not drive or use heavy machinery while taking prescription pain medicine.  Do not drink alcohol while taking prescription pain medicine. Incision care   Follow instructions from your health care provider about how to take care of any incisions. Make sure you: ? Wash your hands with soap and water before you change your bandage (dressing). If soap and water are not available, use hand sanitizer. ? Change your dressing as told by your health care provider. ? Leave stitches (sutures), skin glue, or adhesive strips in place. These skin closures may need to stay in place for 2 weeks or longer. If adhesive strip edges start to loosen and curl up, you may trim the loose edges. Do not remove adhesive strips completely unless your health care provider  tells you to do that.  Check your incision areas every day for signs of infection. Check for: ? Redness, swelling, or pain. ? Fluid or blood. ? Warmth. ? Pus or a bad smell.  Do not take baths, swim, or use a hot tub until your health care provider approves. Take showers as directed by your health care provider. Activity  Return to your normal activities as told by your health care provider. Ask your health care provider what activities are safe for you.  Do not do activities that require a lot of effort until your health care provider says it is okay.  Do not lift anything that is heavier than 15 lb (6.8 kg)  until your health care provider says that it is safe.  Do not douche, use tampons, or have sexual intercourse until your health care provider approves.  Walk daily but take frequent rest breaks if you tire easily.  Continue to practice deep breathing and coughing. If it hurts to cough, try holding a pillow against your belly as you cough.  Do not drive until your health care provider approves. General instructions  To prevent or treat constipation while you are taking prescription pain medicine, your health care provider may recommend that you: ? Drink enough fluid to keep your urine clear or pale yellow. ? Take over-the-counter or prescription medicines. ? Eat foods that are high in fiber, such as fresh fruits and vegetables, whole grains, and beans. ? Limit foods that are high in fat and processed sugars, such as fried and sweet foods.  Take your temperature twice a day and write it down. If you develop a fever, this may be a sign that you have an infection.  Do not drink alcohol.  Have someone help you at home for 1 week or until you can do your own household activities.  Keep all follow-up visits as told by your health care provider. This is important. Contact a health care provider if:  You have a fever.  You have increasing abdominal pain that is not relieved with medicine.  You have nausea, vomiting, or diarrhea.  You have pain when you urinate or you have blood in your urine.  You have a rash on your body.  You have pain or redness where your IV access tube was inserted.  You have redness, swelling, or pain around an incision.  You have fluid or blood coming from an incision.  An incision feels warm to the touch.  You have pus or a bad smell coming from an incision. Get help right away if:  You have weakness or light-headedness.  You have pain, swelling, or redness in your legs.  You have chest pain.  You faint.  You have shortness of breath.  You have  heavy vaginal bleeding.  You have an incision that is opening up. Summary  Recovery time from this procedure will depend on the type of procedure you had and your general overall health prior to the procedure.  If you were prescribed an antibiotic medicine, use it as told by your health care provider. Do not stop using the antibiotic even if you start to feel better.  Do not douche, use tampons, or have sexual intercourse until your health care provider approves.  Return to your normal activities as told by your health care provider. Ask your health care provider what activities are safe for you. This information is not intended to replace advice given to you by your health care provider. Make  sure you discuss any questions you have with your health care provider. Document Released: 06/25/2010 Document Revised: 01/15/2017 Document Reviewed: 03/05/2016 Elsevier Patient Education  2020 [employer] Anesthesia, Adult, Care After This sheet gives you information about how to care for yourself after your procedure. Your health care provider may also give you more specific instructions. If you have problems or questions, contact your health care provider. What can I expect after the procedure? After the procedure, the following side effects are common:  Pain or discomfort at the IV site.  Nausea.  Vomiting.  Sore throat.  Trouble concentrating.  Feeling cold or chills.  Weak or tired.  Sleepiness and fatigue.  Soreness and body aches. These side effects can affect parts of the body that were not involved in surgery. Follow these instructions at home:  For at least 24 hours after the procedure:  Have a responsible adult stay with you. It is important to have someone help care for you until you are awake and alert.  Rest as needed.  Do not: ? Participate in activities in which you could fall or become injured. ? Drive. ? Use heavy machinery. ? Drink alcohol. ?  Take sleeping pills or medicines that cause drowsiness. ? Make important decisions or sign legal documents. ? Take care of children on your own. Eating and drinking  Follow any instructions from your health care provider about eating or drinking restrictions.  When you feel hungry, start by eating small amounts of foods that are soft and easy to digest (bland), such as toast. Gradually return to your regular diet.  Drink enough fluid to keep your urine pale yellow.  If you vomit, rehydrate by drinking water, juice, or clear broth. General instructions  If you have sleep apnea, surgery and certain medicines can increase your risk for breathing problems. Follow instructions from your health care provider about wearing your sleep device: ? Anytime you are sleeping, including during daytime naps. ? While taking prescription pain medicines, sleeping medicines, or medicines that make you drowsy.  Return to your normal activities as told by your health care provider. Ask your health care provider what activities are safe for you.  Take over-the-counter and prescription medicines only as told by your health care provider.  If you smoke, do not smoke without supervision.  Keep all follow-up visits as told by your health care provider. This is important. Contact a health care provider if:  You have nausea or vomiting that does not get better with medicine.  You cannot eat or drink without vomiting.  You have pain that does not get better with medicine.  You are unable to pass urine.  You develop a skin rash.  You have a fever.  You have redness around your IV site that gets worse. Get help right away if:  You have difficulty breathing.  You have chest pain.  You have blood in your urine or stool, or you vomit blood. Summary  After the procedure, it is common to have a sore throat or nausea. It is also common to feel tired.  Have a responsible adult stay with you for the first  24 hours after general anesthesia. It is important to have someone help care for you until you are awake and alert.  When you feel hungry, start by eating small amounts of foods that are soft and easy to digest (bland), such as toast. Gradually return to your regular diet.  Drink enough fluid to keep your urine  pale yellow.  Return to your normal activities as told by your health care provider. Ask your health care provider what activities are safe for you. This information is not intended to replace advice given to you by your health care provider. Make sure you discuss any questions you have with your health care provider. Document Released: 05/11/2000 Document Revised: 02/05/2017 Document Reviewed: 09/18/2016 Elsevier Patient Education  2020 [employer]. How to Use Chlorhexidine for Bathing Chlorhexidine gluconate (CHG) is a germ-killing (antiseptic) solution that is used to clean the skin. It can get rid of the bacteria that normally live on the skin and can keep them away for about 24 hours. To clean your skin with CHG, you may be given:  A CHG solution to use in the shower or as part of a sponge bath.  A prepackaged cloth that contains CHG. Cleaning your skin with CHG may help lower the risk for infection:  While you are staying in the intensive care unit of the hospital.  If you have a vascular access, such as a central line, to provide short-term or long-term access to your veins.  If you have a catheter to drain urine from your bladder.  If you are on a ventilator. A ventilator is a machine that helps you breathe by moving air in and out of your lungs.  After surgery. What are the risks? Risks of using CHG include:  A skin reaction.  Hearing loss, if CHG gets in your ears.  Eye injury, if CHG gets in your eyes and is not rinsed out.  The CHG product catching fire. Make sure that you avoid smoking and flames after applying CHG to your skin. Do not use CHG:  If you  have a chlorhexidine allergy or have previously reacted to chlorhexidine.  On babies younger than 74 months of age. How to use CHG solution  Use CHG only as told by your health care provider, and follow the instructions on the label.  Use the full amount of CHG as directed. Usually, this is one bottle. During a shower Follow these steps when using CHG solution during a shower (unless your health care provider gives you different instructions): 1. Start the shower. 2. Use your normal soap and shampoo to wash your face and hair. 3. Turn off the shower or move out of the shower stream. 4. Pour the CHG onto a clean washcloth. Do not use any type of brush or rough-edged sponge. 5. Starting at your neck, lather your body down to your toes. Make sure you follow these instructions: ? If you will be having surgery, pay special attention to the part of your body where you will be having surgery. Scrub this area for at least 1 minute. ? Do not use CHG on your head or face. If the solution gets into your ears or eyes, rinse them well with water. ? Avoid your genital area. ? Avoid any areas of skin that have broken skin, cuts, or scrapes. ? Scrub your back and under your arms. Make sure to wash skin folds. 6. Let the lather sit on your skin for 1-2 minutes or as long as told by your health care provider. 7. Thoroughly rinse your entire body in the shower. Make sure that all body creases and crevices are rinsed well. 8. Dry off with a clean towel. Do not put any substances on your body afterward-such as powder, lotion, or perfume-unless you are told to do so by your health care provider. Only  use lotions that are recommended by the manufacturer. 9. Put on clean clothes or pajamas. 10. If it is the night before your surgery, sleep in clean sheets.  During a sponge bath Follow these steps when using CHG solution during a sponge bath (unless your health care provider gives you different instructions): 1.  Use your normal soap and shampoo to wash your face and hair. 2. Pour the CHG onto a clean washcloth. 3. Starting at your neck, lather your body down to your toes. Make sure you follow these instructions: ? If you will be having surgery, pay special attention to the part of your body where you will be having surgery. Scrub this area for at least 1 minute. ? Do not use CHG on your head or face. If the solution gets into your ears or eyes, rinse them well with water. ? Avoid your genital area. ? Avoid any areas of skin that have broken skin, cuts, or scrapes. ? Scrub your back and under your arms. Make sure to wash skin folds. 4. Let the lather sit on your skin for 1-2 minutes or as long as told by your health care provider. 5. Using a different clean, wet washcloth, thoroughly rinse your entire body. Make sure that all body creases and crevices are rinsed well. 6. Dry off with a clean towel. Do not put any substances on your body afterward-such as powder, lotion, or perfume-unless you are told to do so by your health care provider. Only use lotions that are recommended by the manufacturer. 7. Put on clean clothes or pajamas. 8. If it is the night before your surgery, sleep in clean sheets. How to use CHG prepackaged cloths  Only use CHG cloths as told by your health care provider, and follow the instructions on the label.  Use the CHG cloth on clean, dry skin.  Do not use the CHG cloth on your head or face unless your health care provider tells you to.  When washing with the CHG cloth: ? Avoid your genital area. ? Avoid any areas of skin that have broken skin, cuts, or scrapes. Before surgery Follow these steps when using a CHG cloth to clean before surgery (unless your health care provider gives you different instructions): 1. Using the CHG cloth, vigorously scrub the part of your body where you will be having surgery. Scrub using a back-and-forth motion for 3 minutes. The area on your body  should be completely wet with CHG when you are done scrubbing. 2. Do not rinse. Discard the cloth and let the area air-dry. Do not put any substances on the area afterward, such as powder, lotion, or perfume. 3. Put on clean clothes or pajamas. 4. If it is the night before your surgery, sleep in clean sheets.  For general bathing Follow these steps when using CHG cloths for general bathing (unless your health care provider gives you different instructions). 1. Use a separate CHG cloth for each area of your body. Make sure you wash between any folds of skin and between your fingers and toes. Wash your body in the following order, switching to a new cloth after each step: ? The front of your neck, shoulders, and chest. ? Both of your arms, under your arms, and your hands. ? Your stomach and groin area, avoiding the genitals. ? Your right leg and foot. ? Your left leg and foot. ? The back of your neck, your back, and your buttocks. 2. Do not rinse. Discard the cloth  and let the area air-dry. Do not put any substances on your body afterward-such as powder, lotion, or perfume-unless you are told to do so by your health care provider. Only use lotions that are recommended by the manufacturer. 3. Put on clean clothes or pajamas. Contact a health care provider if:  Your skin gets irritated after scrubbing.  You have questions about using your solution or cloth. Get help right away if:  Your eyes become very red or swollen.  Your eyes itch badly.  Your skin itches badly and is red or swollen.  Your hearing changes.  You have trouble seeing.  You have swelling or tingling in your mouth or throat.  You have trouble breathing.  You swallow any chlorhexidine. Summary  Chlorhexidine gluconate (CHG) is a germ-killing (antiseptic) solution that is used to clean the skin. Cleaning your skin with CHG may help to lower your risk for infection.  You may be given CHG to use for bathing. It may  be in a bottle or in a prepackaged cloth to use on your skin. Carefully follow your health care provider's instructions and the instructions on the product label.  Do not use CHG if you have a chlorhexidine allergy.  Contact your health care provider if your skin gets irritated after scrubbing. This information is not intended to replace advice given to you by your health care provider. Make sure you discuss any questions you have with your health care provider. Document Released: 10/28/2011 Document Revised: 04/21/2018 Document Reviewed: 12/31/2016 Elsevier Patient Education  2020 [employer].

## 2018-12-12 ENCOUNTER — Encounter (HOSPITAL_COMMUNITY)
Admission: RE | Admit: 2018-12-12 | Discharge: 2018-12-12 | Disposition: A | Discharge status: Home / Self Care | Payer: Medicare HMO | Source: Ambulatory Visit | Attending: Obstetrics & Gynecology | Admitting: Obstetrics & Gynecology

## 2018-12-12 ENCOUNTER — Other Ambulatory Visit (HOSPITAL_COMMUNITY)
Admission: RE | Admit: 2018-12-12 | Discharge: 2018-12-12 | Disposition: A | Discharge status: Home / Self Care | Payer: Medicare HMO | Source: Ambulatory Visit | Attending: Obstetrics & Gynecology | Admitting: Obstetrics & Gynecology

## 2018-12-12 ENCOUNTER — Other Ambulatory Visit: Payer: Self-pay

## 2018-12-12 ENCOUNTER — Telehealth: Payer: Self-pay | Admitting: *Deleted

## 2018-12-12 ENCOUNTER — Encounter (HOSPITAL_COMMUNITY): Payer: Self-pay

## 2018-12-12 DIAGNOSIS — Z01812 Encounter for preprocedural laboratory examination: Secondary | ICD-10-CM | POA: Insufficient documentation

## 2018-12-12 LAB — CBC
HCT: 43.6 % (ref 36.0–46.0)
Hemoglobin: 13.5 g/dL (ref 12.0–15.0)
MCH: 26.6 pg (ref 26.0–34.0)
MCHC: 31 g/dL (ref 30.0–36.0)
MCV: 86 fL (ref 80.0–100.0)
Platelets: 394 10*3/uL (ref 150–400)
RBC: 5.07 MIL/uL (ref 3.87–5.11)
RDW: 15.4 % (ref 11.5–15.5)
WBC: 13.7 10*3/uL — ABNORMAL HIGH (ref 4.0–10.5)
nRBC: 0 % (ref 0.0–0.2)

## 2018-12-12 LAB — RAPID HIV SCREEN (HIV 1/2 AB+AG)
HIV 1/2 Antibodies: NONREACTIVE
HIV-1 P24 Antigen - HIV24: NONREACTIVE

## 2018-12-12 LAB — COMPREHENSIVE METABOLIC PANEL
ALT: 19 U/L (ref 0–44)
AST: 16 U/L (ref 15–41)
Albumin: 3.8 g/dL (ref 3.5–5.0)
Alkaline Phosphatase: 74 U/L (ref 38–126)
Anion gap: 12 (ref 5–15)
BUN: 8 mg/dL (ref 6–20)
CO2: 22 mmol/L (ref 22–32)
Calcium: 9.1 mg/dL (ref 8.9–10.3)
Chloride: 107 mmol/L (ref 98–111)
Creatinine, Ser: 0.62 mg/dL (ref 0.44–1.00)
GFR calc Af Amer: >60 mL/min (ref >60)
GFR calc non Af Amer: >60 mL/min (ref >60)
Glucose, Bld: 142 mg/dL — ABNORMAL HIGH (ref 70–99)
Potassium: 3.7 mmol/L (ref 3.5–5.1)
Sodium: 141 mmol/L (ref 135–145)
Total Bilirubin: 0.6 mg/dL (ref 0.3–1.2)
Total Protein: 7.3 g/dL (ref 6.5–8.1)

## 2018-12-12 LAB — SARS CORONAVIRUS 2 (TAT 6-24 HRS): SARS Coronavirus 2: NEGATIVE

## 2018-12-12 LAB — TYPE AND SCREEN
ABO/RH(D): A NEG
Antibody Screen: NEGATIVE

## 2018-12-12 LAB — HCG, QUANTITATIVE, PREGNANCY: hCG, Beta Chain, Quant, S: 1 m[IU]/mL (ref <5)

## 2018-12-12 LAB — HM HIV SCREENING LAB: HM HIV Screening: NEGATIVE

## 2018-12-12 NOTE — Telephone Encounter (Signed)
Patient states she would like a call back from Dr Elonda Husky as she is unclear of the type of surgery she is scheduled for.  She thought she was having a partial hysterectomy but was told this was not what she was having when she went for her pre-op testing today. She is requesting a call back.

## 2018-12-12 NOTE — Telephone Encounter (Signed)
I answered her questions She understands the need to remove mass then figure out from there if anything needs to be done, but she also understands I may not be able to get it out without doing a hysterectomy  All questions were answered

## 2018-12-12 NOTE — Telephone Encounter (Signed)
Called patient to discuss type of surgery she is to have on Wednesday but patient is still unclear as to why the uterus would not just be taken out.   She is requesting a call from Dr Elonda Husky as she is "no longer need of it".

## 2018-12-12 NOTE — Pre-Procedure Instructions (Signed)
Patient in for PAT. She states that she and Dr Elonda Husky discussed if he cannot remove myoma vaginally and he has to open that he is going to do a vaginal hysterectomy. I reviewed notes and history and did not see this mentioned. Patient wants to go back to office to talk to Dr Elonda Husky before she signs consent.

## 2018-12-14 ENCOUNTER — Encounter (HOSPITAL_COMMUNITY)
Admission: RE | Disposition: A | Discharge status: Home / Self Care | Payer: Self-pay | Source: Home / Self Care | Attending: Obstetrics & Gynecology

## 2018-12-14 ENCOUNTER — Ambulatory Visit (HOSPITAL_COMMUNITY)
Admission: RE | Admit: 2018-12-14 | Discharge: 2018-12-14 | Disposition: A | Discharge status: Home / Self Care | Payer: Medicare HMO | Attending: Obstetrics & Gynecology | Admitting: Obstetrics & Gynecology

## 2018-12-14 ENCOUNTER — Encounter (HOSPITAL_COMMUNITY): Payer: Self-pay | Admitting: *Deleted

## 2018-12-14 ENCOUNTER — Ambulatory Visit (HOSPITAL_COMMUNITY): Payer: Medicare HMO | Admitting: Anesthesiology

## 2018-12-14 DIAGNOSIS — F17210 Nicotine dependence, cigarettes, uncomplicated: Secondary | ICD-10-CM | POA: Diagnosis not present

## 2018-12-14 DIAGNOSIS — G40909 Epilepsy, unspecified, not intractable, without status epilepticus: Secondary | ICD-10-CM | POA: Diagnosis not present

## 2018-12-14 DIAGNOSIS — Z79899 Other long term (current) drug therapy: Secondary | ICD-10-CM | POA: Insufficient documentation

## 2018-12-14 DIAGNOSIS — D250 Submucous leiomyoma of uterus: Secondary | ICD-10-CM | POA: Diagnosis not present

## 2018-12-14 DIAGNOSIS — K219 Gastro-esophageal reflux disease without esophagitis: Secondary | ICD-10-CM | POA: Insufficient documentation

## 2018-12-14 DIAGNOSIS — Z8249 Family history of ischemic heart disease and other diseases of the circulatory system: Secondary | ICD-10-CM | POA: Diagnosis not present

## 2018-12-14 DIAGNOSIS — J45909 Unspecified asthma, uncomplicated: Secondary | ICD-10-CM | POA: Insufficient documentation

## 2018-12-14 DIAGNOSIS — N814 Uterovaginal prolapse, unspecified: Secondary | ICD-10-CM | POA: Diagnosis not present

## 2018-12-14 DIAGNOSIS — Z87442 Personal history of urinary calculi: Secondary | ICD-10-CM | POA: Diagnosis not present

## 2018-12-14 DIAGNOSIS — D259 Leiomyoma of uterus, unspecified: Secondary | ICD-10-CM

## 2018-12-14 DIAGNOSIS — N938 Other specified abnormal uterine and vaginal bleeding: Secondary | ICD-10-CM | POA: Diagnosis present

## 2018-12-14 HISTORY — PX: MYOMECTOMY: SHX85

## 2018-12-14 SURGERY — MYOMECTOMY, UTERUS, VAGINAL APPROACH
Anesthesia: General | Site: Vagina

## 2018-12-14 MED ORDER — BUPIVACAINE-EPINEPHRINE (PF) 0.5% -1:200000 IJ SOLN
INTRAMUSCULAR | Status: AC
  Filled 2018-12-14: qty 30

## 2018-12-14 MED ORDER — ONDANSETRON HCL 4 MG/2ML IJ SOLN: 4.0000 mg | Freq: Once | INTRAMUSCULAR | Status: DC | PRN

## 2018-12-14 MED ORDER — KETOROLAC TROMETHAMINE 30 MG/ML IJ SOLN
30.0000 mg | Freq: Once | INTRAMUSCULAR | Status: AC
  Administered 2018-12-14: 30 mg via INTRAVENOUS
  Filled 2018-12-14: qty 1

## 2018-12-14 MED ORDER — CEFAZOLIN SODIUM-DEXTROSE 2-4 GM/100ML-% IV SOLN
2.0000 g | INTRAVENOUS | Status: AC
  Administered 2018-12-14: 2 g via INTRAVENOUS
  Filled 2018-12-14: qty 100

## 2018-12-14 MED ORDER — PHENYTOIN 50 MG PO CHEW
200.0000 mg | CHEWABLE_TABLET | Freq: Once | ORAL | Status: AC
  Administered 2018-12-14: 200 mg via ORAL
  Filled 2018-12-14: qty 4

## 2018-12-14 MED ORDER — MIDAZOLAM HCL 5 MG/5ML IJ SOLN
INTRAMUSCULAR | Status: DC | PRN
  Administered 2018-12-14: 2 mg via INTRAVENOUS

## 2018-12-14 MED ORDER — MEPERIDINE HCL 50 MG/ML IJ SOLN: 6.2500 mg | INTRAMUSCULAR | Status: DC | PRN

## 2018-12-14 MED ORDER — ROCURONIUM BROMIDE 100 MG/10ML IV SOLN
INTRAVENOUS | Status: DC | PRN
  Administered 2018-12-14: 40 mg via INTRAVENOUS

## 2018-12-14 MED ORDER — LACTATED RINGERS IV SOLN
Freq: Once | INTRAVENOUS | Status: AC
  Administered 2018-12-14: 10:00:00 1000 mL via INTRAVENOUS

## 2018-12-14 MED ORDER — SODIUM CHLORIDE 0.9 % IR SOLN
Status: DC | PRN
  Administered 2018-12-14: 3000 mL

## 2018-12-14 MED ORDER — LACTATED RINGERS IV SOLN
INTRAVENOUS | Status: DC | PRN
  Administered 2018-12-14: 10:00:00 via INTRAVENOUS

## 2018-12-14 MED ORDER — HYDROMORPHONE HCL 1 MG/ML IJ SOLN: 0.2500 mg | INTRAMUSCULAR | Status: DC | PRN

## 2018-12-14 MED ORDER — GLYCOPYRROLATE PF 0.2 MG/ML IJ SOSY
PREFILLED_SYRINGE | INTRAMUSCULAR | Status: DC | PRN
  Administered 2018-12-14: .2 mg via INTRAVENOUS

## 2018-12-14 MED ORDER — ROCURONIUM BROMIDE 10 MG/ML (PF) SYRINGE
PREFILLED_SYRINGE | INTRAVENOUS | Status: AC
  Filled 2018-12-14: qty 10

## 2018-12-14 MED ORDER — MIDAZOLAM HCL 2 MG/2ML IJ SOLN
INTRAMUSCULAR | Status: AC
  Filled 2018-12-14: qty 2

## 2018-12-14 MED ORDER — 0.9 % SODIUM CHLORIDE (POUR BTL) OPTIME
TOPICAL | Status: DC | PRN
  Administered 2018-12-14: 1000 mL

## 2018-12-14 MED ORDER — KETOROLAC TROMETHAMINE 10 MG PO TABS: 10.0000 mg | ORAL_TABLET | Freq: Three times a day (TID) | ORAL | 0 refills | Status: DC | PRN

## 2018-12-14 MED ORDER — PROPOFOL 10 MG/ML IV BOLUS
INTRAVENOUS | Status: DC | PRN
  Administered 2018-12-14: 200 mg via INTRAVENOUS
  Administered 2018-12-14: 80 mg via INTRAVENOUS

## 2018-12-14 MED ORDER — FENTANYL CITRATE (PF) 100 MCG/2ML IJ SOLN
INTRAMUSCULAR | Status: DC | PRN
  Administered 2018-12-14 (×2): 100 ug via INTRAVENOUS
  Administered 2018-12-14: 50 ug via INTRAVENOUS

## 2018-12-14 MED ORDER — SUCCINYLCHOLINE CHLORIDE 20 MG/ML IJ SOLN
INTRAMUSCULAR | Status: DC | PRN
  Administered 2018-12-14: 180 mg via INTRAVENOUS

## 2018-12-14 MED ORDER — LIDOCAINE HCL (CARDIAC) PF 100 MG/5ML IV SOSY
PREFILLED_SYRINGE | INTRAVENOUS | Status: DC | PRN
  Administered 2018-12-14: 50 mg via INTRAVENOUS

## 2018-12-14 MED ORDER — FENTANYL CITRATE (PF) 250 MCG/5ML IJ SOLN
INTRAMUSCULAR | Status: AC
  Filled 2018-12-14: qty 5

## 2018-12-14 MED ORDER — SUGAMMADEX SODIUM 200 MG/2ML IV SOLN
INTRAVENOUS | Status: DC | PRN
  Administered 2018-12-14: 224 mg via INTRAVENOUS

## 2018-12-14 SURGICAL SUPPLY — 34 items
CLOTH BEACON ORANGE TIMEOUT ST (SAFETY) ×3 IMPLANT
COVER LIGHT HANDLE STERIS (MISCELLANEOUS) ×6 IMPLANT
COVER WAND RF STERILE (DRAPES) ×3 IMPLANT
DECANTER SPIKE VIAL GLASS SM (MISCELLANEOUS) ×3 IMPLANT
DRAPE HALF SHEET 40X57 (DRAPES) ×3 IMPLANT
DRAPE STERI URO 9X17 APER PCH (DRAPES) ×3 IMPLANT
ELECT REM PT RETURN 9FT ADLT (ELECTROSURGICAL) ×3
ELECTRODE REM PT RTRN 9FT ADLT (ELECTROSURGICAL) ×1 IMPLANT
GAUZE 4X4 16PLY RFD (DISPOSABLE) ×3 IMPLANT
GLOVE BIOGEL PI IND STRL 6.5 (GLOVE) ×1 IMPLANT
GLOVE BIOGEL PI IND STRL 7.0 (GLOVE) ×2 IMPLANT
GLOVE BIOGEL PI IND STRL 8 (GLOVE) ×1 IMPLANT
GLOVE BIOGEL PI INDICATOR 6.5 (GLOVE) ×2
GLOVE BIOGEL PI INDICATOR 7.0 (GLOVE) ×4
GLOVE BIOGEL PI INDICATOR 8 (GLOVE) ×2
GLOVE ECLIPSE 6.5 STRL STRAW (GLOVE) ×3 IMPLANT
GLOVE ECLIPSE 8.0 STRL XLNG CF (GLOVE) ×3 IMPLANT
GLOVE SURG SS PI 6.5 STRL IVOR (GLOVE) ×3 IMPLANT
GOWN STRL REUS W/TWL LRG LVL3 (GOWN DISPOSABLE) ×6 IMPLANT
GOWN STRL REUS W/TWL XL LVL3 (GOWN DISPOSABLE) ×3 IMPLANT
IV NS IRRIG 3000ML ARTHROMATIC (IV SOLUTION) ×3 IMPLANT
KIT BLADEGUARD II DBL (SET/KITS/TRAYS/PACK) ×3 IMPLANT
KIT TURNOVER CYSTO (KITS) ×3 IMPLANT
MANIFOLD NEPTUNE II (INSTRUMENTS) ×3 IMPLANT
NEEDLE HYPO 22GX1.5 SAFETY (NEEDLE) ×3 IMPLANT
NS IRRIG 1000ML POUR BTL (IV SOLUTION) ×3 IMPLANT
PACK PERI GYN (CUSTOM PROCEDURE TRAY) ×3 IMPLANT
PAD ARMBOARD 7.5X6 YLW CONV (MISCELLANEOUS) ×3 IMPLANT
SET BASIN LINEN APH (SET/KITS/TRAYS/PACK) ×3 IMPLANT
SUT VIC AB 0 CT1 27 (SUTURE) ×4
SUT VIC AB 0 CT1 27XCR 8 STRN (SUTURE) ×2 IMPLANT
SYR CONTROL 10ML LL (SYRINGE) ×3 IMPLANT
VERSALIGHT (MISCELLANEOUS) ×3 IMPLANT
WATER STERILE IRR 1000ML POUR (IV SOLUTION) ×3 IMPLANT

## 2018-12-14 NOTE — Anesthesia Procedure Notes (Signed)
Procedure Name: Intubation Date/Time: 12/14/2018 10:13 AM Performed by: Andree Elk, Porshe Fleagle A, CRNA Pre-anesthesia Checklist: Patient identified, Patient being monitored, Timeout performed, Emergency Drugs available and Suction available Patient Re-evaluated:Patient Re-evaluated prior to induction Oxygen Delivery Method: Circle system utilized Preoxygenation: Pre-oxygenation with 100% oxygen Induction Type: IV induction, Rapid sequence and Cricoid Pressure applied Laryngoscope Size: 3 and Glidescope Grade View: Grade I Tube type: Oral Tube size: 7.0 mm Number of attempts: 1 Airway Equipment and Method: Stylet and Video-laryngoscopy Placement Confirmation: ETT inserted through vocal cords under direct vision,  positive ETCO2 and breath sounds checked- equal and bilateral Secured at: 21 cm Tube secured with: Tape Dental Injury: Teeth and Oropharynx as per pre-operative assessment

## 2018-12-14 NOTE — H&P (Signed)
Preoperative History and Physical  Lisa Simpson is a 43 y.o. G3P3 with Patient's last menstrual period was 12/04/2018 (exact date). admitted for a removal of a prolapsed uterine myoma, vaginal approach.    PMH:    Past Medical History:  Diagnosis Date  . Asthma   . Borderline hypercholesterolemia   . Family history of anesthesia complication    "dad woke up during surgery"  . Gall stones 2014  . GERD (gastroesophageal reflux disease)   . Headache(784.0)    occasional  . Heart murmur   . Heart palpitations    "feels fluttery"  . History of kidney stones    2 currently  . Mastoiditis of left side 2015  . Pneumonia oct 2014   hx of  . Seizures ([place]) 2013   On meds since childhood, unknown etiology, No seizures for about 1 year.    PSH:     Past Surgical History:  Procedure Laterality Date  . CHOLECYSTECTOMY N/A 06/28/2013   Procedure: LAPAROSCOPIC CHOLECYSTECTOMY WITH INTRAOPERATIVE CHOLANGIOGRAM;  Surgeon: Adin Hector, MD;  Location: [facility];  Service: General;  Laterality: N/A;  . MULTIPLE TOOTH EXTRACTIONS    . TONSILLECTOMY     as child    POb/GynH:      OB History    Gravida  3   Para  3   Term      Preterm      AB      Living  3     SAB      TAB      Ectopic      Multiple      Live Births              SH:   Social History   Tobacco Use  . Smoking status: Current Every Day Smoker    Packs/day: 0.50    Years: 12.00    Pack years: 6.00    Types: Cigarettes  . Smokeless tobacco: Never Used  Substance Use Topics  . Alcohol use: No  . Drug use: No    FH:    Family History  Problem Relation Age of Onset  . Hypertension Mother   . Hypercholesterolemia Mother   . Heart failure Father   . Hypertension Sister   . Hypercholesterolemia Sister   . COPD Sister   . Cancer Brother      Allergies:  Allergies  Allergen Reactions  . Diphenhydramine Hcl Palpitations and Shortness Of Breath  . Doxycycline Anaphylaxis    Throat  swelling   . Morphine Anaphylaxis  . Hydrocodone-Acetaminophen Itching    Patient stated causes itching and whelps  . Morphine And Related     "made me crazy"  . Other     "made me crazy"  . Vicodin [Hydrocodone-Acetaminophen] Itching    Patient stated causes itching and whelps    Medications:       Current Facility-Administered Medications:  .  ceFAZolin (ANCEF) IVPB 2g/100 mL premix, 2 g, Intravenous, On Call to OR, Florian Buff, MD .  phenytoin (DILANTIN) chewable tablet 200 mg, 200 mg, Oral, Once, Battula, Rajamani C, MD  Review of Systems:   Review of Systems  Constitutional: Negative for fever, chills, weight loss, malaise/fatigue and diaphoresis.  HENT: Negative for hearing loss, ear pain, nosebleeds, congestion, sore throat, neck pain, tinnitus and ear discharge.   Eyes: Negative for blurred vision, double vision, photophobia, pain, discharge and redness.  Respiratory: Negative for cough, hemoptysis, sputum production, shortness of  breath, wheezing and stridor.   Cardiovascular: Negative for chest pain, palpitations, orthopnea, claudication, leg swelling and PND.  Gastrointestinal: Positive for abdominal pain. Negative for heartburn, nausea, vomiting, diarrhea, constipation, blood in stool and melena.  Genitourinary: Negative for dysuria, urgency, frequency, hematuria and flank pain.  Musculoskeletal: Negative for myalgias, back pain, joint pain and falls.  Skin: Negative for itching and rash.  Neurological: Negative for dizziness, tingling, tremors, sensory change, speech change, focal weakness, seizures, loss of consciousness, weakness and headaches.  Endo/Heme/Allergies: Negative for environmental allergies and polydipsia. Does not bruise/bleed easily.  Psychiatric/Behavioral: Negative for depression, suicidal ideas, hallucinations, memory loss and substance abuse. The patient is not nervous/anxious and does not have insomnia.      PHYSICAL EXAM:  Blood pressure  136/72, pulse 95, temperature 98.9 F (37.2 C), temperature source Oral, resp. rate (!) 22, last menstrual period 12/04/2018, SpO2 96 %.    Vitals reviewed. Constitutional: She is oriented to person, place, and time. She appears well-developed and well-nourished.  HENT:  Head: Normocephalic and atraumatic.  Right Ear: External ear normal.  Left Ear: External ear normal.  Nose: Nose normal.  Mouth/Throat: Oropharynx is clear and moist.  Eyes: Conjunctivae and EOM are normal. Pupils are equal, round, and reactive to light. Right eye exhibits no discharge. Left eye exhibits no discharge. No scleral icterus.  Neck: Normal range of motion. Neck supple. No tracheal deviation present. No thyromegaly present.  Cardiovascular: Normal rate, regular rhythm, normal heart sounds and intact distal pulses.  Exam reveals no gallop and no friction rub.   No murmur heard. Respiratory: Effort normal and breath sounds normal. No respiratory distress. She has no wheezes. She has no rales. She exhibits no tenderness.  GI: Soft. Bowel sounds are normal. She exhibits no distension and no mass. There is tenderness. There is no rebound and no guarding.  Genitourinary:       Vulva is normal without lesions Vagina is pink moist without discharge Cervix normal in appearance and pap is normal, 4 cm prolapsed myoma protruding through the cervix Uterus is normal size, contour, position, consistency, mobility, non-tender Adnexa is negative with normal sized ovaries by sonogram  Musculoskeletal: Normal range of motion. She exhibits no edema and no tenderness.  Neurological: She is alert and oriented to person, place, and time. She has normal reflexes. She displays normal reflexes. No cranial nerve deficit. She exhibits normal muscle tone. Coordination normal.  Skin: Skin is warm and dry. No rash noted. No erythema. No pallor.  Psychiatric: She has a normal mood and affect. Her behavior is normal. Judgment and thought  content normal.    Labs: Results for orders placed or performed during the hospital encounter of 12/12/18 (from the past 336 hour(s))  CBC   Collection Time: 12/12/18  9:17 AM  Result Value Ref Range   WBC 13.7 (H) 4.0 - 10.5 K/uL   RBC 5.07 3.87 - 5.11 MIL/uL   Hemoglobin 13.5 12.0 - 15.0 g/dL   HCT 43.6 36.0 - 46.0 %   MCV 86.0 80.0 - 100.0 fL   MCH 26.6 26.0 - 34.0 pg   MCHC 31.0 30.0 - 36.0 g/dL   RDW 15.4 11.5 - 15.5 %   Platelets 394 150 - 400 K/uL   nRBC 0.0 0.0 - 0.2 %  Comprehensive metabolic panel   Collection Time: 12/12/18  9:17 AM  Result Value Ref Range   Sodium 141 135 - 145 mmol/L   Potassium 3.7 3.5 - 5.1 mmol/L  Chloride 107 98 - 111 mmol/L   CO2 22 22 - 32 mmol/L   Glucose, Bld 142 (H) 70 - 99 mg/dL   BUN 8 6 - 20 mg/dL   Creatinine, Ser 0.62 0.44 - 1.00 mg/dL   Calcium 9.1 8.9 - 10.3 mg/dL   Total Protein 7.3 6.5 - 8.1 g/dL   Albumin 3.8 3.5 - 5.0 g/dL   AST 16 15 - 41 U/L   ALT 19 0 - 44 U/L   Alkaline Phosphatase 74 38 - 126 U/L   Total Bilirubin 0.6 0.3 - 1.2 mg/dL   GFR calc non Af Amer >60 >60 mL/min   GFR calc Af Amer >60 >60 mL/min   Anion gap 12 5 - 15  hCG, quantitative, pregnancy   Collection Time: 12/12/18  9:17 AM  Result Value Ref Range   hCG, Beta Chain, Quant, S 1 <5 mIU/mL  Rapid HIV screen (HIV 1/2 Ab+Ag)   Collection Time: 12/12/18  9:17 AM  Result Value Ref Range   HIV-1 P24 Antigen - HIV24 NON REACTIVE NON REACTIVE   HIV 1/2 Antibodies NON REACTIVE NON REACTIVE   Interpretation (HIV Ag Ab)      A non reactive test result means that HIV 1 or HIV 2 antibodies and HIV 1 p24 antigen were not detected in the specimen.  Type and screen   Collection Time: 12/12/18  9:17 AM  Result Value Ref Range   ABO/RH(D) A NEG    Antibody Screen NEG    Sample Expiration      12/15/2018,2359 Performed at [facility], [address] [place],  [address]   Results for orders placed or performed during the hospital encounter of  12/12/18 (from the past 336 hour(s))  SARS CORONAVIRUS 2 (TAT 6-24 HRS) Nasopharyngeal Nasopharyngeal Swab   Collection Time: 12/12/18  7:19 AM   Specimen: Nasopharyngeal Swab  Result Value Ref Range   SARS Coronavirus 2 NEGATIVE NEGATIVE    EKG: Orders placed or performed in visit on 12/12/18  . EKG 12-Lead    Imaging Studies: US Pelvis Transvaginal Non-ob (tv Only)  Result Date: 11/17/2018 GYNECOLOGIC SONOGRAM Marlisha ZAYLEIGH NEDER is a 43 y.o. G3P3 LMP 08/17/2018,she is here for a pelvic sonogram for menorrhagia. Uterus                      6.7 x 4.3 x 5.6 cm, Total uterine volume 83 cc, heterogeneous anteverted uterus with two fibroids (#1) fibroid w/in the cervical canal 3.1 x 2.2 x 2.7 cm,(#2) posterior left subserosal fibroid 2 x 1.5 x 1.7 cm Endometrium          7.8 mm, symmetrical, wnl Right ovary             4.6 x 2.4 x 2.7 cm, wnl,dominate right simple follicle  2.3 x 2 x 1.9 cm Left ovary                2.3 x 1.5 x 2.4 cm, wnl No free fluid Technician Comments: PELVIC US TA/TV: heterogeneous anteverted uterus with two fibroids (#1) fibroid w/in the cervical canal 3.1 x 2.2 x 2.7 cm,(#2) posterior left subserosal fibroid 2 x 1.5 x 1.7 cm,EEC 7.8 mm,dominate right simple follicle  2.3 x 2 x 1.9 cm,normal left ovary,no free fluid,ovaries appear mobile,no pain during ultrasound [employer] 11/17/2018 2:58 PM Clinical Impression and recommendations: I have reviewed the sonogram results above, combined with the patient's current clinical course, below are my impressions  and any appropriate recommendations for management based on the sonographic findings. Uterus is relatively small with a 3 cm prolapsed myoma Endometrium is normal Ovaries are normal, physiologic right ovarian cyst Florian Buff 11/17/2018 3:21 PM  US Pelvis Complete  Result Date: 11/17/2018 GYNECOLOGIC SONOGRAM Kaylla INGE DOENGES is a 43 y.o. G3P3 LMP 08/17/2018,she is here for a pelvic sonogram for menorrhagia. Uterus                       6.7 x 4.3 x 5.6 cm, Total uterine volume 83 cc, heterogeneous anteverted uterus with two fibroids (#1) fibroid w/in the cervical canal 3.1 x 2.2 x 2.7 cm,(#2) posterior left subserosal fibroid 2 x 1.5 x 1.7 cm Endometrium          7.8 mm, symmetrical, wnl Right ovary             4.6 x 2.4 x 2.7 cm, wnl,dominate right simple follicle  2.3 x 2 x 1.9 cm Left ovary                2.3 x 1.5 x 2.4 cm, wnl No free fluid Technician Comments: PELVIC US TA/TV: heterogeneous anteverted uterus with two fibroids (#1) fibroid w/in the cervical canal 3.1 x 2.2 x 2.7 cm,(#2) posterior left subserosal fibroid 2 x 1.5 x 1.7 cm,EEC 7.8 mm,dominate right simple follicle  2.3 x 2 x 1.9 cm,normal left ovary,no free fluid,ovaries appear mobile,no pain during ultrasound [employer] 11/17/2018 2:58 PM Clinical Impression and recommendations: I have reviewed the sonogram results above, combined with the patient's current clinical course, below are my impressions and any appropriate recommendations for management based on the sonographic findings. Uterus is relatively small with a 3 cm prolapsed myoma Endometrium is normal Ovaries are normal, physiologic right ovarian cyst Florian Buff 11/17/2018 3:21 PM     Assessment: Prolapsed myoma, through the cervix DUB  Plan: Vaginal approach removal of prolapsed myoma  Florian Buff 12/14/2018 9:57 AM

## 2018-12-14 NOTE — Anesthesia Preprocedure Evaluation (Signed)
Anesthesia Evaluation  Patient identified by MRN, date of birth, ID band Patient awake    Reviewed: Allergy & Precautions, NPO status , Patient's Chart, lab work & pertinent test results  History of Anesthesia Complications (+) Family history of anesthesia reaction  Airway Mallampati: II  TM Distance: >3 FB Neck ROM: Full    Dental  (+) Edentulous Upper, Missing, Dental Advisory Given,    Pulmonary asthma , pneumonia, Current SmokerPatient did not abstain from smoking.,           Cardiovascular METS: 3 - Mets + Valvular Problems/Murmurs      Neuro/Psych  Headaches, Seizures - (last seizure 1 year ago), Well Controlled,  PSYCHIATRIC DISORDERS Anxiety Depression  Neuromuscular disease    GI/Hepatic GERD  Medicated,  Endo/Other    Renal/GU      Musculoskeletal   Abdominal   Peds  Hematology   Anesthesia Other Findings   Reproductive/Obstetrics                            Anesthesia Physical Anesthesia Plan  ASA: III  Anesthesia Plan: General   Post-op Pain Management:    Induction: Intravenous  PONV Risk Score and Plan: 3 and Ondansetron, Dexamethasone and Midazolam  Airway Management Planned: Oral ETT  Additional Equipment:   Intra-op Plan:   Post-operative Plan: Extubation in OR  Informed Consent: I have reviewed the patients History and Physical, chart, labs and discussed the procedure including the risks, benefits and alternatives for the proposed anesthesia with the patient or authorized representative who has indicated his/her understanding and acceptance.     Dental advisory given  Plan Discussed with: CRNA  Anesthesia Plan Comments:         Anesthesia Quick Evaluation

## 2018-12-14 NOTE — Transfer of Care (Signed)
Immediate Anesthesia Transfer of Care Note  Patient: Lisa Simpson  Procedure(s) Performed: VAGINAL REMOVAL OF PROLAPSED SUBMUCOSAL MYOMA (N/A Vagina )  Patient Location: PACU  Anesthesia Type:General  Level of Consciousness: oriented, drowsy and patient cooperative  Airway & Oxygen Therapy: Patient Spontanous Breathing and Patient connected to face mask oxygen  Post-op Assessment: Report given to RN and Post -op Vital signs reviewed and stable  Post vital signs: Reviewed and stable  Last Vitals:  Vitals Value Taken Time  BP    Temp    Pulse 80 12/14/18 1054  Resp 17 12/14/18 1054  SpO2 94 % 12/14/18 1054  Vitals shown include unvalidated device data.  Last Pain:  Vitals:   12/14/18 0923  TempSrc: Oral  PainSc: 0-No pain      Patients Stated Pain Goal: 9 ([id number])  Complications: No apparent anesthesia complications

## 2018-12-14 NOTE — Op Note (Signed)
Pre Op Diagnosis: prolapsed uterine myoma with DUB  Post op diagnosis:  SAA  Procedure:  Myomectomy, vainal approach  Surgeon:  Florian Buff, MD  Anesthesia: LMA  Findings:  4 cm prolapsed uterine myoma  Description of operation: Pt taken to OR and underwent general anesthesia Placed in lithotomy position with cany cane stirrups Prepped and draped in the usual sterile fashion Weighted speculum and deavers used for exposure  Pedunculated myoma was grasped and cross clamped with a single Haney clamped 0 vicryl fore and aft suture placed and tied down Myoma excised  Hemostatic  Pt awakened from anesthesia Taken to PACU in good stable condition  Routine post op care.  Florian Buff, MD

## 2018-12-14 NOTE — Anesthesia Postprocedure Evaluation (Signed)
Anesthesia Post Note  Patient: Lisa Simpson  Procedure(s) Performed: VAGINAL REMOVAL OF PROLAPSED SUBMUCOSAL MYOMA (N/A Vagina )  Anesthesia Type: General Level of consciousness: awake and alert, oriented and patient cooperative Pain management: pain level controlled Vital Signs Assessment: post-procedure vital signs reviewed and stable Respiratory status: spontaneous breathing Cardiovascular status: stable Postop Assessment: no apparent nausea or vomiting Anesthetic complications: no     Last Vitals:  Vitals:   12/14/18 1100 12/14/18 1115  BP: (!) 120/46   Pulse: 81 95  Resp: 15 14  Temp:    SpO2: 93% 92%    Last Pain:  Vitals:   12/14/18 1100  TempSrc:   PainSc: 0-No pain                 ADAMS, AMY A

## 2018-12-14 NOTE — Discharge Instructions (Signed)
Vaginal myomectomy, Care After Refer to this sheet in the next few weeks. These instructions provide you with information about caring for yourself after your procedure. Your health care provider may also give you more specific instructions. Your treatment has been planned according to current medical practices, but problems sometimes occur. Call your health care provider if you have any problems or questions after your procedure. What can I expect after the procedure? After the procedure, it is common to have:  Pain.  Soreness and numbness in your incision areas.  Vaginal bleeding and discharge.  Constipation.  Temporary problems emptying the bladder.  Feelings of sadness or other emotions. Follow these instructions at home: Medicines  Take over-the-counter and prescription medicines only as told by your health care provider.  If you were prescribed an antibiotic medicine, take it as told by your health care provider. Do not stop taking the antibiotic even if you start to feel better.  Do not drive or operate heavy machinery while taking prescription pain medicine. Activity  Return to your normal activities as told by your health care provider. Ask your health care provider what activities are safe for you.  Get regular exercise as told by your health care provider. You may be told to take short walks every day and go farther each time.  Do not lift anything that is heavier than 10 lb (4.5 kg). General instructions   Do not put anything in your vagina for 6 weeks after your surgery or as told by your health care provider. This includes tampons and douches.  Do not have sex until your health care provider says you can.  Do not take baths, swim, or use a hot tub until your health care provider approves.  Drink enough fluid to keep your urine clear or pale yellow.  Do not drive for 24 hours if you were given a sedative.  Keep all follow-up visits as told by your health care  provider. This is important. Contact a health care provider if:  Your pain medicine is not helping.  You have a fever.  You have redness, swelling, or pain at your incision site.  You have blood, pus, or a bad-smelling discharge from your vagina.  You continue to have difficulty urinating. Get help right away if:  You have severe abdominal or back pain.  You have heavy bleeding from your vagina.  You have chest pain or shortness of breath. This information is not intended to replace advice given to you by your health care provider. Make sure you discuss any questions you have with your health care provider. Document Released: 05/27/2015 Document Revised: 09/26/2015 Document Reviewed: 02/17/2015 Elsevier Patient Education  2020 [employer].

## 2018-12-15 ENCOUNTER — Encounter (HOSPITAL_COMMUNITY): Payer: Self-pay | Admitting: Obstetrics & Gynecology

## 2018-12-15 ENCOUNTER — Emergency Department (HOSPITAL_COMMUNITY): Payer: Medicare HMO

## 2018-12-15 ENCOUNTER — Emergency Department (HOSPITAL_COMMUNITY)
Admission: EM | Admit: 2018-12-15 | Discharge: 2018-12-16 | Disposition: A | Discharge status: Home / Self Care | Payer: Medicare HMO | Attending: Emergency Medicine | Admitting: Emergency Medicine

## 2018-12-15 ENCOUNTER — Telehealth: Payer: Self-pay | Admitting: *Deleted

## 2018-12-15 ENCOUNTER — Other Ambulatory Visit: Payer: Self-pay

## 2018-12-15 DIAGNOSIS — J45909 Unspecified asthma, uncomplicated: Secondary | ICD-10-CM | POA: Diagnosis not present

## 2018-12-15 DIAGNOSIS — M545 Low back pain: Secondary | ICD-10-CM | POA: Diagnosis not present

## 2018-12-15 DIAGNOSIS — Z79899 Other long term (current) drug therapy: Secondary | ICD-10-CM | POA: Diagnosis not present

## 2018-12-15 DIAGNOSIS — Z9889 Other specified postprocedural states: Secondary | ICD-10-CM | POA: Insufficient documentation

## 2018-12-15 DIAGNOSIS — R0789 Other chest pain: Secondary | ICD-10-CM | POA: Diagnosis present

## 2018-12-15 DIAGNOSIS — F17210 Nicotine dependence, cigarettes, uncomplicated: Secondary | ICD-10-CM | POA: Insufficient documentation

## 2018-12-15 DIAGNOSIS — R0781 Pleurodynia: Secondary | ICD-10-CM

## 2018-12-15 DIAGNOSIS — M546 Pain in thoracic spine: Secondary | ICD-10-CM | POA: Diagnosis not present

## 2018-12-15 LAB — URINALYSIS, ROUTINE W REFLEX MICROSCOPIC
Bacteria, UA: NONE SEEN
Bilirubin Urine: NEGATIVE
Glucose, UA: NEGATIVE mg/dL
Ketones, ur: NEGATIVE mg/dL
Nitrite: NEGATIVE
Protein, ur: NEGATIVE mg/dL
Specific Gravity, Urine: 1.015 (ref 1.005–1.030)
pH: 5 (ref 5.0–8.0)

## 2018-12-15 LAB — CBC WITH DIFFERENTIAL/PLATELET
Abs Immature Granulocytes: 0.08 10*3/uL — ABNORMAL HIGH (ref 0.00–0.07)
Basophils Absolute: 0 10*3/uL (ref 0.0–0.1)
Basophils Relative: 0 %
Eosinophils Absolute: 0.3 10*3/uL (ref 0.0–0.5)
Eosinophils Relative: 2 %
HCT: 42.1 % (ref 36.0–46.0)
Hemoglobin: 13.2 g/dL (ref 12.0–15.0)
Immature Granulocytes: 1 %
Lymphocytes Relative: 29 %
Lymphs Abs: 4.8 10*3/uL — ABNORMAL HIGH (ref 0.7–4.0)
MCH: 27 pg (ref 26.0–34.0)
MCHC: 31.4 g/dL (ref 30.0–36.0)
MCV: 86.1 fL (ref 80.0–100.0)
Monocytes Absolute: 0.5 10*3/uL (ref 0.1–1.0)
Monocytes Relative: 3 %
Neutro Abs: 10.9 10*3/uL — ABNORMAL HIGH (ref 1.7–7.7)
Neutrophils Relative %: 65 %
Platelets: 366 10*3/uL (ref 150–400)
RBC: 4.89 MIL/uL (ref 3.87–5.11)
RDW: 15.4 % (ref 11.5–15.5)
WBC: 16.6 10*3/uL — ABNORMAL HIGH (ref 4.0–10.5)
nRBC: 0 % (ref 0.0–0.2)

## 2018-12-15 LAB — COMPREHENSIVE METABOLIC PANEL
ALT: 17 U/L (ref 0–44)
AST: 15 U/L (ref 15–41)
Albumin: 3.8 g/dL (ref 3.5–5.0)
Alkaline Phosphatase: 78 U/L (ref 38–126)
Anion gap: 10 (ref 5–15)
BUN: 11 mg/dL (ref 6–20)
CO2: 23 mmol/L (ref 22–32)
Calcium: 8.8 mg/dL — ABNORMAL LOW (ref 8.9–10.3)
Chloride: 103 mmol/L (ref 98–111)
Creatinine, Ser: 0.72 mg/dL (ref 0.44–1.00)
GFR calc Af Amer: >60 mL/min (ref >60)
GFR calc non Af Amer: >60 mL/min (ref >60)
Glucose, Bld: 98 mg/dL (ref 70–99)
Potassium: 3.8 mmol/L (ref 3.5–5.1)
Sodium: 136 mmol/L (ref 135–145)
Total Bilirubin: 0.4 mg/dL (ref 0.3–1.2)
Total Protein: 7.5 g/dL (ref 6.5–8.1)

## 2018-12-15 LAB — D-DIMER, QUANTITATIVE: D-Dimer, Quant: <0.27 ug/mL-FEU (ref 0.00–0.50)

## 2018-12-15 LAB — SURGICAL PATHOLOGY

## 2018-12-15 MED ORDER — IOHEXOL 300 MG/ML  SOLN
100.0000 mL | Freq: Once | INTRAMUSCULAR | Status: AC | PRN
  Administered 2018-12-15: 100 mL via INTRAVENOUS

## 2018-12-15 MED ORDER — FENTANYL CITRATE (PF) 100 MCG/2ML IJ SOLN
50.0000 ug | Freq: Once | INTRAMUSCULAR | Status: AC
  Administered 2018-12-15: 50 ug via INTRAVENOUS
  Filled 2018-12-15: qty 2

## 2018-12-15 MED ORDER — OXYCODONE-ACETAMINOPHEN 5-325 MG PO TABS: 2.0000 | ORAL_TABLET | ORAL | 0 refills | Status: DC | PRN

## 2018-12-15 MED ORDER — IOHEXOL 9 MG/ML PO SOLN
ORAL | Status: AC
  Filled 2018-12-15: qty 1000

## 2018-12-15 NOTE — Discharge Instructions (Addendum)
The x-ray of your chest is negative for acute problems.  The x-ray of your ribs is negative for acute problems.  The CT scan of your abdomen and pelvis is also negative for acute changes.  Please continue to use your current medication for pain and discomfort.  May use Percocet 1 every 6 hours as needed for more severe pain.  Please call your primary physician, and/or your specialist on tomorrow and set up an office appointment to continue the evaluation concerning your pain. Return to the Emergency Dept if any emergent changes in your condition.

## 2018-12-15 NOTE — ED Provider Notes (Signed)
Pt's care to be continued from A. Law, PA-C  Patient is a 43 year old female who presents to the emergency department with pain in her rib cage.  The patient states that she had a surgical procedure done on yesterday.  She is having pain on her ribs that is not responding to Toradol.  Work-up is ongoing.  Patient has a CT scan pending.  CT scan is negative for acute changes. Vital signs are within normal limits.  Patient is drinking in the emergency department without problem.  I have asked the patient to see her primary physician and or her surgeon on tomorrow, or soon as possible concerning her discomfort.  Of asked the patient to return to the emergency department if any emergent changes in her condition.  Patient is in agreement with this plan.   Lily Kocher, PA-C 12/15/18 2347    Lucrezia Starch, MD 12/16/18 [phone]

## 2018-12-15 NOTE — ED Provider Notes (Signed)
[facility] EMERGENCY DEPARTMENT Provider Note   CSN: [id number] Arrival date & time: 12/15/18  1742     History   Chief Complaint Chief Complaint  Patient presents with  . Neck Pain  . Chest Pain    HPI Lisa Simpson is a 43 y.o. female with history of seizures, GERD, DVT, recent myomectomy yesterday who presents with severe rib pain and low back pain.  Patient denies any fever, chest pain, shortness of breath, significant abdominal pain, nausea, vomiting.  She reports having some soreness in her neck as well.  She was intubated.  She has significant pain with coughing or breathing deeply.     HPI  Past Medical History:  Diagnosis Date  . Asthma   . Borderline hypercholesterolemia   . Family history of anesthesia complication    "dad woke up during surgery"  . Gall stones 2014  . GERD (gastroesophageal reflux disease)   . Headache(784.0)    occasional  . Heart murmur   . Heart palpitations    "feels fluttery"  . History of kidney stones    2 currently  . Mastoiditis of left side 2015  . Pneumonia oct 2014   hx of  . Seizures ([place]) 2013   On meds since childhood, unknown etiology, No seizures for about 1 year.    Patient Active Problem List   Diagnosis Date Noted  . Mild intermittent asthma without complication 123XX123  . Laceration of left index finger without foreign body without damage to nail 09/01/2017  . History of deep vein thrombosis (DVT) of lower extremity 05/06/2017  . Neck pain 05/06/2017  . Referred otalgia of right ear 05/06/2017  . Cholecystitis with cholelithiasis 06/07/2013  . ALLERGIC RHINITIS 09/04/2009  . INSOMNIA 09/04/2009  . IMPAIRED GLUCOSE TOLERANCE 05/29/2009  . Tobacco use 05/29/2009  . OTITIS EXTERNA 05/29/2009  . ANXIETY DEPRESSION 05/09/2009  . CARPAL TUNNEL SYNDROME, BILATERAL 05/09/2009  . EXCESSIVE MENSTRUAL BLEEDING 05/09/2009  . ASTHMA, PERSISTENT, MILD 07/30/2008  . Morbid (severe) obesity due to excess calories  ([place]) 01/27/2008  . GERD (gastroesophageal reflux disease) 12/20/2007  . PANIC ATTACK 12/19/2007  . Hives 12/19/2007  . Seizures ([place]) 12/19/2007    Past Surgical History:  Procedure Laterality Date  . CHOLECYSTECTOMY N/A 06/28/2013   Procedure: LAPAROSCOPIC CHOLECYSTECTOMY WITH INTRAOPERATIVE CHOLANGIOGRAM;  Surgeon: Adin Hector, MD;  Location: [facility];  Service: General;  Laterality: N/A;  . MULTIPLE TOOTH EXTRACTIONS    . MYOMECTOMY N/A 12/14/2018   Procedure: VAGINAL REMOVAL OF PROLAPSED SUBMUCOSAL MYOMA;  Surgeon: Florian Buff, MD;  Location: [facility];  Service: Gynecology;  Laterality: N/A;  . TONSILLECTOMY     as child     OB History    Gravida  3   Para  3   Term      Preterm      AB      Living  3     SAB      TAB      Ectopic      Multiple      Live Births               Home Medications    Prior to Admission medications   Medication Sig Start Date End Date Taking? Authorizing Provider  acetaminophen (TYLENOL) 500 MG tablet Take 1,000 mg by mouth every 8 (eight) hours as needed for moderate pain.   Yes [provider]  albuterol (PROVENTIL HFA;VENTOLIN HFA) 108 (90 BASE) MCG/ACT inhaler Inhale  4 puffs into the lungs every 6 (six) hours as needed. For ease of Breathing   Yes [provider]  hydrOXYzine (ATARAX/VISTARIL) 25 MG tablet Take 25 mg by mouth every 8 (eight) hours as needed for anxiety.  08/30/17  Yes [provider]  ketorolac (TORADOL) 10 MG tablet Take 1 tablet (10 mg total) by mouth every 8 (eight) hours as needed. 12/14/18  Yes Florian Buff, MD  Multiple Vitamins-Minerals (MULTIVITAMIN WITH MINERALS) tablet Take 1 tablet by mouth daily.   Yes [provider]  naproxen sodium (ALEVE) 220 MG tablet Take 440 mg by mouth 2 (two) times daily as needed.    Yes [provider]  omeprazole (PRILOSEC) 40 MG capsule Take 40 mg by mouth daily.  08/30/17  Yes [provider]  phenytoin  (DILANTIN) 100 MG ER capsule Take 100 mg by mouth 2 (two) times daily.    Yes [provider]  promethazine (PHENERGAN) 25 MG tablet Take 1 tablet (25 mg total) by mouth every 6 (six) hours as needed for nausea or vomiting. 11/18/18  Yes Florian Buff, MD  oxyCODONE-acetaminophen (PERCOCET) 5-325 MG tablet Take 1 tablet by mouth every 4 (four) hours as needed for severe pain. 12/16/18   Florian Buff, MD  oxyCODONE-acetaminophen (PERCOCET/ROXICET) 5-325 MG tablet Take 2 tablets by mouth every 4 (four) hours as needed for severe pain. 12/15/18   Lily Kocher, PA-C    Family History Family History  Problem Relation Age of Onset  . Hypertension Mother   . Hypercholesterolemia Mother   . Heart failure Father   . Hypertension Sister   . Hypercholesterolemia Sister   . COPD Sister   . Cancer Brother     Social History Social History   Tobacco Use  . Smoking status: Current Every Day Smoker    Packs/day: 0.50    Years: 12.00    Pack years: 6.00    Types: Cigarettes  . Smokeless tobacco: Never Used  Substance Use Topics  . Alcohol use: No  . Drug use: No     Allergies   Diphenhydramine hcl, Doxycycline, Morphine, Hydrocodone-acetaminophen, Morphine and related, Other, and Vicodin [hydrocodone-acetaminophen]   Review of Systems Review of Systems  Constitutional: Negative for chills and fever.  HENT: Negative for facial swelling and sore throat.   Respiratory: Negative for shortness of breath.   Cardiovascular: Negative for chest pain.  Gastrointestinal: Negative for abdominal pain, nausea and vomiting.  Genitourinary: Negative for dysuria.  Musculoskeletal: Positive for back pain (rib pain).  Skin: Negative for rash and wound.  Neurological: Negative for headaches.  Psychiatric/Behavioral: The patient is not nervous/anxious.      Physical Exam Updated Vital Signs BP 127/62 (BP Location: Right Arm)   Pulse 70   Temp 98.4 F (36.9 C) (Oral)   Resp 17   Ht  5\' 2"  (1.575 m)   Wt 112 kg   LMP 12/04/2018 (Exact Date)   SpO2 95%   BMI 45.18 kg/m   Physical Exam Vitals signs and nursing note reviewed.  Constitutional:      General: She is not in acute distress.    Appearance: She is well-developed. She is not diaphoretic.  HENT:     Head: Normocephalic and atraumatic.     Mouth/Throat:     Pharynx: No oropharyngeal exudate.  Eyes:     General: No scleral icterus.       Right eye: No discharge.        Left eye:  No discharge.     Conjunctiva/sclera: Conjunctivae normal.     Pupils: Pupils are equal, round, and reactive to light.  Neck:     Musculoskeletal: Normal range of motion and neck supple.     Thyroid: No thyromegaly.  Cardiovascular:     Rate and Rhythm: Normal rate and regular rhythm.     Heart sounds: Normal heart sounds. No murmur. No friction rub. No gallop.   Pulmonary:     Effort: Pulmonary effort is normal. No respiratory distress.     Breath sounds: Normal breath sounds. No stridor. No wheezing or rales.  Chest:    Abdominal:     General: Bowel sounds are normal. There is no distension.     Palpations: Abdomen is soft.     Tenderness: There is no abdominal tenderness. There is no guarding or rebound.  Musculoskeletal:       Back:  Lymphadenopathy:     Cervical: No cervical adenopathy.  Skin:    General: Skin is warm and dry.     Coloration: Skin is not pale.     Findings: No rash.  Neurological:     Mental Status: She is alert.     Coordination: Coordination normal.      ED Treatments / Results  Labs (all labs ordered are listed, but only abnormal results are displayed) Labs Reviewed  COMPREHENSIVE METABOLIC PANEL - Abnormal; Notable for the following components:      Result Value   Calcium 8.8 (*)    All other components within normal limits  CBC WITH DIFFERENTIAL/PLATELET - Abnormal; Notable for the following components:   WBC 16.6 (*)    Neutro Abs 10.9 (*)    Lymphs Abs 4.8 (*)    Abs  Immature Granulocytes 0.08 (*)    All other components within normal limits  URINALYSIS, ROUTINE W REFLEX MICROSCOPIC - Abnormal; Notable for the following components:   APPearance HAZY (*)    Hgb urine dipstick SMALL (*)    Leukocytes,Ua TRACE (*)    All other components within normal limits  D-DIMER, QUANTITATIVE (NOT AT [facility])    EKG None  Radiology Dg Chest 2 View  Result Date: 12/15/2018 CLINICAL DATA:  Surgery with severe rib pain EXAM: CHEST - 2 VIEW COMPARISON:  10/11/2017 FINDINGS: The heart size and mediastinal contours are within normal limits. Both lungs are clear. The visualized skeletal structures are unremarkable. IMPRESSION: No active cardiopulmonary disease. Electronically Signed   By: Donavan Foil M.D.   On: 12/15/2018 20:43   Dg Ribs Bilateral  Result Date: 12/15/2018 CLINICAL DATA:  Rib pain after recent surgery EXAM: BILATERAL RIBS - 3+ VIEW COMPARISON:  None. FINDINGS: No fracture or other bone lesions are seen involving the ribs. IMPRESSION: Negative. Electronically Signed   By: Donavan Foil M.D.   On: 12/15/2018 20:44   Ct Abdomen Pelvis W Contrast  Result Date: 12/15/2018 CLINICAL DATA:  Uterine myomectomy yesterday with back pain, initial encounter EXAM: CT ABDOMEN AND PELVIS WITH CONTRAST TECHNIQUE: Multidetector CT imaging of the abdomen and pelvis was performed using the standard protocol following bolus administration of intravenous contrast. CONTRAST:  198mL OMNIPAQUE IOHEXOL 300 MG/ML  SOLN COMPARISON:  08/31/2012 FINDINGS: Lower chest: No acute abnormality. Hepatobiliary: Fatty infiltration of the liver is noted. The gallbladder has been surgically removed. Pancreas: Unremarkable. No pancreatic ductal dilatation or surrounding inflammatory changes. Spleen: Normal in size without focal abnormality. Adrenals/Urinary Tract: The adrenal glands are within normal limits. Kidneys demonstrate no renal calculi or  obstructive changes. Normal excretion of contrast  material is noted on delayed images. The bladder is well distended. Stomach/Bowel: The appendix is within normal limits. Colon is well visualized and within normal limits. No inflammatory changes are seen. Small bowel and stomach are within normal limits with the exception of a small sliding-type hiatal hernia. Vascular/Lymphatic: Aortic atherosclerosis. No enlarged abdominal or pelvic lymph nodes. Reproductive: Uterus is within normal limits. No acute changes of the ovaries are noted bilaterally stable from previous exam. Air is noted in the vaginal vault. Other: No abdominal wall hernia or abnormality. No abdominopelvic ascites. Musculoskeletal: No acute or significant osseous findings. IMPRESSION: Chronic changes are noted. No acute abnormality to correspond with the given clinical history is noted. Electronically Signed   By: Inez Catalina M.D.   On: 12/15/2018 22:39    Procedures Procedures (including critical care time)  Medications Ordered in ED Medications  iohexol (OMNIPAQUE) 300 MG/ML solution 100 mL (100 mLs Intravenous Contrast Given 12/15/18 2215)  fentaNYL (SUBLIMAZE) injection 50 mcg (50 mcg Intravenous Given 12/15/18 2303)     Initial Impression / Assessment and Plan / ED Course  I have reviewed the triage vital signs and the nursing notes.  Pertinent labs & imaging results that were available during my care of the patient were reviewed by me and considered in my medical decision making (see chart for details).        Patient presenting following myomectomy that was prolapsed with severe back and rib pain.  Spoke with OB/GYN surgeon Dr. Elonda Husky who advised to be very unlikely for any post op complication regarding air or hematoma considering the surgery only involved a clamp and one suture outside of the cervical os.  Patient has history of DVT, so D-dimer was obtained which was negative.  Labs otherwise unremarkable.  At shift change, CT abdomen pelvis is pending. At shift change,  patient care transferred to [facility], PA-C for continued evaluation, follow up of CTAP and determination of disposition.  Anticipate discharge with pain medication for probably musculoskeletal pain if CT negative.   Final Clinical Impressions(s) / ED Diagnoses   Final diagnoses:  Rib pain    ED Discharge Orders         Ordered    oxyCODONE-acetaminophen (PERCOCET/ROXICET) 5-325 MG tablet  Every 4 hours PRN     12/15/18 2357           Frederica Kuster, PA-C 12/16/18 1501    Lucrezia Starch, MD 12/16/18 [phone]

## 2018-12-15 NOTE — Telephone Encounter (Signed)
Patient called stating she is taking the Tramadol as prescribed but it is not helping.  She is requesting some stronger pain medication.

## 2018-12-15 NOTE — ED Triage Notes (Signed)
States she had surgery yesterday and is having pain in her rib cage, states she is not getting relief from the toradol that was prescribed.

## 2018-12-16 ENCOUNTER — Telehealth: Payer: Self-pay | Admitting: Obstetrics & Gynecology

## 2018-12-16 MED ORDER — OXYCODONE-ACETAMINOPHEN 5-325 MG PO TABS: 1.0000 | ORAL_TABLET | ORAL | 0 refills | Status: DC | PRN

## 2018-12-16 NOTE — ED Notes (Signed)
Pt ambulatory to waiting room. Pt verbalized understanding of discharge instructions.   

## 2018-12-16 NOTE — Telephone Encounter (Signed)
See note

## 2018-12-19 MED FILL — Oxycodone w/ Acetaminophen Tab 5-325 MG: ORAL | Qty: 6 | Status: AC

## 2018-12-23 ENCOUNTER — Other Ambulatory Visit: Payer: Self-pay

## 2018-12-23 ENCOUNTER — Encounter: Payer: Self-pay | Admitting: Obstetrics & Gynecology

## 2018-12-23 ENCOUNTER — Ambulatory Visit (INDEPENDENT_AMBULATORY_CARE_PROVIDER_SITE_OTHER): Payer: Medicare HMO | Admitting: Obstetrics & Gynecology

## 2018-12-23 VITALS — BP 138/81 | HR 88 | Ht 62.0 in | Wt 247.5 lb

## 2018-12-23 DIAGNOSIS — Z9889 Other specified postprocedural states: Secondary | ICD-10-CM

## 2018-12-23 NOTE — Progress Notes (Signed)
  HPI: Patient returns for routine postoperative follow-up having undergone vaginal approach removal of a prolapsed myoma on 12/14/2018.  The patient's immediate postoperative recovery has been unremarkable. Since hospital discharge the patient reports no problems.   Current Outpatient Medications: acetaminophen (TYLENOL) 500 MG tablet, Take 1,000 mg by mouth every 8 (eight) hours as needed for moderate pain., Disp: , Rfl:  albuterol (PROVENTIL HFA;VENTOLIN HFA) 108 (90 BASE) MCG/ACT inhaler, Inhale 4 puffs into the lungs every 6 (six) hours as needed. For ease of Breathing, Disp: , Rfl:  hydrOXYzine (ATARAX/VISTARIL) 25 MG tablet, Take 25 mg by mouth every 8 (eight) hours as needed for anxiety. , Disp: , Rfl:  Multiple Vitamins-Minerals (MULTIVITAMIN WITH MINERALS) tablet, Take 1 tablet by mouth daily., Disp: , Rfl:  naproxen sodium (ALEVE) 220 MG tablet, Take 440 mg by mouth 2 (two) times daily as needed. , Disp: , Rfl:  omeprazole (PRILOSEC) 40 MG capsule, Take 40 mg by mouth daily. , Disp: , Rfl:  phenytoin (DILANTIN) 100 MG ER capsule, Take 100 mg by mouth 3 (three) times daily. , Disp: , Rfl:  promethazine (PHENERGAN) 25 MG tablet, Take 1 tablet (25 mg total) by mouth every 6 (six) hours as needed for nausea or vomiting., Disp: 30 tablet, Rfl: 1 oxyCODONE-acetaminophen (PERCOCET) 5-325 MG tablet, Take 1 tablet by mouth every 4 (four) hours as needed for severe pain. (Patient not taking: Reported on 12/23/2018), Disp: 15 tablet, Rfl: 0  No current facility-administered medications for this visit.     Blood pressure 138/81, pulse 88, height 5\' 2"  (1.575 m), weight 247 lb 8 oz (112.3 kg), last menstrual period 12/04/2018.  Physical Exam: Normal post vaginal approach myomectomy  Diagnostic Tests:   Pathology: Benign myoma  Impression: S/p vaginal myomectomy  Plan:   Follow up: prn    Florian Buff, MD

## 2019-02-27 ENCOUNTER — Ambulatory Visit: Payer: Medicare HMO | Attending: Internal Medicine

## 2019-02-28 ENCOUNTER — Ambulatory Visit: Payer: Medicare HMO | Attending: Internal Medicine

## 2019-02-28 ENCOUNTER — Other Ambulatory Visit: Payer: Self-pay

## 2019-02-28 DIAGNOSIS — Z20822 Contact with and (suspected) exposure to covid-19: Secondary | ICD-10-CM

## 2019-03-01 ENCOUNTER — Telehealth: Payer: Self-pay | Admitting: *Deleted

## 2019-03-01 NOTE — Telephone Encounter (Signed)
Pt advised that her covid 19 test results are not back. She voiced understanding.

## 2019-03-01 NOTE — Telephone Encounter (Signed)
Calling for ovid 19 results. None available at this time. Referred patient to view on MyChart later today or tomorrow.

## 2019-03-02 LAB — NOVEL CORONAVIRUS, NAA: SARS-CoV-2, NAA: NOT DETECTED

## 2019-03-08 ENCOUNTER — Other Ambulatory Visit: Payer: Self-pay

## 2019-03-08 ENCOUNTER — Ambulatory Visit: Payer: Medicare HMO | Attending: Internal Medicine

## 2019-03-08 DIAGNOSIS — Z20822 Contact with and (suspected) exposure to covid-19: Secondary | ICD-10-CM

## 2019-03-09 LAB — NOVEL CORONAVIRUS, NAA: SARS-CoV-2, NAA: NOT DETECTED

## 2019-05-28 ENCOUNTER — Emergency Department (HOSPITAL_COMMUNITY): Payer: Medicare HMO

## 2019-05-28 ENCOUNTER — Other Ambulatory Visit: Payer: Self-pay

## 2019-05-28 ENCOUNTER — Encounter (HOSPITAL_COMMUNITY): Payer: Self-pay | Admitting: Emergency Medicine

## 2019-05-28 ENCOUNTER — Emergency Department (HOSPITAL_COMMUNITY)
Admission: EM | Admit: 2019-05-28 | Discharge: 2019-05-28 | Disposition: A | Discharge status: Home / Self Care | Payer: Medicare HMO | Attending: Emergency Medicine | Admitting: Emergency Medicine

## 2019-05-28 DIAGNOSIS — X500XXA Overexertion from strenuous movement or load, initial encounter: Secondary | ICD-10-CM | POA: Insufficient documentation

## 2019-05-28 DIAGNOSIS — D171 Benign lipomatous neoplasm of skin and subcutaneous tissue of trunk: Secondary | ICD-10-CM | POA: Diagnosis not present

## 2019-05-28 DIAGNOSIS — Z79899 Other long term (current) drug therapy: Secondary | ICD-10-CM | POA: Insufficient documentation

## 2019-05-28 DIAGNOSIS — S29011A Strain of muscle and tendon of front wall of thorax, initial encounter: Secondary | ICD-10-CM | POA: Diagnosis not present

## 2019-05-28 DIAGNOSIS — J45909 Unspecified asthma, uncomplicated: Secondary | ICD-10-CM | POA: Insufficient documentation

## 2019-05-28 DIAGNOSIS — F17210 Nicotine dependence, cigarettes, uncomplicated: Secondary | ICD-10-CM | POA: Diagnosis not present

## 2019-05-28 DIAGNOSIS — Y999 Unspecified external cause status: Secondary | ICD-10-CM | POA: Insufficient documentation

## 2019-05-28 DIAGNOSIS — Y93B3 Activity, free weights: Secondary | ICD-10-CM | POA: Insufficient documentation

## 2019-05-28 DIAGNOSIS — Y9239 Other specified sports and athletic area as the place of occurrence of the external cause: Secondary | ICD-10-CM | POA: Insufficient documentation

## 2019-05-28 DIAGNOSIS — S299XXA Unspecified injury of thorax, initial encounter: Secondary | ICD-10-CM | POA: Diagnosis present

## 2019-05-28 MED ORDER — IBUPROFEN 600 MG PO TABS: 600.0000 mg | ORAL_TABLET | Freq: Four times a day (QID) | ORAL | 0 refills | Status: DC | PRN

## 2019-05-28 NOTE — ED Triage Notes (Signed)
Reports abd pain x 4   Here with spouse also a patient   Relief with aleive   Last BM yeaterday N

## 2019-05-28 NOTE — Discharge Instructions (Signed)
Your chest xray is clear and there is no evidence for rib injury.  Your exam suggests muscle strain, possibly from your increased workout's and exercise.  I recommend applying a heating pad to your chest wall for 20 minutes several times daily in addition to taking an anti inflammatory pain reliever.  You should consider having your lipoma excised since it causes you discomfort.  A general surgeon can do this for you - I have given you the name of a surgeon on call for [place] today, however, you may need this referral to come from your primary provider.

## 2019-05-28 NOTE — ED Provider Notes (Signed)
[facility] EMERGENCY DEPARTMENT Provider Note   CSN: [id number] Arrival date & time: 05/28/19  1152     History Chief Complaint  Patient presents with  . Abdominal Pain    Lisa Simpson is a 44 y.o. female presenting with left rib cage pain which is been present for approximately 4 days.  She describes recently starting a workout routine at a local gym and has been lifting weights and generally being more active than normal.  She noticed tenderness to palpation just beneath her left breast, also triggered by upper arm stretching and twisting of her torso.  She denies shortness of breath, cough, also no fevers or chills.  She also denies nausea vomiting or abdominal pain, despite the initial chief complaint as documented upon first presentation.  She also has complaints of localized pain in her right lateral back/flank area associated with a mobile nodule.  She has been told that this is a lipoma but she describes tenderness and discomfort especially at night when she is sleeping and trying to lie on her back.  She has taken Aleve with significant but transient improvement in her symptoms.  HPI     Past Medical History:  Diagnosis Date  . Asthma   . Borderline hypercholesterolemia   . Family history of anesthesia complication    "dad woke up during surgery"  . Gall stones 2014  . GERD (gastroesophageal reflux disease)   . Headache(784.0)    occasional  . Heart murmur   . Heart palpitations    "feels fluttery"  . History of kidney stones    2 currently  . Mastoiditis of left side 2015  . Pneumonia oct 2014   hx of  . Seizures ([place]) 2013   On meds since childhood, unknown etiology, No seizures for about 1 year.    Patient Active Problem List   Diagnosis Date Noted  . Mild intermittent asthma without complication [id number]  . Laceration of left index finger without foreign body without damage to nail 09/01/2017  . History of deep vein thrombosis (DVT) of lower extremity  05/06/2017  . Neck pain 05/06/2017  . Referred otalgia of right ear 05/06/2017  . Cholecystitis with cholelithiasis 06/07/2013  . ALLERGIC RHINITIS 09/04/2009  . INSOMNIA 09/04/2009  . IMPAIRED GLUCOSE TOLERANCE 05/29/2009  . Tobacco use 05/29/2009  . OTITIS EXTERNA 05/29/2009  . ANXIETY DEPRESSION 05/09/2009  . CARPAL TUNNEL SYNDROME, BILATERAL 05/09/2009  . EXCESSIVE MENSTRUAL BLEEDING 05/09/2009  . ASTHMA, PERSISTENT, MILD 07/30/2008  . Morbid (severe) obesity due to excess calories ([place]) 01/27/2008  . GERD (gastroesophageal reflux disease) 12/20/2007  . PANIC ATTACK 12/19/2007  . Hives 12/19/2007  . Seizures ([place]) 12/19/2007    Past Surgical History:  Procedure Laterality Date  . CHOLECYSTECTOMY N/A 06/28/2013   Procedure: LAPAROSCOPIC CHOLECYSTECTOMY WITH INTRAOPERATIVE CHOLANGIOGRAM;  Surgeon: Adin Hector, MD;  Location: [facility];  Service: General;  Laterality: N/A;  . MULTIPLE TOOTH EXTRACTIONS    . MYOMECTOMY N/A 12/14/2018   Procedure: VAGINAL REMOVAL OF PROLAPSED SUBMUCOSAL MYOMA;  Surgeon: Florian Buff, MD;  Location: [facility];  Service: Gynecology;  Laterality: N/A;  . TONSILLECTOMY     as child     OB History    Gravida  4   Para  3   Term      Preterm      AB  1   Living  3     SAB  1   TAB      Ectopic  Multiple      Live Births              Family History  Problem Relation Age of Onset  . Hypertension Mother   . Hypercholesterolemia Mother   . Heart failure Father   . Hypertension Sister   . Hypercholesterolemia Sister   . COPD Sister   . Cancer Brother   . Miscarriages / [place] Daughter   . Miscarriages / [place] Daughter     Social History   Tobacco Use  . Smoking status: Current Every Day Smoker    Packs/day: 0.50    Years: 12.00    Pack years: 6.00    Types: Cigarettes  . Smokeless tobacco: Never Used  Substance Use Topics  . Alcohol use: No  . Drug use: No    Home Medications Prior to  Admission medications   Medication Sig Start Date End Date Taking? Authorizing Provider  acetaminophen (TYLENOL) 500 MG tablet Take 1,000 mg by mouth every 8 (eight) hours as needed for moderate pain.   Yes [provider]  hydrOXYzine (ATARAX/VISTARIL) 25 MG tablet Take 25 mg by mouth every 8 (eight) hours as needed for anxiety.  08/30/17  Yes [provider]  naproxen sodium (ALEVE) 220 MG tablet Take 440 mg by mouth 2 (two) times daily as needed.    Yes [provider]  omeprazole (PRILOSEC) 40 MG capsule Take 40 mg by mouth daily.  08/30/17  Yes [provider]  phenytoin (DILANTIN) 100 MG ER capsule Take 100 mg by mouth 3 (three) times daily.    Yes [provider]  albuterol (PROVENTIL HFA;VENTOLIN HFA) 108 (90 BASE) MCG/ACT inhaler Inhale 4 puffs into the lungs every 6 (six) hours as needed. For ease of Breathing    [provider]  ibuprofen (ADVIL) 600 MG tablet Take 1 tablet (600 mg total) by mouth every 6 (six) hours as needed. 05/28/19   Evalee Jefferson, PA-C  oxyCODONE-acetaminophen (PERCOCET) 5-325 MG tablet Take 1 tablet by mouth every 4 (four) hours as needed for severe pain. Patient not taking: Reported on 12/23/2018 12/16/18   Florian Buff, MD  promethazine (PHENERGAN) 25 MG tablet Take 1 tablet (25 mg total) by mouth every 6 (six) hours as needed for nausea or vomiting. Patient not taking: Reported on 05/28/2019 11/18/18   Florian Buff, MD    Allergies    Diphenhydramine hcl, Doxycycline, Morphine, Hydrocodone-acetaminophen, Morphine and related, Other, and Vicodin [hydrocodone-acetaminophen]  Review of Systems   Review of Systems  Constitutional: Negative for chills and fever.  HENT: Negative for congestion.   Eyes: Negative.   Respiratory: Negative for cough, chest tightness, shortness of breath, wheezing and stridor.   Cardiovascular: Positive for chest pain.  Gastrointestinal: Negative for abdominal pain, constipation,  diarrhea, nausea and vomiting.  Genitourinary: Negative.   Musculoskeletal: Negative for arthralgias, joint swelling and neck pain.  Skin: Negative.  Negative for rash and wound.  Neurological: Negative for dizziness, weakness, light-headedness, numbness and headaches.  Psychiatric/Behavioral: Negative.     Physical Exam Updated Vital Signs BP 127/74 (BP Location: Right Arm)   Pulse 92   Temp 98.1 F (36.7 C) (Oral)   Resp 18   Ht 5\' 8"  (1.727 m)   Wt 113.4 kg   LMP 05/16/2019 (Approximate)   BMI 38.01 kg/m   Physical Exam Vitals and nursing note reviewed.  Constitutional:      Appearance: She is well-developed.  HENT:     Head: Normocephalic and  atraumatic.  Eyes:     Conjunctiva/sclera: Conjunctivae normal.  Cardiovascular:     Rate and Rhythm: Normal rate and regular rhythm.     Heart sounds: Normal heart sounds.  Pulmonary:     Effort: Pulmonary effort is normal.     Breath sounds: Normal breath sounds. No wheezing.  Chest:       Comments: Patient has localized tenderness to palpation the left anterior chest wall inferior to her breast.  There is no edema, no crepitus.  CTA B. Abdominal:     General: Abdomen is protuberant. Bowel sounds are normal.     Palpations: Abdomen is soft.     Tenderness: There is no abdominal tenderness. There is no guarding or rebound.  Musculoskeletal:        General: Normal range of motion.     Cervical back: Normal range of motion.  Skin:    General: Skin is warm and dry.     Comments: There is an approximate 2 cm soft mobile nodule within the subcutaneous layer at her right flank.  Skin is normal, no induration or erythema.  Neurological:     Mental Status: She is alert.     ED Results / Procedures / Treatments   Labs (all labs ordered are listed, but only abnormal results are displayed) Labs Reviewed - No data to display  EKG None  Radiology DG Ribs Unilateral W/Chest Left  Result Date: 05/28/2019 CLINICAL DATA:   Anterior left rib pain. No known injury. EXAM: LEFT RIBS AND CHEST - 3+ VIEW COMPARISON:  Radiographs 12/15/2018 FINDINGS: The heart size and mediastinal contours are stable. The lungs appear clear. There is no pleural effusion or pneumothorax. No rib fracture or focal rib lesion identified. Cholecystectomy clips noted. IMPRESSION: No active cardiopulmonary or rib abnormality identified. Electronically Signed   By: Richardean Sale M.D.   On: 05/28/2019 14:38    Procedures Procedures (including critical care time)  Medications Ordered in ED Medications - No data to display  ED Course  I have reviewed the triage vital signs and the nursing notes.  Pertinent labs & imaging results that were available during my care of the patient were reviewed by me and considered in my medical decision making (see chart for details).    MDM Rules/Calculators/A&P                      Exam and history suggesting chest wall strain.  She has no shortness of breath, no tachycardia.  Chest pain is reproducible and most suggestive of musculoskeletal strain.  ACS, PE, pneumonia unlikely.  Chest x-ray is reassuring.  Advised heat therapy in addition to continued NSAIDs.  She was given referral to our general surgeon for further evaluation management of her lipoma.  Since it is symptomatic,  Reasonable to consider excision.    Final Clinical Impression(s) / ED Diagnoses Final diagnoses:  Muscle strain of chest wall, initial encounter  Lipoma of torso    Rx / DC Orders ED Discharge Orders         Ordered    ibuprofen (ADVIL) 600 MG tablet  Every 6 hours PRN     05/28/19 1536           Evalee Jefferson, PA-C 05/28/19 1617    Fredia Sorrow, MD 05/31/19 1718

## 2019-12-28 ENCOUNTER — Telehealth: Payer: Self-pay

## 2020-01-04 ENCOUNTER — Ambulatory Visit: Payer: Medicare Other | Admitting: Family Medicine

## 2020-01-25 ENCOUNTER — Ambulatory Visit: Payer: Medicare Other | Admitting: Family Medicine

## 2020-02-06 ENCOUNTER — Ambulatory Visit: Payer: Medicare Other | Admitting: Family Medicine

## 2020-03-14 ENCOUNTER — Ambulatory Visit (INDEPENDENT_AMBULATORY_CARE_PROVIDER_SITE_OTHER): Payer: Medicare HMO | Admitting: Family Medicine

## 2020-03-14 ENCOUNTER — Encounter: Payer: Self-pay | Admitting: Family Medicine

## 2020-03-14 ENCOUNTER — Ambulatory Visit: Payer: Medicare Other | Admitting: Family Medicine

## 2020-03-14 DIAGNOSIS — Z720 Tobacco use: Secondary | ICD-10-CM | POA: Diagnosis not present

## 2020-03-14 DIAGNOSIS — J4540 Moderate persistent asthma, uncomplicated: Secondary | ICD-10-CM

## 2020-03-14 DIAGNOSIS — G479 Sleep disorder, unspecified: Secondary | ICD-10-CM | POA: Diagnosis not present

## 2020-03-14 DIAGNOSIS — G5603 Carpal tunnel syndrome, bilateral upper limbs: Secondary | ICD-10-CM | POA: Diagnosis not present

## 2020-03-14 DIAGNOSIS — E782 Mixed hyperlipidemia: Secondary | ICD-10-CM | POA: Diagnosis not present

## 2020-03-14 DIAGNOSIS — Z1231 Encounter for screening mammogram for malignant neoplasm of breast: Secondary | ICD-10-CM

## 2020-03-14 DIAGNOSIS — L509 Urticaria, unspecified: Secondary | ICD-10-CM

## 2020-03-14 DIAGNOSIS — R69 Illness, unspecified: Secondary | ICD-10-CM | POA: Diagnosis not present

## 2020-03-14 DIAGNOSIS — K219 Gastro-esophageal reflux disease without esophagitis: Secondary | ICD-10-CM | POA: Diagnosis not present

## 2020-03-14 DIAGNOSIS — F410 Panic disorder [episodic paroxysmal anxiety] without agoraphobia: Secondary | ICD-10-CM

## 2020-03-14 MED ORDER — HYDROXYZINE HCL 25 MG PO TABS: 25.0000 mg | ORAL_TABLET | Freq: Three times a day (TID) | ORAL | 5 refills | Status: DC | PRN

## 2020-03-14 MED ORDER — PREDNISONE 10 MG (21) PO TBPK: ORAL_TABLET | ORAL | 0 refills | Status: DC

## 2020-03-14 MED ORDER — OMEPRAZOLE 40 MG PO CPDR: 40.0000 mg | DELAYED_RELEASE_CAPSULE | Freq: Two times a day (BID) | ORAL | 5 refills | Status: DC

## 2020-03-14 MED ORDER — ALBUTEROL SULFATE HFA 108 (90 BASE) MCG/ACT IN AERS: 2.0000 | INHALATION_SPRAY | Freq: Four times a day (QID) | RESPIRATORY_TRACT | 5 refills | Status: DC | PRN

## 2020-03-14 MED ORDER — TRAZODONE HCL 50 MG PO TABS: 50.0000 mg | ORAL_TABLET | Freq: Every evening | ORAL | 2 refills | Status: DC | PRN

## 2020-03-14 MED ORDER — FLOVENT HFA 44 MCG/ACT IN AERO: 2.0000 | INHALATION_SPRAY | Freq: Two times a day (BID) | RESPIRATORY_TRACT | 5 refills | Status: DC

## 2020-03-14 NOTE — Progress Notes (Signed)
Virtual Visit via Telephone Note  I connected with Lisa Simpson on 03/14/20 at 11:03 AM by telephone and verified that I am speaking with the correct person using two identifiers. Lisa Simpson is currently located at home and her mother is currently with her during this visit. The provider, Loman Brooklyn, FNP is located in their home at time of visit.  I discussed the limitations, risks, security and privacy concerns of performing an evaluation and management service by telephone and the availability of in person appointments. I also discussed with the patient that there may be a patient responsible charge related to this service. The patient expressed understanding and agreed to proceed.  Subjective: PCP: Loman Brooklyn, FNP  Chief Complaint  Patient presents with  . Establish Care   Asthma: patient is needing Albuterol twice daily. She does not have a maintenance inhaler.   GERD: taking omeprazole BID which works well.  Bilateral Carpal Tunnel Syndrome: patient reports her left wrist is giving her a fit. She does not have a wrist splint. She is not ready to be referred to an orthopedic.   Hives: occasionally develops hives that is relieved with hydroxyzine.  Seizures: patient reports she has been out of her Dilantin x3 months. Her last reported seizure was two years ago in a restaurant and the one before that was when her father died 8 years ago. She reports every seizure she has ever had was when she was upset. She states she can feel them coming and is often able to calm herself down so they don't occur.   Depression screen [facility] 2/9 03/14/2020  Decreased Interest 0  Down, Depressed, Hopeless 0  PHQ - 2 Score 0  Altered sleeping 3  Tired, decreased energy 1  Change in appetite 1  Feeling bad or failure about yourself  0  Trouble concentrating 0  Moving slowly or fidgety/restless 0  Suicidal thoughts 0  PHQ-9 Score 5  Difficult doing work/chores Not difficult at all    GAD 7 : Generalized Anxiety Score 03/14/2020  Nervous, Anxious, on Edge 1  Control/stop worrying 3  Worry too much - different things 0  Trouble relaxing 0  Restless 0  Easily annoyed or irritable 0  Afraid - awful might happen 0  Total GAD 7 Score 4    Difficulty Sleeping: tried and failed melatonin.    ROS: Per HPI  Current Outpatient Medications:  .  acetaminophen (TYLENOL) 500 MG tablet, Take 1,000 mg by mouth every 8 (eight) hours as needed for moderate pain., Disp: , Rfl:  .  albuterol (PROVENTIL HFA;VENTOLIN HFA) 108 (90 BASE) MCG/ACT inhaler, Inhale 4 puffs into the lungs every 6 (six) hours as needed. For ease of Breathing, Disp: , Rfl:  .  hydrOXYzine (ATARAX/VISTARIL) 25 MG tablet, Take 25 mg by mouth every 8 (eight) hours as needed for anxiety. , Disp: , Rfl:  .  ibuprofen (ADVIL) 600 MG tablet, Take 1 tablet (600 mg total) by mouth every 6 (six) hours as needed., Disp: 20 tablet, Rfl: 0 .  naproxen sodium (ALEVE) 220 MG tablet, Take 440 mg by mouth 2 (two) times daily as needed. , Disp: , Rfl:  .  omeprazole (PRILOSEC) 40 MG capsule, Take 40 mg by mouth daily. , Disp: , Rfl:  .  oxyCODONE-acetaminophen (PERCOCET) 5-325 MG tablet, Take 1 tablet by mouth every 4 (four) hours as needed for severe pain. (Patient not taking: Reported on 12/23/2018), Disp: 15 tablet, Rfl: 0 .  phenytoin (DILANTIN) 100 MG ER capsule, Take 100 mg by mouth 3 (three) times daily. , Disp: , Rfl:  .  promethazine (PHENERGAN) 25 MG tablet, Take 1 tablet (25 mg total) by mouth every 6 (six) hours as needed for nausea or vomiting. (Patient not taking: Reported on 05/28/2019), Disp: 30 tablet, Rfl: 1  Allergies  Allergen Reactions  . Diphenhydramine Hcl Palpitations and Shortness Of Breath  . Doxycycline Anaphylaxis    Throat swelling   . Morphine Anaphylaxis  . Hydrocodone-Acetaminophen Itching    Patient stated causes itching and whelps  . Morphine And Related     "made me crazy"  . Other      "made me crazy"  . Vicodin [Hydrocodone-Acetaminophen] Itching    Patient stated causes itching and whelps   Past Medical History:  Diagnosis Date  . Asthma   . Borderline hypercholesterolemia   . Family history of anesthesia complication    "dad woke up during surgery"  . Gall stones 2014  . GERD (gastroesophageal reflux disease)   . Headache(784.0)    occasional  . Heart murmur   . Heart palpitations    "feels fluttery"  . History of kidney stones    2 currently  . Mastoiditis of left side 2015  . Pneumonia oct 2014   hx of  . Seizures ([place]) 2013   On meds since childhood, unknown etiology, No seizures for about 1 year.    Observations/Objective: A&O  No respiratory distress or wheezing audible over the phone Mood, judgement, and thought processes all WNL  Assessment and Plan: 1. Moderate persistent asthma without complication - Uncontrolled. Started Flovent BID. - albuterol (VENTOLIN HFA) 108 (90 Base) MCG/ACT inhaler; Inhale 2 puffs into the lungs every 6 (six) hours as needed for wheezing or shortness of breath.  Dispense: 18 g; Refill: 5 - fluticasone (FLOVENT HFA) 44 MCG/ACT inhaler; Inhale 2 puffs into the lungs 2 (two) times daily.  Dispense: 1 each; Refill: 5 - CBC with Differential/Platelet; Future  2. Gastroesophageal reflux disease, unspecified whether esophagitis present - Well controlled on current regimen.  - omeprazole (PRILOSEC) 40 MG capsule; Take 1 capsule (40 mg total) by mouth 2 (two) times daily.  Dispense: 60 capsule; Refill: 5 - CMP14+EGFR; Future  3. Bilateral carpal tunnel syndrome - Uncontrolled. Predisone taper rx'd today. Offered referral to orthopedic - patient declined at this time. - predniSONE (STERAPRED UNI-PAK 21 TAB) 10 MG (21) TBPK tablet; As directed x 6 days  Dispense: 21 tablet; Refill: 0  4. Hives - Well controlled on current regimen.  - hydrOXYzine (ATARAX/VISTARIL) 25 MG tablet; Take 1 tablet (25 mg total) by mouth every 8  (eight) hours as needed for itching.  Dispense: 30 tablet; Refill: 5  5. Tobacco use - CBC with Differential/Platelet; Future - CMP14+EGFR; Future - Lipid panel; Future  6. Panic attack - Discussed that she could benefit from something to take when she feels them coming on as this is when she is having what she perceives as a seizure.   7. Difficulty sleeping - Uncontrolled. Rx'd Trazodone. - traZODone (DESYREL) 50 MG tablet; Take 1-2 tablets (50-100 mg total) by mouth at bedtime as needed for sleep.  Dispense: 30 tablet; Refill: 2  8. Mixed hyperlipidemia - Labs to assess. - Lipid panel; Future  9. Encounter for screening mammogram for malignant neoplasm of breast - MM DIGITAL SCREENING BILATERAL; Future   Follow Up Instructions: Return in about 6 weeks (around 04/25/2020) for sleep & anxiety.  I  discussed the assessment and treatment plan with the patient. The patient was provided an opportunity to ask questions and all were answered. The patient agreed with the plan and demonstrated an understanding of the instructions.   The patient was advised to call back or seek an in-person evaluation if the symptoms worsen or if the condition fails to improve as anticipated.  The above assessment and management plan was discussed with the patient. The patient verbalized understanding of and has agreed to the management plan. Patient is aware to call the clinic if symptoms persist or worsen. Patient is aware when to return to the clinic for a follow-up visit. Patient educated on when it is appropriate to go to the emergency department.   Time call ended: 11:45 AM  I provided 44 minutes of non-face-to-face time during this encounter.  Hendricks Limes, MSN, APRN, FNP-C [facility] 03/14/20

## 2020-03-15 DIAGNOSIS — M79603 Pain in arm, unspecified: Secondary | ICD-10-CM | POA: Diagnosis not present

## 2020-03-17 DIAGNOSIS — G479 Sleep disorder, unspecified: Secondary | ICD-10-CM | POA: Insufficient documentation

## 2020-03-21 ENCOUNTER — Ambulatory Visit: Payer: Medicare HMO | Admitting: Family Medicine

## 2020-06-12 ENCOUNTER — Encounter: Payer: Self-pay | Admitting: Family Medicine

## 2020-06-12 ENCOUNTER — Ambulatory Visit (INDEPENDENT_AMBULATORY_CARE_PROVIDER_SITE_OTHER): Payer: Medicare HMO | Admitting: Family Medicine

## 2020-06-12 ENCOUNTER — Ambulatory Visit (INDEPENDENT_AMBULATORY_CARE_PROVIDER_SITE_OTHER): Payer: Medicare HMO

## 2020-06-12 ENCOUNTER — Other Ambulatory Visit: Payer: Self-pay

## 2020-06-12 VITALS — BP 119/68 | HR 64 | Temp 97.9°F | Ht 68.0 in | Wt 234.4 lb

## 2020-06-12 DIAGNOSIS — J302 Other seasonal allergic rhinitis: Secondary | ICD-10-CM | POA: Diagnosis not present

## 2020-06-12 DIAGNOSIS — R229 Localized swelling, mass and lump, unspecified: Secondary | ICD-10-CM

## 2020-06-12 DIAGNOSIS — S30861A Insect bite (nonvenomous) of abdominal wall, initial encounter: Secondary | ICD-10-CM | POA: Diagnosis not present

## 2020-06-12 DIAGNOSIS — W57XXXA Bitten or stung by nonvenomous insect and other nonvenomous arthropods, initial encounter: Secondary | ICD-10-CM | POA: Diagnosis not present

## 2020-06-12 DIAGNOSIS — M25512 Pain in left shoulder: Secondary | ICD-10-CM

## 2020-06-12 MED ORDER — FLUTICASONE PROPIONATE 50 MCG/ACT NA SUSP: 2.0000 | Freq: Every day | NASAL | 6 refills | Status: DC

## 2020-06-12 MED ORDER — PREDNISONE 10 MG (21) PO TBPK: ORAL_TABLET | ORAL | 0 refills | Status: DC

## 2020-06-12 MED ORDER — MELOXICAM 7.5 MG PO TABS: 7.5000 mg | ORAL_TABLET | Freq: Every day | ORAL | 2 refills | Status: DC

## 2020-06-12 MED ORDER — LEVOCETIRIZINE DIHYDROCHLORIDE 5 MG PO TABS: 5.0000 mg | ORAL_TABLET | Freq: Every evening | ORAL | 2 refills | Status: DC

## 2020-06-12 NOTE — Progress Notes (Signed)
Assessment & Plan:  1. Tick bite of abdomen, initial encounter Reassurance provided. Discussed symptoms of alpha gal to look for as patient described the lone star tick.   2. Seasonal allergies Uncontrolled. Rx'd Flonase and Xyzal.  - fluticasone (FLONASE) 50 MCG/ACT nasal spray; Place 2 sprays into both nostrils daily.  Dispense: 16 g; Refill: 6 - levocetirizine (XYZAL) 5 MG tablet; Take 1 tablet (5 mg total) by mouth every evening.  Dispense: 30 tablet; Refill: 2  3. Acute pain of left shoulder Uncontrolled. Rx'd meloxicam. Offered PT but patient would like to wait and do exercises at home for now since it is improving. Discussed if a MRI is needed, she will need to fail PT first.  - meloxicam (MOBIC) 7.5 MG tablet; Take 1 tablet (7.5 mg total) by mouth daily.  Dispense: 30 tablet; Refill: 2 - predniSONE (STERAPRED UNI-PAK 21 TAB) 10 MG (21) TBPK tablet; As directed x 6 days  Dispense: 21 tablet; Refill: 0 - DG Shoulder Left  4. Lump of skin - [place] MiscellaneoUS Localization; Future   Follow up plan: Return if symptoms worsen or fail to improve.  Hendricks Limes, MSN, APRN, FNP-C [facility]  Subjective:   Patient ID: Lisa Simpson, female    DOB: 06/26/1975, 45 y.o.   MRN: [id number]  HPI: Lisa Simpson is a 45 y.o. female presenting on 06/12/2020 for Tick Removal (Removed tick on abd x 2 days ago ), Cough, and Wheezing (X 3 weeks)  Patient reports she removed a tick from her abdomen 2 days ago.  The bite area itches.  She describes the tick as brown with a white spot on it.  She denies any nausea, vomiting, increased joint pain, rash, fever, or headaches.  Patient reports coughing and wheezing x3 weeks.  It is worse at night.  Also reports postnasal drainage, sneezing, and runny nose.  Denies ear pain or pressure, headaches, fever, and shortness of breath.  Complains of left shoulder pain x8 weeks.  States it is slowly getting better.  Has been taking  ibuprofen 400 mg once daily and applying heat.  Denies any accident, injury, or overuse.  Also reports a knot on her right side that bothers her.  She would like it removed.   ROS: Negative unless specifically indicated above in HPI.   Relevant past medical history reviewed and updated as indicated.   Allergies and medications reviewed and updated.   Current Outpatient Medications:  .  albuterol (VENTOLIN HFA) 108 (90 Base) MCG/ACT inhaler, Inhale 2 puffs into the lungs every 6 (six) hours as needed for wheezing or shortness of breath., Disp: 18 g, Rfl: 5 .  fluticasone (FLOVENT HFA) 44 MCG/ACT inhaler, Inhale 2 puffs into the lungs 2 (two) times daily., Disp: 1 each, Rfl: 5 .  hydrOXYzine (ATARAX/VISTARIL) 25 MG tablet, Take 1 tablet (25 mg total) by mouth every 8 (eight) hours as needed for itching., Disp: 30 tablet, Rfl: 5 .  omeprazole (PRILOSEC) 40 MG capsule, Take 1 capsule (40 mg total) by mouth 2 (two) times daily., Disp: 60 capsule, Rfl: 5 .  traZODone (DESYREL) 50 MG tablet, Take 1-2 tablets (50-100 mg total) by mouth at bedtime as needed for sleep., Disp: 30 tablet, Rfl: 2 .  fluticasone (FLONASE) 50 MCG/ACT nasal spray, Place 2 sprays into both nostrils daily., Disp: 16 g, Rfl: 6 .  levocetirizine (XYZAL) 5 MG tablet, Take 1 tablet (5 mg total) by mouth every evening., Disp: 30 tablet, Rfl: 2 .  meloxicam (MOBIC) 7.5 MG tablet, Take 1 tablet (7.5 mg total) by mouth daily., Disp: 30 tablet, Rfl: 2 .  predniSONE (STERAPRED UNI-PAK 21 TAB) 10 MG (21) TBPK tablet, As directed x 6 days, Disp: 21 tablet, Rfl: 0  Allergies  Allergen Reactions  . Diphenhydramine Hcl Palpitations and Shortness Of Breath  . Doxycycline Anaphylaxis    Throat swelling   . Morphine Anaphylaxis  . Hydrocodone-Acetaminophen Itching    Patient stated causes itching and whelps  . Morphine And Related     "made me crazy"    Objective:   BP 119/68   Pulse 64   Temp 97.9 F (36.6 C) (Temporal)   Ht  5\' 8"  (1.727 m)   Wt 234 lb 6.4 oz (106.3 kg)   SpO2 95%   BMI 35.64 kg/m    Physical Exam Vitals reviewed.  Constitutional:      General: She is not in acute distress.    Appearance: Normal appearance. She is obese. She is not ill-appearing, toxic-appearing or diaphoretic.  HENT:     Head: Normocephalic and atraumatic.     Right Ear: Tympanic membrane, ear canal and external ear normal. There is no impacted cerumen.     Left Ear: Tympanic membrane, ear canal and external ear normal. There is no impacted cerumen.     Nose: Rhinorrhea present. No congestion. Rhinorrhea is clear.     Right Turbinates: Swollen and pale.     Left Turbinates: Swollen and pale.     Mouth/Throat:     Mouth: Mucous membranes are moist.     Pharynx: Oropharynx is clear. No oropharyngeal exudate or posterior oropharyngeal erythema.  Eyes:     General: No scleral icterus.       Right eye: No discharge.        Left eye: No discharge.     Conjunctiva/sclera: Conjunctivae normal.  Cardiovascular:     Rate and Rhythm: Normal rate and regular rhythm.     Heart sounds: Normal heart sounds. No murmur heard. No friction rub. No gallop.   Pulmonary:     Effort: Pulmonary effort is normal. No respiratory distress.     Breath sounds: Normal breath sounds. No stridor. No wheezing, rhonchi or rales.  Musculoskeletal:        General: Normal range of motion.     Left shoulder: Tenderness (all around shoulder joint) present. No swelling, deformity, effusion, laceration, bony tenderness or crepitus. Normal range of motion. Normal strength. Normal pulse.     Cervical back: Normal range of motion.  Lymphadenopathy:     Cervical: No cervical adenopathy.  Skin:    General: Skin is warm and dry.     Capillary Refill: Capillary refill takes less than 2 seconds.     Comments: Small raised area on right side of abdomen where patient removed a tick.  Small lump under skin on right side/back area that is tender to the touch.   Neurological:     General: No focal deficit present.     Mental Status: She is alert and oriented to person, place, and time. Mental status is at baseline.  Psychiatric:        Mood and Affect: Mood normal.        Behavior: Behavior normal.        Thought Content: Thought content normal.        Judgment: Judgment normal.

## 2020-06-12 NOTE — Patient Instructions (Signed)
Shoulder Exercises Ask your health care provider which exercises are safe for you. Do exercises exactly as told by your health care provider and adjust them as directed. It is normal to feel mild stretching, pulling, tightness, or discomfort as you do these exercises. Stop right away if you feel sudden pain or your pain gets worse. Do not begin these exercises until told by your health care provider. Stretching exercises External rotation and abduction This exercise is sometimes called corner stretch. This exercise rotates your arm outward (external rotation) and moves your arm out from your body (abduction). 1. Stand in a doorway with one of your feet slightly in front of the other. This is called a staggered stance. If you cannot reach your forearms to the door frame, stand facing a corner of a room. 2. Choose one of the following positions as told by your health care provider: ? Place your hands and forearms on the door frame above your head. ? Place your hands and forearms on the door frame at the height of your head. ? Place your hands on the door frame at the height of your elbows. 3. Slowly move your weight onto your front foot until you feel a stretch across your chest and in the front of your shoulders. Keep your head and chest upright and keep your abdominal muscles tight. 4. Hold for __________ seconds. 5. To release the stretch, shift your weight to your back foot. Repeat __________ times. Complete this exercise __________ times a day.   Extension, standing 1. Stand and hold a broomstick, a cane, or a similar object behind your back. ? Your hands should be a little wider than shoulder width apart. ? Your palms should face away from your back. 2. Keeping your elbows straight and your shoulder muscles relaxed, move the stick away from your body until you feel a stretch in your shoulders (extension). ? Avoid shrugging your shoulders while you move the stick. Keep your shoulder blades  tucked down toward the middle of your back. 3. Hold for __________ seconds. 4. Slowly return to the starting position. Repeat __________ times. Complete this exercise __________ times a day. Range-of-motion exercises Pendulum 1. Stand near a wall or a surface that you can hold onto for balance. 2. Bend at the waist and let your left / right arm hang straight down. Use your other arm to support you. Keep your back straight and do not lock your knees. 3. Relax your left / right arm and shoulder muscles, and move your hips and your trunk so your left / right arm swings freely. Your arm should swing because of the motion of your body, not because you are using your arm or shoulder muscles. 4. Keep moving your hips and trunk so your arm swings in the following directions, as told by your health care provider: ? Side to side. ? Forward and backward. ? In clockwise and counterclockwise circles. 5. Continue each motion for __________ seconds, or for as long as told by your health care provider. 6. Slowly return to the starting position. Repeat __________ times. Complete this exercise __________ times a day.   Shoulder flexion, standing 1. Stand and hold a broomstick, a cane, or a similar object. Place your hands a little more than shoulder width apart on the object. Your left / right hand should be palm up, and your other hand should be palm down. 2. Keep your elbow straight and your shoulder muscles relaxed. Push the stick up with your healthy   arm to raise your left / right arm in front of your body, and then over your head until you feel a stretch in your shoulder (flexion). ? Avoid shrugging your shoulder while you raise your arm. Keep your shoulder blade tucked down toward the middle of your back. 3. Hold for __________ seconds. 4. Slowly return to the starting position. Repeat __________ times. Complete this exercise __________ times a day.   Shoulder abduction, standing 1. Stand and hold a  broomstick, a cane, or a similar object. Place your hands a little more than shoulder width apart on the object. Your left / right hand should be palm up, and your other hand should be palm down. 2. Keep your elbow straight and your shoulder muscles relaxed. Push the object across your body toward your left / right side. Raise your left / right arm to the side of your body (abduction) until you feel a stretch in your shoulder. ? Do not raise your arm above shoulder height unless your health care provider tells you to do that. ? If directed, raise your arm over your head. ? Avoid shrugging your shoulder while you raise your arm. Keep your shoulder blade tucked down toward the middle of your back. 3. Hold for __________ seconds. 4. Slowly return to the starting position. Repeat __________ times. Complete this exercise __________ times a day. Internal rotation 1. Place your left / right hand behind your back, palm up. 2. Use your other hand to dangle an exercise band, a towel, or a similar object over your shoulder. Grasp the band with your left / right hand so you are holding on to both ends. 3. Gently pull up on the band until you feel a stretch in the front of your left / right shoulder. The movement of your arm toward the center of your body is called internal rotation. ? Avoid shrugging your shoulder while you raise your arm. Keep your shoulder blade tucked down toward the middle of your back. 4. Hold for __________ seconds. 5. Release the stretch by letting go of the band and lowering your hands. Repeat __________ times. Complete this exercise __________ times a day.   Strengthening exercises External rotation 1. Sit in a stable chair without armrests. 2. Secure an exercise band to a stable object at elbow height on your left / right side. 3. Place a soft object, such as a folded towel or a small pillow, between your left / right upper arm and your body to move your elbow about 4 inches (10  cm) away from your side. 4. Hold the end of the exercise band so it is tight and there is no slack. 5. Keeping your elbow pressed against the soft object, slowly move your forearm out, away from your abdomen (external rotation). Keep your body steady so only your forearm moves. 6. Hold for __________ seconds. 7. Slowly return to the starting position. Repeat __________ times. Complete this exercise __________ times a day.   Shoulder abduction 1. Sit in a stable chair without armrests, or stand up. 2. Hold a __________ weight in your left / right hand, or hold an exercise band with both hands. 3. Start with your arms straight down and your left / right palm facing in, toward your body. 4. Slowly lift your left / right hand out to your side (abduction). Do not lift your hand above shoulder height unless your health care provider tells you that this is safe. ? Keep your arms straight. ? Avoid   shrugging your shoulder while you do this movement. Keep your shoulder blade tucked down toward the middle of your back. 5. Hold for __________ seconds. 6. Slowly lower your arm, and return to the starting position. Repeat __________ times. Complete this exercise __________ times a day.   Shoulder extension 1. Sit in a stable chair without armrests, or stand up. 2. Secure an exercise band to a stable object in front of you so it is at shoulder height. 3. Hold one end of the exercise band in each hand. Your palms should face each other. 4. Straighten your elbows and lift your hands up to shoulder height. 5. Step back, away from the secured end of the exercise band, until the band is tight and there is no slack. 6. Squeeze your shoulder blades together as you pull your hands down to the sides of your thighs (extension). Stop when your hands are straight down by your sides. Do not let your hands go behind your body. 7. Hold for __________ seconds. 8. Slowly return to the starting position. Repeat __________  times. Complete this exercise __________ times a day. Shoulder row 1. Sit in a stable chair without armrests, or stand up. 2. Secure an exercise band to a stable object in front of you so it is at waist height. 3. Hold one end of the exercise band in each hand. Position your palms so that your thumbs are facing the ceiling (neutral position). 4. Bend each of your elbows to a 90-degree angle (right angle) and keep your upper arms at your sides. 5. Step back until the band is tight and there is no slack. 6. Slowly pull your elbows back behind you. 7. Hold for __________ seconds. 8. Slowly return to the starting position. Repeat __________ times. Complete this exercise __________ times a day. Shoulder press-ups 1. Sit in a stable chair that has armrests. Sit upright, with your feet flat on the floor. 2. Put your hands on the armrests so your elbows are bent and your fingers are pointing forward. Your hands should be about even with the sides of your body. 3. Push down on the armrests and use your arms to lift yourself off the chair. Straighten your elbows and lift yourself up as much as you comfortably can. ? Move your shoulder blades down, and avoid letting your shoulders move up toward your ears. ? Keep your feet on the ground. As you get stronger, your feet should support less of your body weight as you lift yourself up. 4. Hold for __________ seconds. 5. Slowly lower yourself back into the chair. Repeat __________ times. Complete this exercise __________ times a day.   Wall push-ups 1. Stand so you are facing a stable wall. Your feet should be about one arm-length away from the wall. 2. Lean forward and place your palms on the wall at shoulder height. 3. Keep your feet flat on the floor as you bend your elbows and lean forward toward the wall. 4. Hold for __________ seconds. 5. Straighten your elbows to push yourself back to the starting position. Repeat __________ times. Complete this  exercise __________ times a day.   This information is not intended to replace advice given to you by your health care provider. Make sure you discuss any questions you have with your health care provider. Document Revised: 05/27/2018 Document Reviewed: 03/04/2018 Elsevier Patient Education  2021 [employer].  

## 2020-06-16 ENCOUNTER — Telehealth: Payer: Self-pay | Admitting: Family Medicine

## 2020-06-16 DIAGNOSIS — R229 Localized swelling, mass and lump, unspecified: Secondary | ICD-10-CM

## 2020-06-16 NOTE — Telephone Encounter (Signed)
Ultrasound order changed to chest.

## 2020-06-21 ENCOUNTER — Ambulatory Visit (HOSPITAL_COMMUNITY): Payer: Medicare HMO

## 2020-06-27 ENCOUNTER — Encounter: Payer: Self-pay | Admitting: Nurse Practitioner

## 2020-06-27 ENCOUNTER — Ambulatory Visit (INDEPENDENT_AMBULATORY_CARE_PROVIDER_SITE_OTHER): Payer: Medicare HMO | Admitting: Nurse Practitioner

## 2020-06-27 VITALS — Temp 101.1°F

## 2020-06-27 DIAGNOSIS — R059 Cough, unspecified: Secondary | ICD-10-CM | POA: Diagnosis not present

## 2020-06-27 DIAGNOSIS — R197 Diarrhea, unspecified: Secondary | ICD-10-CM | POA: Insufficient documentation

## 2020-06-27 LAB — CULTURE, GROUP A STREP

## 2020-06-27 LAB — RAPID STREP SCREEN (MED CTR MEBANE ONLY): Strep Gp A Ag, IA W/Reflex: NEGATIVE

## 2020-06-27 MED ORDER — ONDANSETRON HCL 4 MG PO TABS: 4.0000 mg | ORAL_TABLET | Freq: Three times a day (TID) | ORAL | 0 refills | Status: DC | PRN

## 2020-06-27 MED ORDER — DM-GUAIFENESIN ER 30-600 MG PO TB12: 1.0000 | ORAL_TABLET | Freq: Two times a day (BID) | ORAL | 0 refills | Status: DC

## 2020-06-27 MED ORDER — LOPERAMIDE HCL 2 MG PO TABS: 2.0000 mg | ORAL_TABLET | Freq: Four times a day (QID) | ORAL | 0 refills | Status: DC | PRN

## 2020-06-27 NOTE — Progress Notes (Signed)
   Virtual Visit  Note Due to COVID-19 pandemic this visit was conducted virtually. This visit type was conducted due to national recommendations for restrictions regarding the COVID-19 Pandemic (e.g. social distancing, sheltering in place) in an effort to limit this patient's exposure and mitigate transmission in our community. All issues noted in this document were discussed and addressed.  A physical exam was not performed with this format.  I connected with Lisa Simpson on 06/27/20 at  11:23 AM by telephone and verified that I am speaking with the correct person using two identifiers. Lisa Simpson is currently located at home during visit. The provider, Ivy Lynn, NP is located in their office at time of visit.  I discussed the limitations, risks, security and privacy concerns of performing an evaluation and management service by telephone and the availability of in person appointments. I also discussed with the patient that there may be a patient responsible charge related to this service. The patient expressed understanding and agreed to proceed.   History and Present Illness:  Diarrhea  This is a new (In the past 4 days) problem. The problem occurs 2 to 4 times per day. The problem has been gradually improving. The stool consistency is described as watery. The patient states that diarrhea does not awaken her from sleep. Associated symptoms include arthralgias, chills, a fever and a URI. Pertinent negatives include no abdominal pain. Nothing aggravates the symptoms. There are no known risk factors. She has tried analgesics for the symptoms. The treatment provided no relief.      Review of Systems  Constitutional: Positive for chills and fever.  Gastrointestinal: Positive for diarrhea. Negative for abdominal pain.  Musculoskeletal: Positive for arthralgias.  All other systems reviewed and are negative.    Observations/Objective: Televisit-patient did not sound to be in  distress.  Assessment and Plan:   Follow Up Instructions:  Follow-up with worsening or unresolved symptoms.   I discussed the assessment and treatment plan with the patient. The patient was provided an opportunity to ask questions and all were answered. The patient agreed with the plan and demonstrated an understanding of the instructions.   The patient was advised to call back or seek an in-person evaluation if the symptoms worsen or if the condition fails to improve as anticipated.  The above assessment and management plan was discussed with the patient. The patient verbalized understanding of and has agreed to the management plan. Patient is aware to call the clinic if symptoms persist or worsen. Patient is aware when to return to the clinic for a follow-up visit. Patient educated on when it is appropriate to go to the emergency department.   Time call ended: 11:30 AM  I provided 7 minutes of  non face-to-face time during this encounter.    Ivy Lynn, NP

## 2020-06-27 NOTE — Assessment & Plan Note (Signed)
Uncontrolled cough symptoms not well managed.  Started patient on guaifenesin.  Follow-up with worsening unresolved symptoms Rx sent to pharmacy.

## 2020-06-27 NOTE — Assessment & Plan Note (Signed)
Diarrhea in the last 24 hours.  Weight fevers 103.3 and uncontrolled nausea.  Patient is also reporting cough and congestion sinus pressure and loss of appetite.  Patient was recently treated for sinusitis.  On assessment patient is exhibiting symptoms of strep/COVID.  Advised patient to come to clinic to complete a COVID-19/strep throat swab.  At this time with read nausea with Zofran 4 mg tablet every 8 hours as needed, and Imodium for diarrhea.  Advised patient to take medication as prescribed Increase hydration and electrolyte Brat diet Follow-up with worsening unresolved symptoms  Rx sent to pharmacy.

## 2020-06-28 LAB — NOVEL CORONAVIRUS, NAA: SARS-CoV-2, NAA: DETECTED — AB

## 2020-06-28 LAB — SARS-COV-2, NAA 2 DAY TAT

## 2020-06-29 ENCOUNTER — Telehealth: Payer: Self-pay | Admitting: Physician Assistant

## 2020-06-29 NOTE — Telephone Encounter (Signed)
Called to discuss with patient about Covid symptoms and the use of a monoclonal antibody infusion for those with mild to moderate Covid symptoms and at a high risk of hospitalization.   Pt is qualified for the monoclonal antibody infusion, but due to medication shortages we cannot offer the pt the infusion at this time.    She is feeling better and symptoms resolved. Today is 7th day.   This decision was based on clinical judgement as well as the the NIH Covid 19 treatment guidelines for treatment prioritization and equitable access. Symptoms tier reviewed as well as criteria for ending isolation. Preventative practices reviewed. Patient verbalized understanding.  [place], [place]  06/29/2020 10:21 AM

## 2020-06-30 ENCOUNTER — Other Ambulatory Visit: Payer: Self-pay | Admitting: Nurse Practitioner

## 2020-06-30 DIAGNOSIS — U071 COVID-19: Secondary | ICD-10-CM

## 2020-07-01 ENCOUNTER — Ambulatory Visit (HOSPITAL_COMMUNITY): Payer: Medicare HMO

## 2020-07-01 ENCOUNTER — Telehealth: Payer: Self-pay

## 2020-07-03 ENCOUNTER — Ambulatory Visit (HOSPITAL_COMMUNITY): Admission: RE | Admit: 2020-07-03 | Payer: Medicare HMO | Source: Ambulatory Visit

## 2020-07-05 ENCOUNTER — Encounter: Payer: Self-pay | Admitting: *Deleted

## 2020-07-05 ENCOUNTER — Telehealth: Payer: Self-pay | Admitting: Family Medicine

## 2020-07-05 NOTE — Telephone Encounter (Signed)
Note done and given to Annika to email to pt

## 2020-07-05 NOTE — Telephone Encounter (Signed)
Needs the date on her work note changed to 5/20 instead of 5/19 for her to go back to work

## 2020-07-09 ENCOUNTER — Ambulatory Visit (HOSPITAL_COMMUNITY): Payer: Medicare HMO

## 2020-07-12 ENCOUNTER — Other Ambulatory Visit: Payer: Self-pay | Admitting: Nurse Practitioner

## 2020-08-16 ENCOUNTER — Telehealth: Payer: Self-pay | Admitting: Family Medicine

## 2020-08-16 ENCOUNTER — Other Ambulatory Visit: Payer: Self-pay | Admitting: *Deleted

## 2020-08-16 DIAGNOSIS — J4540 Moderate persistent asthma, uncomplicated: Secondary | ICD-10-CM

## 2020-08-16 MED ORDER — ALBUTEROL SULFATE HFA 108 (90 BASE) MCG/ACT IN AERS: 2.0000 | INHALATION_SPRAY | Freq: Four times a day (QID) | RESPIRATORY_TRACT | 5 refills | Status: DC | PRN

## 2020-08-16 NOTE — Telephone Encounter (Signed)
Pt needs her albuterol Rx sent to [facility] in [place] ASAP.

## 2020-08-16 NOTE — Telephone Encounter (Signed)
done

## 2020-09-02 ENCOUNTER — Ambulatory Visit (HOSPITAL_COMMUNITY): Admission: RE | Admit: 2020-09-02 | Payer: Medicare HMO | Source: Ambulatory Visit

## 2020-09-10 ENCOUNTER — Ambulatory Visit (INDEPENDENT_AMBULATORY_CARE_PROVIDER_SITE_OTHER): Payer: Medicare HMO | Admitting: Family Medicine

## 2020-09-10 ENCOUNTER — Encounter: Payer: Self-pay | Admitting: Family Medicine

## 2020-09-10 ENCOUNTER — Other Ambulatory Visit: Payer: Self-pay

## 2020-09-10 VITALS — BP 124/81 | HR 77 | Temp 98.0°F | Resp 20 | Ht 68.0 in | Wt 228.0 lb

## 2020-09-10 DIAGNOSIS — Z720 Tobacco use: Secondary | ICD-10-CM | POA: Diagnosis not present

## 2020-09-10 DIAGNOSIS — J4540 Moderate persistent asthma, uncomplicated: Secondary | ICD-10-CM | POA: Diagnosis not present

## 2020-09-10 DIAGNOSIS — R229 Localized swelling, mass and lump, unspecified: Secondary | ICD-10-CM | POA: Diagnosis not present

## 2020-09-10 DIAGNOSIS — K219 Gastro-esophageal reflux disease without esophagitis: Secondary | ICD-10-CM | POA: Diagnosis not present

## 2020-09-10 DIAGNOSIS — R059 Cough, unspecified: Secondary | ICD-10-CM

## 2020-09-10 DIAGNOSIS — E782 Mixed hyperlipidemia: Secondary | ICD-10-CM

## 2020-09-10 DIAGNOSIS — R0989 Other specified symptoms and signs involving the circulatory and respiratory systems: Secondary | ICD-10-CM

## 2020-09-10 DIAGNOSIS — J302 Other seasonal allergic rhinitis: Secondary | ICD-10-CM

## 2020-09-10 MED ORDER — FLUTICASONE PROPIONATE HFA 44 MCG/ACT IN AERO: 2.0000 | INHALATION_SPRAY | Freq: Two times a day (BID) | RESPIRATORY_TRACT | 5 refills | Status: DC

## 2020-09-10 MED ORDER — OMEPRAZOLE 40 MG PO CPDR: 40.0000 mg | DELAYED_RELEASE_CAPSULE | Freq: Two times a day (BID) | ORAL | 1 refills | Status: DC

## 2020-09-10 MED ORDER — METHYLPREDNISOLONE 4 MG PO TBPK: ORAL_TABLET | ORAL | 0 refills | Status: DC

## 2020-09-10 MED ORDER — LEVOCETIRIZINE DIHYDROCHLORIDE 5 MG PO TABS: 5.0000 mg | ORAL_TABLET | Freq: Every evening | ORAL | 1 refills | Status: AC

## 2020-09-10 MED ORDER — FLUTICASONE PROPIONATE 50 MCG/ACT NA SUSP
2.0000 | Freq: Every day | NASAL | 6 refills | Status: AC
Start: 2020-09-10 — End: ?

## 2020-09-10 MED ORDER — BENZONATATE 100 MG PO CAPS: 100.0000 mg | ORAL_CAPSULE | Freq: Three times a day (TID) | ORAL | 2 refills | Status: DC | PRN

## 2020-09-10 NOTE — Progress Notes (Signed)
Assessment & Plan:  1. Lump of skin Ultrasound order placed again to include the new spot on the left side.  Discussed with patient I cannot tell her anything further until she goes to have this ultrasound done. - [place] CHEST SOFT TISSUE; Future  2. Cough - benzonatate (TESSALON PERLES) 100 MG capsule; Take 1 capsule (100 mg total) by mouth 3 (three) times daily as needed for cough.  Dispense: 30 capsule; Refill: 2  3. Chest congestion - methylPREDNISolone (MEDROL DOSEPAK) 4 MG TBPK tablet; Use as directed.  Dispense: 21 each; Refill: 0   Return in about 3 months (around 12/11/2020) for follow-up of chronic medication conditions.  Hendricks Limes, MSN, APRN, FNP-C [facility]  Subjective:    Patient ID: Lisa Simpson, female    DOB: [birth year], 45 y.o.   MRN: [id number]  Patient Care Team: Loman Brooklyn, FNP as PCP - General (Family Medicine) Cameron Sprang, MD as Consulting Physician (Neurology)   Chief Complaint:  Chief Complaint  Patient presents with   Knots on sides of abdomen    HPI: Lisa Simpson is a 45 y.o. female presenting on 09/10/2020 for Knots on sides of abdomen  Patient is here due to knots on the sides of her abdomen.  She was seen for this on 06/12/2020 at which time an ultrasound was ordered to further evaluate, which was never completed.  In April she only had a knot on the right side; today she reports she now has one on the left as well.  They are tender to the touch.  New complaints: Patient reports she has had a lingering cough since she had COVID in June.  It is worse at night.  She feels like she has a lot of congestion in her chest that she just cannot get up.   Social history:  Relevant past medical, surgical, family and social history reviewed and updated as indicated. Interim medical history since our last visit reviewed.  Allergies and medications reviewed and updated.  DATA REVIEWED: CHART IN EPIC  ROS:  Negative unless specifically indicated above in HPI.    Current Outpatient Medications:    albuterol (VENTOLIN HFA) 108 (90 Base) MCG/ACT inhaler, Inhale 2 puffs into the lungs every 6 (six) hours as needed for wheezing or shortness of breath., Disp: 18 g, Rfl: 5   fluticasone (FLONASE) 50 MCG/ACT nasal spray, Place 2 sprays into both nostrils daily., Disp: 16 g, Rfl: 6   fluticasone (FLOVENT HFA) 44 MCG/ACT inhaler, Inhale 2 puffs into the lungs 2 (two) times daily., Disp: 1 each, Rfl: 5   hydrOXYzine (ATARAX/VISTARIL) 25 MG tablet, Take 1 tablet (25 mg total) by mouth every 8 (eight) hours as needed for itching., Disp: 30 tablet, Rfl: 5   levocetirizine (XYZAL) 5 MG tablet, Take 1 tablet (5 mg total) by mouth every evening., Disp: 30 tablet, Rfl: 2   meloxicam (MOBIC) 7.5 MG tablet, Take 1 tablet (7.5 mg total) by mouth daily., Disp: 30 tablet, Rfl: 2   omeprazole (PRILOSEC) 40 MG capsule, Take 1 capsule (40 mg total) by mouth 2 (two) times daily., Disp: 60 capsule, Rfl: 5   ondansetron (ZOFRAN) 4 MG tablet, Take 1 tablet (4 mg total) by mouth every 8 (eight) hours as needed for nausea or vomiting., Disp: 20 tablet, Rfl: 0   traZODone (DESYREL) 50 MG tablet, Take 1-2 tablets (50-100 mg total) by mouth at bedtime as needed for sleep. (Patient not taking: Reported on 09/10/2020), Disp:  30 tablet, Rfl: 2   Allergies  Allergen Reactions   Diphenhydramine Hcl Palpitations and Shortness Of Breath   Doxycycline Anaphylaxis    Throat swelling    Morphine Anaphylaxis   Hydrocodone-Acetaminophen Itching    Patient stated causes itching and whelps   Morphine And Related     "made me crazy"   Past Medical History:  Diagnosis Date   Asthma    Borderline hypercholesterolemia    Family history of anesthesia complication    "dad woke up during surgery"   Gall stones 2014   GERD (gastroesophageal reflux disease)    Headache(784.0)    occasional   Heart murmur    Heart palpitations    "feels  fluttery"   History of kidney stones    Mastoiditis of left side 2015   Pneumonia oct 2014   hx of   Seizures ([place]) 2013   On meds since childhood, unknown etiology, No seizures for about 1 year.    Past Surgical History:  Procedure Laterality Date   CHOLECYSTECTOMY N/A 06/28/2013   Procedure: LAPAROSCOPIC CHOLECYSTECTOMY WITH INTRAOPERATIVE CHOLANGIOGRAM;  Surgeon: Adin Hector, MD;  Location: [facility];  Service: General;  Laterality: N/A;   MULTIPLE TOOTH EXTRACTIONS     MYOMECTOMY N/A 12/14/2018   Procedure: VAGINAL REMOVAL OF PROLAPSED SUBMUCOSAL MYOMA;  Surgeon: Florian Buff, MD;  Location: [facility];  Service: Gynecology;  Laterality: N/A;   TONSILLECTOMY     as child    Social History   Socioeconomic History   Marital status: Legally Separated    Spouse name: Not on file   Number of children: 3   Years of education: Not on file   Highest education level: 11th grade  Occupational History   Not on file  Tobacco Use   Smoking status: Every Day    Packs/day: 0.50    Years: 12.00    Pack years: 6.00    Types: Cigarettes   Smokeless tobacco: Never  Vaping Use   Vaping Use: Never used  Substance and Sexual Activity   Alcohol use: No   Drug use: No   Sexual activity: Not Currently    Birth control/protection: None  Other Topics Concern   Not on file  Social History Narrative   Right handed    Social Determinants of Health   Financial Resource Strain: Not on file  Food Insecurity: Not on file  Transportation Needs: Not on file  Physical Activity: Not on file  Stress: Not on file  Social Connections: Not on file  Intimate Partner Violence: Not on file        Objective:    BP 124/81   Pulse 77   Temp 98 F (36.7 C) (Temporal)   Resp 20   Ht '5\' 8"'$  (1.727 m)   Wt 228 lb (103.4 kg)   SpO2 98%   BMI 34.67 kg/m   Wt Readings from Last 3 Encounters:  09/10/20 228 lb (103.4 kg)  06/12/20 234 lb 6.4 oz (106.3 kg)  05/28/19 250 lb (113.4 kg)     Physical Exam Vitals reviewed.  Constitutional:      General: She is not in acute distress.    Appearance: Normal appearance. She is obese. She is not ill-appearing, toxic-appearing or diaphoretic.  HENT:     Head: Normocephalic and atraumatic.  Eyes:     General: No scleral icterus.       Right eye: No discharge.        Left eye: No  discharge.     Conjunctiva/sclera: Conjunctivae normal.  Cardiovascular:     Rate and Rhythm: Normal rate and regular rhythm.     Heart sounds: Normal heart sounds. No murmur heard.   No friction rub. No gallop.  Pulmonary:     Effort: Pulmonary effort is normal. No respiratory distress.     Breath sounds: Normal breath sounds. No stridor. No wheezing, rhonchi or rales.  Musculoskeletal:        General: Normal range of motion.     Cervical back: Normal range of motion.  Skin:    General: Skin is warm and dry.     Capillary Refill: Capillary refill takes less than 2 seconds.     Comments: Small lump under skin on right side/back area that is tender to the touch. Another small lump on the left side/back area.  Neurological:     General: No focal deficit present.     Mental Status: She is alert and oriented to person, place, and time. Mental status is at baseline.  Psychiatric:        Mood and Affect: Mood normal.        Behavior: Behavior normal.        Thought Content: Thought content normal.        Judgment: Judgment normal.    Lab Results  Component Value Date   TSH 0.998 11/21/2018   Lab Results  Component Value Date   WBC 16.6 (H) 12/15/2018   HGB 13.2 12/15/2018   HCT 42.1 12/15/2018   MCV 86.1 12/15/2018   PLT 366 12/15/2018   Lab Results  Component Value Date   NA 136 12/15/2018   K 3.8 12/15/2018   CO2 23 12/15/2018   GLUCOSE 98 12/15/2018   BUN 11 12/15/2018   CREATININE 0.72 12/15/2018   BILITOT 0.4 12/15/2018   ALKPHOS 78 12/15/2018   AST 15 12/15/2018   ALT 17 12/15/2018   PROT 7.5 12/15/2018   ALBUMIN 3.8  12/15/2018   CALCIUM 8.8 (L) 12/15/2018   ANIONGAP 10 12/15/2018   Lab Results  Component Value Date   CHOL 197 06/04/2008   Lab Results  Component Value Date   HDL 41 06/04/2008   Lab Results  Component Value Date   LDLCALC 114 (H) 06/04/2008   Lab Results  Component Value Date   TRIG 208 (H) 06/04/2008   Lab Results  Component Value Date   CHOLHDL 4.8 Ratio 06/04/2008   No results found for: HGBA1C

## 2020-09-11 ENCOUNTER — Encounter: Payer: Self-pay | Admitting: Family Medicine

## 2020-09-11 DIAGNOSIS — E782 Mixed hyperlipidemia: Secondary | ICD-10-CM

## 2020-09-11 HISTORY — DX: Mixed hyperlipidemia: E78.2

## 2020-09-11 LAB — CBC WITH DIFFERENTIAL/PLATELET
Basophils Absolute: 0 10*3/uL (ref 0.0–0.2)
Basos: 0 %
EOS (ABSOLUTE): 0.2 10*3/uL (ref 0.0–0.4)
Eos: 2 %
Hematocrit: 42.7 % (ref 34.0–46.6)
Hemoglobin: 14.1 g/dL (ref 11.1–15.9)
Immature Grans (Abs): 0.1 10*3/uL (ref 0.0–0.1)
Immature Granulocytes: 1 %
Lymphocytes Absolute: 4.4 10*3/uL — ABNORMAL HIGH (ref 0.7–3.1)
Lymphs: 31 %
MCH: 27 pg (ref 26.6–33.0)
MCHC: 33 g/dL (ref 31.5–35.7)
MCV: 82 fL (ref 79–97)
Monocytes Absolute: 0.6 10*3/uL (ref 0.1–0.9)
Monocytes: 5 %
Neutrophils Absolute: 8.6 10*3/uL — ABNORMAL HIGH (ref 1.4–7.0)
Neutrophils: 61 %
Platelets: 362 10*3/uL (ref 150–450)
RBC: 5.22 x10E6/uL (ref 3.77–5.28)
RDW: 14.5 % (ref 11.7–15.4)
WBC: 13.9 10*3/uL — ABNORMAL HIGH (ref 3.4–10.8)

## 2020-09-11 LAB — CMP14+EGFR
ALT: 15 IU/L (ref 0–32)
AST: 15 IU/L (ref 0–40)
Albumin/Globulin Ratio: 1.4 (ref 1.2–2.2)
Albumin: 4.2 g/dL (ref 3.8–4.8)
Alkaline Phosphatase: 110 IU/L (ref 44–121)
BUN/Creatinine Ratio: 15 (ref 9–23)
BUN: 10 mg/dL (ref 6–24)
Bilirubin Total: <0.2 mg/dL (ref 0.0–1.2)
CO2: 23 mmol/L (ref 20–29)
Calcium: 9.5 mg/dL (ref 8.7–10.2)
Chloride: 99 mmol/L (ref 96–106)
Creatinine, Ser: 0.65 mg/dL (ref 0.57–1.00)
Globulin, Total: 2.9 g/dL (ref 1.5–4.5)
Glucose: 92 mg/dL (ref 65–99)
Potassium: 4.3 mmol/L (ref 3.5–5.2)
Sodium: 139 mmol/L (ref 134–144)
Total Protein: 7.1 g/dL (ref 6.0–8.5)
eGFR: 112 mL/min/{1.73_m2} (ref >59)

## 2020-09-11 LAB — LIPID PANEL
Chol/HDL Ratio: 7.2 ratio — ABNORMAL HIGH (ref 0.0–4.4)
Cholesterol, Total: 245 mg/dL — ABNORMAL HIGH (ref 100–199)
HDL: 34 mg/dL — ABNORMAL LOW (ref >39)
LDL Chol Calc (NIH): 149 mg/dL — ABNORMAL HIGH (ref 0–99)
Triglycerides: 331 mg/dL — ABNORMAL HIGH (ref 0–149)
VLDL Cholesterol Cal: 62 mg/dL — ABNORMAL HIGH (ref 5–40)

## 2020-09-12 ENCOUNTER — Telehealth: Payer: Self-pay | Admitting: Family Medicine

## 2020-09-12 NOTE — Telephone Encounter (Signed)
Spoke with patient and discussed lab results with her

## 2020-09-16 ENCOUNTER — Telehealth: Payer: Self-pay | Admitting: Family Medicine

## 2020-09-18 ENCOUNTER — Ambulatory Visit (HOSPITAL_COMMUNITY): Payer: Medicare HMO

## 2020-09-24 ENCOUNTER — Ambulatory Visit (HOSPITAL_COMMUNITY): Admission: RE | Admit: 2020-09-24 | Payer: Medicare HMO | Source: Ambulatory Visit

## 2021-01-06 ENCOUNTER — Ambulatory Visit (INDEPENDENT_AMBULATORY_CARE_PROVIDER_SITE_OTHER): Payer: Medicare HMO

## 2021-01-06 VITALS — Ht 68.0 in | Wt 228.0 lb

## 2021-01-06 DIAGNOSIS — Z0000 Encounter for general adult medical examination without abnormal findings: Secondary | ICD-10-CM | POA: Diagnosis not present

## 2021-01-06 DIAGNOSIS — Z1231 Encounter for screening mammogram for malignant neoplasm of breast: Secondary | ICD-10-CM | POA: Diagnosis not present

## 2021-01-06 NOTE — Patient Instructions (Addendum)
Lisa Simpson , Thank you for taking time to come for your Medicare Wellness Visit. I appreciate your ongoing commitment to your health goals. Please review the following plan we discussed and let me know if I can assist you in the future.   Screening recommendations/referrals: Colonoscopy: Not due until age 45 Mammogram: Done 02/16/2014. Order placed today to schedule for after the first of the year.  Bone Density: Not due until age 64.  Recommended yearly ophthalmology/optometry visit for glaucoma screening and checkup Recommended yearly dental visit for hygiene and checkup  Vaccinations: Influenza vaccine: Declined. Pneumococcal vaccine: Done 12/02/2015 and 04/01/2018. Tdap vaccine: Done 03/10/2017 Repeat in 10 years  Shingles vaccine: Shingrix discussed. Please contact your pharmacy for coverage information.  Not due until age 74.  Covid-19: Declined.  Advanced directives: Advance directive discussed with you today. Even though you declined this today, please call our office should you change your mind, and we can give you the proper paperwork for you to fill out.   Conditions/risks identified: Aim for 30 minutes of exercise or brisk walking each day, drink 6-8 glasses of water and eat lots of fruits and vegetables. KEEP UP THE GOOD WORK!!  Next appointment: Follow up in one year for your annual wellness visit. 12/2021.  Preventive Care 40-64 Years, Female Preventive care refers to lifestyle choices and visits with your health care provider that can promote health and wellness. What does preventive care include? A yearly physical exam. This is also called an annual well check. Dental exams once or twice a year. Routine eye exams. Ask your health care provider how often you should have your eyes checked. Personal lifestyle choices, including: Daily care of your teeth and gums. Regular physical activity. Eating a healthy diet. Avoiding tobacco and drug use. Limiting alcohol  use. Practicing safe sex. Taking low-dose aspirin daily starting at age 38. Taking vitamin and mineral supplements as recommended by your health care provider. What happens during an annual well check? The services and screenings done by your health care provider during your annual well check will depend on your age, overall health, lifestyle risk factors, and family history of disease. Counseling  Your health care provider may ask you questions about your: Alcohol use. Tobacco use. Drug use. Emotional well-being. Home and relationship well-being. Sexual activity. Eating habits. Work and work Statistician. Method of birth control. Menstrual cycle. Pregnancy history. Screening  You may have the following tests or measurements: Height, weight, and BMI. Blood pressure. Lipid and cholesterol levels. These may be checked every 5 years, or more frequently if you are over 37 years old. Skin check. Lung cancer screening. You may have this screening every year starting at age 15 if you have a 30-pack-year history of smoking and currently smoke or have quit within the past 15 years. Fecal occult blood test (FOBT) of the stool. You may have this test every year starting at age 33. Flexible sigmoidoscopy or colonoscopy. You may have a sigmoidoscopy every 5 years or a colonoscopy every 10 years starting at age 74. Hepatitis C blood test. Hepatitis B blood test. Sexually transmitted disease (STD) testing. Diabetes screening. This is done by checking your blood sugar (glucose) after you have not eaten for a while (fasting). You may have this done every 1-3 years. Mammogram. This may be done every 1-2 years. Talk to your health care provider about when you should start having regular mammograms. This may depend on whether you have a family history of breast cancer. BRCA-related cancer  screening. This may be done if you have a family history of breast, ovarian, tubal, or peritoneal cancers. Pelvic  exam and Pap test. This may be done every 3 years starting at age 37. Starting at age 17, this may be done every 5 years if you have a Pap test in combination with an HPV test. Bone density scan. This is done to screen for osteoporosis. You may have this scan if you are at high risk for osteoporosis. Discuss your test results, treatment options, and if necessary, the need for more tests with your health care provider. Vaccines  Your health care provider may recommend certain vaccines, such as: Influenza vaccine. This is recommended every year. Tetanus, diphtheria, and acellular pertussis (Tdap, Td) vaccine. You may need a Td booster every 10 years. Zoster vaccine. You may need this after age 57. Pneumococcal 13-valent conjugate (PCV13) vaccine. You may need this if you have certain conditions and were not previously vaccinated. Pneumococcal polysaccharide (PPSV23) vaccine. You may need one or two doses if you smoke cigarettes or if you have certain conditions. Talk to your health care provider about which screenings and vaccines you need and how often you need them. This information is not intended to replace advice given to you by your health care provider. Make sure you discuss any questions you have with your health care provider. Document Released: 03/01/2015 Document Revised: 10/23/2015 Document Reviewed: 12/04/2014 Elsevier Interactive Patient Education  2017 [place] Prevention in the Home Falls can cause injuries. They can happen to people of all ages. There are many things you can do to make your home safe and to help prevent falls. What can I do on the outside of my home? Regularly fix the edges of walkways and driveways and fix any cracks. Remove anything that might make you trip as you walk through a door, such as a raised step or threshold. Trim any bushes or trees on the path to your home. Use bright outdoor lighting. Clear any walking paths of anything that might  make someone trip, such as rocks or tools. Regularly check to see if handrails are loose or broken. Make sure that both sides of any steps have handrails. Any raised decks and porches should have guardrails on the edges. Have any leaves, snow, or ice cleared regularly. Use sand or salt on walking paths during winter. Clean up any spills in your garage right away. This includes oil or grease spills. What can I do in the bathroom? Use night lights. Install grab bars by the toilet and in the tub and shower. Do not use towel bars as grab bars. Use non-skid mats or decals in the tub or shower. If you need to sit down in the shower, use a plastic, non-slip stool. Keep the floor dry. Clean up any water that spills on the floor as soon as it happens. Remove soap buildup in the tub or shower regularly. Attach bath mats securely with double-sided non-slip rug tape. Do not have throw rugs and other things on the floor that can make you trip. What can I do in the bedroom? Use night lights. Make sure that you have a light by your bed that is easy to reach. Do not use any sheets or blankets that are too big for your bed. They should not hang down onto the floor. Have a firm chair that has side arms. You can use this for support while you get dressed. Do not have throw rugs  and other things on the floor that can make you trip. What can I do in the kitchen? Clean up any spills right away. Avoid walking on wet floors. Keep items that you use a lot in easy-to-reach places. If you need to reach something above you, use a strong step stool that has a grab bar. Keep electrical cords out of the way. Do not use floor polish or wax that makes floors slippery. If you must use wax, use non-skid floor wax. Do not have throw rugs and other things on the floor that can make you trip. What can I do with my stairs? Do not leave any items on the stairs. Make sure that there are handrails on both sides of the stairs  and use them. Fix handrails that are broken or loose. Make sure that handrails are as long as the stairways. Check any carpeting to make sure that it is firmly attached to the stairs. Fix any carpet that is loose or worn. Avoid having throw rugs at the top or bottom of the stairs. If you do have throw rugs, attach them to the floor with carpet tape. Make sure that you have a light switch at the top of the stairs and the bottom of the stairs. If you do not have them, ask someone to add them for you. What else can I do to help prevent falls? Wear shoes that: Do not have high heels. Have rubber bottoms. Are comfortable and fit you well. Are closed at the toe. Do not wear sandals. If you use a stepladder: Make sure that it is fully opened. Do not climb a closed stepladder. Make sure that both sides of the stepladder are locked into place. Ask someone to hold it for you, if possible. Clearly mark and make sure that you can see: Any grab bars or handrails. First and last steps. Where the edge of each step is. Use tools that help you move around (mobility aids) if they are needed. These include: Canes. Walkers. Scooters. Crutches. Turn on the lights when you go into a dark area. Replace any light bulbs as soon as they burn out. Set up your furniture so you have a clear path. Avoid moving your furniture around. If any of your floors are uneven, fix them. If there are any pets around you, be aware of where they are. Review your medicines with your doctor. Some medicines can make you feel dizzy. This can increase your chance of falling. Ask your doctor what other things that you can do to help prevent falls. This information is not intended to replace advice given to you by your health care provider. Make sure you discuss any questions you have with your health care provider. Document Released: 11/29/2008 Document Revised: 07/11/2015 Document Reviewed: 03/09/2014 Elsevier Interactive Patient  Education  2017 [employer].

## 2021-01-06 NOTE — Progress Notes (Signed)
Subjective:   Lisa Simpson is a 45 y.o. female who presents for Medicare Annual (Subsequent) preventive examination. Virtual Visit via Telephone Note  I connected with  Lisa Simpson on 01/06/21 at 11:15 AM EST by telephone and verified that I am speaking with the correct person using two identifiers.  Location: Patient: Home Provider: [facility] Persons participating in the virtual visit: patient/Nurse Health Advisor   I discussed the limitations, risks, security and privacy concerns of performing an evaluation and management service by telephone and the availability of in person appointments. The patient expressed understanding and agreed to proceed.  Interactive audio and video telecommunications were attempted between this nurse and patient, however failed, due to patient having technical difficulties OR patient did not have access to video capability.  We continued and completed visit with audio only.  Some vital signs may be absent or patient reported.   Chriss Driver, LPN  Review of Systems     Cardiac Risk Factors include: dyslipidemia;sedentary lifestyle;obesity (BMI >30kg/m2)     Objective:    Today's Vitals   01/06/21 1152  Weight: 228 lb (103.4 kg)  Height: 5\' 8"  (1.727 m)   Body mass index is 34.67 kg/m.  Advanced Directives 01/06/2021 05/28/2019 12/15/2018 12/14/2018 12/12/2018 07/01/2018 10/11/2017  Does Patient Have a Medical Advance Directive? No No No No No No No  Would patient like information on creating a medical advance directive? No - Patient declined - No - Patient declined No - Patient declined No - Patient declined - No - Patient declined  Pre-existing out of facility DNR order (yellow form or pink MOST form) - - - - - - -    Current Medications (verified) Outpatient Encounter Medications as of 01/06/2021  Medication Sig   albuterol (VENTOLIN HFA) 108 (90 Base) MCG/ACT inhaler Inhale 2 puffs into the lungs every 6 (six) hours as needed for  wheezing or shortness of breath.   fluticasone (FLONASE) 50 MCG/ACT nasal spray Place 2 sprays into both nostrils daily.   fluticasone (FLOVENT HFA) 44 MCG/ACT inhaler Inhale 2 puffs into the lungs 2 (two) times daily.   hydrOXYzine (ATARAX/VISTARIL) 25 MG tablet Take 1 tablet (25 mg total) by mouth every 8 (eight) hours as needed for itching.   levocetirizine (XYZAL) 5 MG tablet Take 1 tablet (5 mg total) by mouth every evening.   meloxicam (MOBIC) 7.5 MG tablet Take 1 tablet (7.5 mg total) by mouth daily.   methylPREDNISolone (MEDROL DOSEPAK) 4 MG TBPK tablet Use as directed.   omeprazole (PRILOSEC) 40 MG capsule Take 1 capsule (40 mg total) by mouth 2 (two) times daily.   ondansetron (ZOFRAN) 4 MG tablet Take 1 tablet (4 mg total) by mouth every 8 (eight) hours as needed for nausea or vomiting.   traZODone (DESYREL) 50 MG tablet Take 1-2 tablets (50-100 mg total) by mouth at bedtime as needed for sleep.   benzonatate (TESSALON PERLES) 100 MG capsule Take 1 capsule (100 mg total) by mouth 3 (three) times daily as needed for cough. (Patient not taking: Reported on 01/06/2021)   No facility-administered encounter medications on file as of 01/06/2021.    Allergies (verified) Diphenhydramine hcl, Doxycycline, Morphine, Hydrocodone-acetaminophen, and Morphine and related   History: Past Medical History:  Diagnosis Date   Asthma    Borderline hypercholesterolemia    Family history of anesthesia complication    "dad woke up during surgery"   Gall stones 2014   GERD (gastroesophageal reflux disease)  Headache(784.0)    occasional   Heart murmur    Heart palpitations    "feels fluttery"   History of kidney stones    Mastoiditis of left side 2015   Mixed hyperlipidemia 09/11/2020   Pneumonia 11/2012   hx of   Seizures ([place]) 2013   On meds since childhood, unknown etiology, No seizures for about 1 year.   Past Surgical History:  Procedure Laterality Date   CHOLECYSTECTOMY N/A  06/28/2013   Procedure: LAPAROSCOPIC CHOLECYSTECTOMY WITH INTRAOPERATIVE CHOLANGIOGRAM;  Surgeon: Adin Hector, MD;  Location: [facility];  Service: General;  Laterality: N/A;   MULTIPLE TOOTH EXTRACTIONS     MYOMECTOMY N/A 12/14/2018   Procedure: VAGINAL REMOVAL OF PROLAPSED SUBMUCOSAL MYOMA;  Surgeon: Florian Buff, MD;  Location: [facility];  Service: Gynecology;  Laterality: N/A;   TONSILLECTOMY     as child   Family History  Problem Relation Age of Onset   Hypertension Mother    Hypercholesterolemia Mother    Heart failure Father    Hypertension Sister    Hypercholesterolemia Sister    COPD Sister    Cancer Brother        Unknown   63 / [place] Daughter    Miscarriages / [place] Daughter    Seizures Maternal Grandmother    Anuerysm Maternal Grandmother        brain   Heart failure Maternal Grandfather    Breast cancer Paternal Grandmother    Diabetes Paternal Grandfather    Social History   Socioeconomic History   Marital status: Legally Separated    Spouse name: Not on file   Number of children: 3   Years of education: Not on file   Highest education level: 11th grade  Occupational History   Not on file  Tobacco Use   Smoking status: Every Day    Packs/day: 0.50    Years: 12.00    Pack years: 6.00    Types: Cigarettes   Smokeless tobacco: Never  Vaping Use   Vaping Use: Never used  Substance and Sexual Activity   Alcohol use: No   Drug use: No   Sexual activity: Not Currently    Birth control/protection: None  Other Topics Concern   Not on file  Social History Narrative   Right handed    3 children 15, 17 and 21.   Social Determinants of Health   Financial Resource Strain: Low Risk    Difficulty of Paying Living Expenses: Not hard at all  Food Insecurity: No Food Insecurity   Worried About Charity fundraiser in the Last Year: Never true   [place] in the Last Year: Never true  Transportation Needs: No Transportation Needs    Lack of Transportation (Medical): No   Lack of Transportation (Non-Medical): No  Physical Activity: Insufficiently Active   Days of Exercise per Week: 4 days   Minutes of Exercise per Session: 30 min  Stress: No Stress Concern Present   Feeling of Stress : Not at all  Social Connections: Socially Integrated   Frequency of Communication with Friends and Family: More than three times a week   Frequency of Social Gatherings with Friends and Family: More than three times a week   Attends Religious Services: More than 4 times per year   Active Member of [employer] or Organizations: Yes   Attends Music therapist: More than 4 times per year   Marital Status: Married    Tobacco Counseling Ready  to quit: Not Answered Counseling given: Not Answered   Clinical Intake:  Pre-visit preparation completed: Yes  Pain : No/denies pain     BMI - recorded: 34.67 Nutritional Status: BMI > 30  Obese Nutritional Risks: None Diabetes: No  How often do you need to have someone help you when you read instructions, pamphlets, or other written materials from your doctor or pharmacy?: 1 - Never  Diabetic?No  Interpreter Needed?: No  Information entered by :: MJ Valentine Kuechle, LPN   Activities of Daily Living In your present state of health, do you have any difficulty performing the following activities: 01/06/2021  Hearing? N  Vision? N  Difficulty concentrating or making decisions? N  Walking or climbing stairs? N  Dressing or bathing? N  Doing errands, shopping? N  Preparing Food and eating ? N  Using the Toilet? N  In the past six months, have you accidently leaked urine? N  Do you have problems with loss of bowel control? N  Managing your Medications? N  Managing your Finances? N  Housekeeping or managing your Housekeeping? N  Some recent data might be hidden    Patient Care Team: Loman Brooklyn, FNP as PCP - General (Family Medicine) Cameron Sprang, MD as Consulting  Physician (Neurology) Florian Buff, MD as Consulting Physician (Obstetrics and Gynecology)  Indicate any recent Medical Services you may have received from other than [facility] providers in the past year (date may be approximate).     Assessment:   This is a routine wellness examination for Any.  Hearing/Vision screen Hearing Screening - Comments:: No hearing issues.  Vision Screening - Comments:: No glasses. [facility] [place]. 2021  Dietary issues and exercise activities discussed: Current Exercise Habits: Structured exercise class, Type of exercise: walking;strength training/weights (Pt states she does go to the gym.), Time (Minutes): 30, Frequency (Times/Week): 5, Weekly Exercise (Minutes/Week): 150, Intensity: Mild, Exercise limited by: cardiac condition(s)   Goals Addressed             This Visit's Progress    DIET - REDUCE CALORIE INTAKE       Pt would like to lose weight.        Depression Screen PHQ 2/9 Scores 01/06/2021 09/10/2020 03/14/2020  PHQ - 2 Score 0 0 0  PHQ- 9 Score - 0 5    Fall Risk Fall Risk  01/06/2021 12/23/2018 11/17/2018 07/01/2018  Falls in the past year? 0 0 0 0  Number falls in past yr: 0 - - 0  Injury with Fall? 0 - - 0  Risk for fall due to : No Fall Risks - - -  Follow up Falls prevention discussed - - -    FALL RISK PREVENTION PERTAINING TO THE HOME:  Any stairs in or around the home? No  If so, are there any without handrails? No  Home free of loose throw rugs in walkways, pet beds, electrical cords, etc? Yes  Adequate lighting in your home to reduce risk of falls? Yes   ASSISTIVE DEVICES UTILIZED TO PREVENT FALLS:  Life alert? No  Use of a cane, walker or w/c? No  Grab bars in the bathroom? No  Shower chair or bench in shower? No  Elevated toilet seat or a handicapped toilet? Yes   TIMED UP AND GO:  Was the test performed? No .  Phone visit.  Cognitive Function:     6CIT Screen 01/06/2021  What Year? 0 points  What  month? 0  points  What time? 0 points  Count back from 20 0 points  Months in reverse 2 points  Repeat phrase 0 points  Total Score 2    Immunizations Immunization History  Administered Date(s) Administered   Influenza Split 04/30/2015   Influenza Whole 12/04/2008   Influenza,inj,Quad PF,6+ Mos 01/18/2018   Pneumococcal Conjugate-13 12/02/2015   Pneumococcal Polysaccharide-23 04/01/2018   Tdap 03/10/2017    TDAP status: Up to date  Flu Vaccine status: Declined, Education has been provided regarding the importance of this vaccine but patient still declined. Advised may receive this vaccine at local pharmacy or Health Dept. Aware to provide a copy of the vaccination record if obtained from local pharmacy or Health Dept. Verbalized acceptance and understanding.  Pneumococcal vaccine status: Up to date  Covid-19 vaccine status: Declined, Education has been provided regarding the importance of this vaccine but patient still declined. Advised may receive this vaccine at local pharmacy or Health Dept.or vaccine clinic. Aware to provide a copy of the vaccination record if obtained from local pharmacy or Health Dept. Verbalized acceptance and understanding.  Qualifies for Shingles Vaccine? No   Zostavax completed No   Shingrix Completed?: No.    Education has been provided regarding the importance of this vaccine. Patient has been advised to call insurance company to determine out of pocket expense if they have not yet received this vaccine. Advised may also receive vaccine at local pharmacy or Health Dept. Verbalized acceptance and understanding.  Screening Tests Health Maintenance  Topic Date Due   MAMMOGRAM  02/19/2012   PAP SMEAR-Modifier  02/25/2018   INFLUENZA VACCINE  09/16/2020   Hepatitis C Screening  03/14/2021 (Originally [birth year]1995)   Pneumococcal Vaccine 17-49 Years old (3 - PPSV23 if available, else PCV20) 04/02/2023   TETANUS/TDAP  03/11/2027   HIV Screening  Completed    HPV VACCINES  Aged Out   COVID-19 Vaccine  Discontinued    Health Maintenance  Health Maintenance Due  Topic Date Due   MAMMOGRAM  02/19/2012   PAP SMEAR-Modifier  02/25/2018   INFLUENZA VACCINE  09/16/2020    Colorectal cancer screening: No longer required.  Not required until age 44  Mammogram status: Ordered 01/06/21. Pt provided with contact info and advised to call to schedule appt.   Bone Density status: Ordered Not due until age 20.Marland Kitchen Pt provided with contact info and advised to call to schedule appt.  Lung Cancer Screening: (Low Dose CT Chest recommended if Age 71-80 years, 30 pack-year currently smoking OR have quit w/in 15years.) does not qualify.  Non smoker.  Additional Screening:  Hepatitis C Screening: does not qualify.  Vision Screening: Recommended annual ophthalmology exams for early detection of glaucoma and other disorders of the eye. Is the patient up to date with their annual eye exam?  No  Who is the provider or what is the name of the office in which the patient attends annual eye exams? [facility] If pt is not established with a provider, would they like to be referred to a provider to establish care? No .   Dental Screening: Recommended annual dental exams for proper oral hygiene  Community Resource Referral / Chronic Care Management: CRR required this visit?  No   CCM required this visit?  No      Plan:     I have personally reviewed and noted the following in the patient's chart:   Medical and social history Use of alcohol, tobacco or illicit drugs  Current  medications and supplements including opioid prescriptions.  Functional ability and status Nutritional status Physical activity Advanced directives List of other physicians Hospitalizations, surgeries, and ER visits in previous 12 months Vitals Screenings to include cognitive, depression, and falls Referrals and appointments  In addition, I have reviewed and discussed with  patient certain preventive protocols, quality metrics, and best practice recommendations. A written personalized care plan for preventive services as well as general preventive health recommendations were provided to patient.     Chriss Driver, LPN   [id number]   Nurse Notes: Phone visit. Pt states she is doing well. She currently is in the process of moving. Up to date on all age appropriate health maintenance. Order placed for mammogram to be scheduled after the first of the year. Discussed missing vaccines and pt declines.

## 2021-01-06 NOTE — Addendum Note (Signed)
Addended by: Randal Buba K on: 01/06/2021 02:38 PM   Modules accepted: Orders

## 2021-01-27 ENCOUNTER — Ambulatory Visit: Payer: Medicare HMO

## 2021-02-17 ENCOUNTER — Encounter: Payer: Self-pay | Admitting: Family Medicine

## 2021-02-20 ENCOUNTER — Other Ambulatory Visit: Payer: Self-pay | Admitting: Family Medicine

## 2021-02-20 DIAGNOSIS — J4540 Moderate persistent asthma, uncomplicated: Secondary | ICD-10-CM

## 2021-03-05 ENCOUNTER — Other Ambulatory Visit: Payer: Self-pay | Admitting: Family Medicine

## 2021-03-05 DIAGNOSIS — J4540 Moderate persistent asthma, uncomplicated: Secondary | ICD-10-CM

## 2021-03-06 ENCOUNTER — Telehealth: Payer: Self-pay | Admitting: Family Medicine

## 2021-03-06 DIAGNOSIS — J4540 Moderate persistent asthma, uncomplicated: Secondary | ICD-10-CM

## 2021-03-06 DIAGNOSIS — L509 Urticaria, unspecified: Secondary | ICD-10-CM

## 2021-03-06 MED ORDER — ALBUTEROL SULFATE HFA 108 (90 BASE) MCG/ACT IN AERS: 2.0000 | INHALATION_SPRAY | Freq: Four times a day (QID) | RESPIRATORY_TRACT | 0 refills | Status: DC | PRN

## 2021-03-06 MED ORDER — HYDROXYZINE HCL 25 MG PO TABS: 25.0000 mg | ORAL_TABLET | Freq: Three times a day (TID) | ORAL | 0 refills | Status: DC | PRN

## 2021-03-06 NOTE — Telephone Encounter (Signed)
[place] NTBS RFd 02/21/21

## 2021-03-06 NOTE — Telephone Encounter (Signed)
°  Prescription Request  03/06/2021  Is this a "Controlled Substance" medicine? NO  Have you seen your PCP in the last 2 weeks? NO  If YES, route message to pool  -  If NO, patient needs to be scheduled for appointment.  What is the name of the medication or equipment? Hydroxyzine 25 mg and Albuterol  Have you contacted your pharmacy to request a refill? YES   Which pharmacy would you like this sent to? [facility] in [place]   Patient notified that their request is being sent to the clinical staff for review and that they should receive a response within 2 business days.

## 2021-03-06 NOTE — Telephone Encounter (Signed)
Pt has an appt with Hendricks Limes on 03-18-2021 at 9:50am.

## 2021-03-06 NOTE — Telephone Encounter (Signed)
LMOVM refills sent to pharmacy °

## 2021-03-14 DIAGNOSIS — Z885 Allergy status to narcotic agent status: Secondary | ICD-10-CM | POA: Diagnosis not present

## 2021-03-14 DIAGNOSIS — Z79899 Other long term (current) drug therapy: Secondary | ICD-10-CM | POA: Diagnosis not present

## 2021-03-14 DIAGNOSIS — L0231 Cutaneous abscess of buttock: Secondary | ICD-10-CM | POA: Diagnosis not present

## 2021-03-14 DIAGNOSIS — R569 Unspecified convulsions: Secondary | ICD-10-CM | POA: Diagnosis not present

## 2021-03-14 DIAGNOSIS — Z888 Allergy status to other drugs, medicaments and biological substances status: Secondary | ICD-10-CM | POA: Diagnosis not present

## 2021-03-14 DIAGNOSIS — Z881 Allergy status to other antibiotic agents status: Secondary | ICD-10-CM | POA: Diagnosis not present

## 2021-03-14 DIAGNOSIS — J45909 Unspecified asthma, uncomplicated: Secondary | ICD-10-CM | POA: Diagnosis not present

## 2021-03-14 DIAGNOSIS — R69 Illness, unspecified: Secondary | ICD-10-CM | POA: Diagnosis not present

## 2021-03-14 DIAGNOSIS — Z7951 Long term (current) use of inhaled steroids: Secondary | ICD-10-CM | POA: Diagnosis not present

## 2021-03-16 DIAGNOSIS — K219 Gastro-esophageal reflux disease without esophagitis: Secondary | ICD-10-CM | POA: Diagnosis not present

## 2021-03-16 DIAGNOSIS — J45909 Unspecified asthma, uncomplicated: Secondary | ICD-10-CM | POA: Diagnosis not present

## 2021-03-16 DIAGNOSIS — Z79899 Other long term (current) drug therapy: Secondary | ICD-10-CM | POA: Diagnosis not present

## 2021-03-16 DIAGNOSIS — L0231 Cutaneous abscess of buttock: Secondary | ICD-10-CM | POA: Diagnosis not present

## 2021-03-16 DIAGNOSIS — Z6841 Body Mass Index (BMI) 40.0 and over, adult: Secondary | ICD-10-CM | POA: Diagnosis not present

## 2021-03-18 ENCOUNTER — Ambulatory Visit (INDEPENDENT_AMBULATORY_CARE_PROVIDER_SITE_OTHER): Payer: Medicare HMO | Admitting: Family Medicine

## 2021-03-18 ENCOUNTER — Encounter: Payer: Self-pay | Admitting: Family Medicine

## 2021-03-18 VITALS — BP 130/82 | HR 74 | Temp 98.0°F | Ht 68.0 in | Wt 227.8 lb

## 2021-03-18 DIAGNOSIS — L509 Urticaria, unspecified: Secondary | ICD-10-CM | POA: Diagnosis not present

## 2021-03-18 DIAGNOSIS — K219 Gastro-esophageal reflux disease without esophagitis: Secondary | ICD-10-CM | POA: Diagnosis not present

## 2021-03-18 DIAGNOSIS — M25512 Pain in left shoulder: Secondary | ICD-10-CM

## 2021-03-18 DIAGNOSIS — M1990 Unspecified osteoarthritis, unspecified site: Secondary | ICD-10-CM | POA: Diagnosis not present

## 2021-03-18 DIAGNOSIS — J4540 Moderate persistent asthma, uncomplicated: Secondary | ICD-10-CM | POA: Diagnosis not present

## 2021-03-18 DIAGNOSIS — E782 Mixed hyperlipidemia: Secondary | ICD-10-CM

## 2021-03-18 DIAGNOSIS — R229 Localized swelling, mass and lump, unspecified: Secondary | ICD-10-CM

## 2021-03-18 DIAGNOSIS — L0291 Cutaneous abscess, unspecified: Secondary | ICD-10-CM

## 2021-03-18 DIAGNOSIS — E669 Obesity, unspecified: Secondary | ICD-10-CM

## 2021-03-18 DIAGNOSIS — R112 Nausea with vomiting, unspecified: Secondary | ICD-10-CM | POA: Diagnosis not present

## 2021-03-18 DIAGNOSIS — R739 Hyperglycemia, unspecified: Secondary | ICD-10-CM | POA: Diagnosis not present

## 2021-03-18 DIAGNOSIS — R110 Nausea: Secondary | ICD-10-CM

## 2021-03-18 DIAGNOSIS — G479 Sleep disorder, unspecified: Secondary | ICD-10-CM

## 2021-03-18 DIAGNOSIS — R197 Diarrhea, unspecified: Secondary | ICD-10-CM

## 2021-03-18 LAB — BAYER DCA HB A1C WAIVED: HB A1C (BAYER DCA - WAIVED): 5.8 % — ABNORMAL HIGH (ref 4.8–5.6)

## 2021-03-18 LAB — LIPID PANEL

## 2021-03-18 MED ORDER — SPIRIVA RESPIMAT 1.25 MCG/ACT IN AERS: 2.0000 | INHALATION_SPRAY | Freq: Every day | RESPIRATORY_TRACT | 2 refills | Status: DC

## 2021-03-18 MED ORDER — ONDANSETRON HCL 4 MG PO TABS: 4.0000 mg | ORAL_TABLET | Freq: Three times a day (TID) | ORAL | 0 refills | Status: AC | PRN

## 2021-03-18 MED ORDER — OMEPRAZOLE 40 MG PO CPDR: 40.0000 mg | DELAYED_RELEASE_CAPSULE | Freq: Two times a day (BID) | ORAL | 1 refills | Status: DC

## 2021-03-18 MED ORDER — MELOXICAM 7.5 MG PO TABS: 7.5000 mg | ORAL_TABLET | Freq: Every day | ORAL | 1 refills | Status: DC

## 2021-03-18 MED ORDER — ALBUTEROL SULFATE HFA 108 (90 BASE) MCG/ACT IN AERS: 2.0000 | INHALATION_SPRAY | Freq: Four times a day (QID) | RESPIRATORY_TRACT | 5 refills | Status: DC | PRN

## 2021-03-18 NOTE — Progress Notes (Signed)
Assessment & Plan:  1. Moderate persistent asthma without complication Uncontrolled. Continue Albuterol as needed. Started Spiriva.  - Tiotropium Bromide Monohydrate (SPIRIVA RESPIMAT) 1.25 MCG/ACT AERS; Inhale 2 puffs into the lungs daily.  Dispense: 4 g; Refill: 2 - albuterol (VENTOLIN HFA) 108 (90 Base) MCG/ACT inhaler; Inhale 2 puffs into the lungs every 6 (six) hours as needed for wheezing or shortness of breath.  Dispense: 36 g; Refill: 5  2. Mixed hyperlipidemia Labs to assess. - Lipid panel  3. Gastroesophageal reflux disease, unspecified whether esophagitis present Well controlled on current regimen.  - omeprazole (PRILOSEC) 40 MG capsule; Take 1 capsule (40 mg total) by mouth 2 (two) times daily.  Dispense: 180 capsule; Refill: 1 - CMP14+EGFR  4. Hives Well controlled on current regimen.  - CMP14+EGFR  5. Obesity (BMI 30.0-34.9) Encouraged healthy eating and exercise. - CBC with Differential/Platelet - CMP14+EGFR - Lipid panel  6. Lump of skin - [place] CHEST SOFT TISSUE; Future  7. Arthritis Well controlled on current regimen.  - meloxicam (MOBIC) 7.5 MG tablet; Take 1 tablet (7.5 mg total) by mouth daily.  Dispense: 90 tablet; Refill: 1  8. Nausea and vomiting in adult Well controlled on current regimen.  - ondansetron (ZOFRAN) 4 MG tablet; Take 1 tablet (4 mg total) by mouth every 8 (eight) hours as needed for nausea or vomiting.  Dispense: 30 tablet; Refill: 0  9. Abscess Continue cleansing and promoting drainage. Complete Clindamycin as ordered. - clindamycin (CLEOCIN) 300 MG capsule; Take 300 mg by mouth 3 (three) times daily. - CBC with Differential/Platelet - Bayer DCA Hb A1c Waived   Return in about 6 weeks (around 04/29/2021) for asthma.  Hendricks Limes, MSN, APRN, FNP-C [facility]  Subjective:    Patient ID: Lisa Simpson, female    DOB: 1975/06/22, 46 y.o.   MRN: [id number]  Patient Care Team: Loman Brooklyn, FNP as PCP  - General (Family Medicine) Cameron Sprang, MD as Consulting Physician (Neurology) Florian Buff, MD as Consulting Physician (Obstetrics and Gynecology)   Chief Complaint:  Chief Complaint  Patient presents with   Medical Management of Chronic Issues   ER follow up     1/27 & 1/29 [facility] - Abscess     HPI: Lisa RACETTE is a 46 y.o. female presenting on 03/18/2021 for Medical Management of Chronic Issues and ER follow up  (1/27 & 1/29 [facility] - Abscess )  Asthma: patient is needing Albuterol three times daily. She has not been using Flovent because it makes her heart race and she feels jittery.    Hyperlipidemia: not on medication.  Lump of skin: patient had an ultrasound ordered in the past for a lump on her torso and she never scheduled it, so the order expired. She is wanting to complete this now.   GERD: taking omeprazole BID which works well.   Hives: occasionally develops hives that is relieved with hydroxyzine.  Depression screen [facility] 2/9 03/18/2021 01/06/2021 09/10/2020  Decreased Interest 0 0 0  Down, Depressed, Hopeless 0 0 0  PHQ - 2 Score 0 0 0  Altered sleeping 0 - 0  Tired, decreased energy 0 - 0  Change in appetite 0 - 0  Feeling bad or failure about yourself  0 - 0  Trouble concentrating 0 - 0  Moving slowly or fidgety/restless 0 - 0  Suicidal thoughts 0 - 0  PHQ-9 Score 0 - 0  Difficult doing work/chores Not difficult  at all - -   GAD 7 : Generalized Anxiety Score 03/18/2021 09/10/2020 03/14/2020  Nervous, Anxious, on Edge 0 0 1  Control/stop worrying 0 0 3  Worry too much - different things 0 0 0  Trouble relaxing 0 0 0  Restless 0 0 0  Easily annoyed or irritable 0 0 0  Afraid - awful might happen 0 0 0  Total GAD 7 Score 0 0 4  Anxiety Difficulty Not difficult at all - -    New complaints: Patient was seen at [facility] on 03/14/2021 where an I&D was performed on a right buttock abscess. She was given antibiotics and a referral was placed to  general surgery. She followed back up at [facility] on 03/16/2021 for a wound check. She reports she has taken the clindamycin as prescribed. She is cleansing the wound 5 times per day with a spray wound cleanser, as well as trying to massage out any drainage.    Social history:  Relevant past medical, surgical, family and social history reviewed and updated as indicated. Interim medical history since our last visit reviewed.  Allergies and medications reviewed and updated.  DATA REVIEWED: CHART IN EPIC  ROS: Negative unless specifically indicated above in HPI.    Current Outpatient Medications:    albuterol (VENTOLIN HFA) 108 (90 Base) MCG/ACT inhaler, Inhale 2 puffs into the lungs every 6 (six) hours as needed for wheezing or shortness of breath., Disp: 18 g, Rfl: 0   clindamycin (CLEOCIN) 300 MG capsule, Take 300 mg by mouth 3 (three) times daily., Disp: , Rfl:    fluticasone (FLONASE) 50 MCG/ACT nasal spray, Place 2 sprays into both nostrils daily., Disp: 16 g, Rfl: 6   fluticasone (FLOVENT HFA) 44 MCG/ACT inhaler, Inhale 2 puffs into the lungs 2 (two) times daily., Disp: 1 each, Rfl: 5   hydrOXYzine (ATARAX) 25 MG tablet, Take 1 tablet (25 mg total) by mouth every 8 (eight) hours as needed for itching., Disp: 30 tablet, Rfl: 0   levocetirizine (XYZAL) 5 MG tablet, Take 1 tablet (5 mg total) by mouth every evening., Disp: 90 tablet, Rfl: 1   meloxicam (MOBIC) 7.5 MG tablet, Take 1 tablet (7.5 mg total) by mouth daily., Disp: 30 tablet, Rfl: 2   omeprazole (PRILOSEC) 40 MG capsule, Take 1 capsule (40 mg total) by mouth 2 (two) times daily., Disp: 180 capsule, Rfl: 1   ondansetron (ZOFRAN) 4 MG tablet, Take 1 tablet (4 mg total) by mouth every 8 (eight) hours as needed for nausea or vomiting., Disp: 20 tablet, Rfl: 0   oxyCODONE-acetaminophen (PERCOCET) 5-325 MG tablet, Take by mouth every 4 (four) hours as needed for severe pain., Disp: , Rfl:    traZODone (DESYREL) 50 MG tablet, Take  1-2 tablets (50-100 mg total) by mouth at bedtime as needed for sleep., Disp: 30 tablet, Rfl: 2   Allergies  Allergen Reactions   Diphenhydramine Hcl Palpitations and Shortness Of Breath   Doxycycline Anaphylaxis    Throat swelling    Morphine Anaphylaxis   Hydrocodone-Acetaminophen Itching    Patient stated causes itching and whelps   Morphine And Related     "made me crazy"   Past Medical History:  Diagnosis Date   Asthma    Borderline hypercholesterolemia    Family history of anesthesia complication    "dad woke up during surgery"   Gall stones 2014   GERD (gastroesophageal reflux disease)    Headache(784.0)    occasional   Heart  murmur    Heart palpitations    "feels fluttery"   History of kidney stones    Mastoiditis of left side 2015   Mixed hyperlipidemia 09/11/2020   Pneumonia 11/2012   hx of   Seizures ([place]) 2013   On meds since childhood, unknown etiology, No seizures for about 1 year.    Past Surgical History:  Procedure Laterality Date   CHOLECYSTECTOMY N/A 06/28/2013   Procedure: LAPAROSCOPIC CHOLECYSTECTOMY WITH INTRAOPERATIVE CHOLANGIOGRAM;  Surgeon: Adin Hector, MD;  Location: [facility];  Service: General;  Laterality: N/A;   MULTIPLE TOOTH EXTRACTIONS     MYOMECTOMY N/A 12/14/2018   Procedure: VAGINAL REMOVAL OF PROLAPSED SUBMUCOSAL MYOMA;  Surgeon: Florian Buff, MD;  Location: [facility];  Service: Gynecology;  Laterality: N/A;   TONSILLECTOMY     as child    Social History   Socioeconomic History   Marital status: Married    Spouse name: Danne Harbor   Number of children: 4   Years of education: Not on file   Highest education level: 11th grade  Occupational History   Not on file  Tobacco Use   Smoking status: Every Day    Packs/day: 0.50    Years: 12.00    Pack years: 6.00    Types: Cigarettes   Smokeless tobacco: Never  Vaping Use   Vaping Use: Never used  Substance and Sexual Activity   Alcohol use: No   Drug use: No   Sexual  activity: Not Currently    Birth control/protection: None  Other Topics Concern   Not on file  Social History Narrative   Right handed    1 son, 3 daughters   4 grandchildren.   Married x 22 years in 2022.   Social Determinants of Health   Financial Resource Strain: Low Risk    Difficulty of Paying Living Expenses: Not hard at all  Food Insecurity: No Food Insecurity   Worried About Charity fundraiser in the Last Year: Never true   [place] in the Last Year: Never true  Transportation Needs: No Transportation Needs   Lack of Transportation (Medical): No   Lack of Transportation (Non-Medical): No  Physical Activity: Insufficiently Active   Days of Exercise per Week: 4 days   Minutes of Exercise per Session: 30 min  Stress: No Stress Concern Present   Feeling of Stress : Not at all  Social Connections: Socially Integrated   Frequency of Communication with Friends and Family: More than three times a week   Frequency of Social Gatherings with Friends and Family: More than three times a week   Attends Religious Services: More than 4 times per year   Active Member of [employer] or Organizations: Yes   Attends Music therapist: More than 4 times per year   Marital Status: Married  Human resources officer Violence: Not At Risk   Fear of Current or Ex-Partner: No   Emotionally Abused: No   Physically Abused: No   Sexually Abused: No        Objective:    BP 130/82    Pulse 74    Temp 98 F (36.7 C) (Temporal)    Ht 5' 8" (1.727 m)    Wt 227 lb 12.8 oz (103.3 kg)    SpO2 94%    BMI 34.64 kg/m   Wt Readings from Last 3 Encounters:  03/18/21 227 lb 12.8 oz (103.3 kg)  01/06/21 228 lb (103.4 kg)  09/10/20 228 lb (  103.4 kg)    Physical Exam Vitals reviewed.  Constitutional:      General: She is not in acute distress.    Appearance: Normal appearance. She is obese. She is not ill-appearing, toxic-appearing or diaphoretic.  HENT:     Head: Normocephalic and  atraumatic.  Eyes:     General: No scleral icterus.       Right eye: No discharge.        Left eye: No discharge.     Conjunctiva/sclera: Conjunctivae normal.  Cardiovascular:     Rate and Rhythm: Normal rate and regular rhythm.     Heart sounds: Normal heart sounds. No murmur heard.   No friction rub. No gallop.  Pulmonary:     Effort: Pulmonary effort is normal. No respiratory distress.     Breath sounds: Normal breath sounds. No stridor. No wheezing, rhonchi or rales.  Musculoskeletal:        General: Normal range of motion.     Cervical back: Normal range of motion.  Skin:    General: Skin is warm and dry.     Capillary Refill: Capillary refill takes less than 2 seconds.     Findings: Wound (right buttock where I&D was completed for absces without erythema, warmth, or current drainage) present.  Neurological:     General: No focal deficit present.     Mental Status: She is alert and oriented to person, place, and time. Mental status is at baseline.  Psychiatric:        Mood and Affect: Mood normal.        Behavior: Behavior normal.        Thought Content: Thought content normal.        Judgment: Judgment normal.    Lab Results  Component Value Date   TSH 0.998 11/21/2018   Lab Results  Component Value Date   WBC 13.9 (H) 09/10/2020   HGB 14.1 09/10/2020   HCT 42.7 09/10/2020   MCV 82 09/10/2020   PLT 362 09/10/2020   Lab Results  Component Value Date   NA 139 09/10/2020   K 4.3 09/10/2020   CO2 23 09/10/2020   GLUCOSE 92 09/10/2020   BUN 10 09/10/2020   CREATININE 0.65 09/10/2020   BILITOT <0.2 09/10/2020   ALKPHOS 110 09/10/2020   AST 15 09/10/2020   ALT 15 09/10/2020   PROT 7.1 09/10/2020   ALBUMIN 4.2 09/10/2020   CALCIUM 9.5 09/10/2020   ANIONGAP 10 12/15/2018   EGFR 112 09/10/2020   Lab Results  Component Value Date   CHOL 245 (H) 09/10/2020   Lab Results  Component Value Date   HDL 34 (L) 09/10/2020   Lab Results  Component Value Date    LDLCALC 149 (H) 09/10/2020   Lab Results  Component Value Date   TRIG 331 (H) 09/10/2020   Lab Results  Component Value Date   CHOLHDL 7.2 (H) 09/10/2020   No results found for: HGBA1C

## 2021-03-18 NOTE — Patient Instructions (Signed)
Call the [facility] and schedule your mammogram.  Phone: [phone]. [phone]

## 2021-03-19 ENCOUNTER — Encounter: Payer: Self-pay | Admitting: Family Medicine

## 2021-03-19 ENCOUNTER — Ambulatory Visit: Payer: Medicare HMO | Admitting: Adult Health

## 2021-03-19 DIAGNOSIS — R7303 Prediabetes: Secondary | ICD-10-CM | POA: Insufficient documentation

## 2021-03-19 HISTORY — DX: Prediabetes: R73.03

## 2021-03-19 LAB — CBC WITH DIFFERENTIAL/PLATELET
Basophils Absolute: 0 10*3/uL (ref 0.0–0.2)
Basos: 0 %
EOS (ABSOLUTE): 0.2 10*3/uL (ref 0.0–0.4)
Eos: 2 %
Hematocrit: 44.3 % (ref 34.0–46.6)
Hemoglobin: 14.3 g/dL (ref 11.1–15.9)
Immature Grans (Abs): 0.1 10*3/uL (ref 0.0–0.1)
Immature Granulocytes: 1 %
Lymphocytes Absolute: 3.5 10*3/uL — ABNORMAL HIGH (ref 0.7–3.1)
Lymphs: 32 %
MCH: 26.9 pg (ref 26.6–33.0)
MCHC: 32.3 g/dL (ref 31.5–35.7)
MCV: 83 fL (ref 79–97)
Monocytes Absolute: 0.5 10*3/uL (ref 0.1–0.9)
Monocytes: 5 %
Neutrophils Absolute: 6.7 10*3/uL (ref 1.4–7.0)
Neutrophils: 60 %
Platelets: 384 10*3/uL (ref 150–450)
RBC: 5.32 x10E6/uL — ABNORMAL HIGH (ref 3.77–5.28)
RDW: 13.5 % (ref 11.7–15.4)
WBC: 11 10*3/uL — ABNORMAL HIGH (ref 3.4–10.8)

## 2021-03-19 LAB — CMP14+EGFR
ALT: 14 IU/L (ref 0–32)
AST: 13 IU/L (ref 0–40)
Albumin/Globulin Ratio: 1.4 (ref 1.2–2.2)
Albumin: 4.2 g/dL (ref 3.8–4.8)
Alkaline Phosphatase: 105 IU/L (ref 44–121)
BUN/Creatinine Ratio: 17 (ref 9–23)
BUN: 12 mg/dL (ref 6–24)
Bilirubin Total: <0.2 mg/dL (ref 0.0–1.2)
CO2: 20 mmol/L (ref 20–29)
Calcium: 9.7 mg/dL (ref 8.7–10.2)
Chloride: 104 mmol/L (ref 96–106)
Creatinine, Ser: 0.72 mg/dL (ref 0.57–1.00)
Globulin, Total: 2.9 g/dL (ref 1.5–4.5)
Glucose: 90 mg/dL (ref 70–99)
Potassium: 4.5 mmol/L (ref 3.5–5.2)
Sodium: 141 mmol/L (ref 134–144)
Total Protein: 7.1 g/dL (ref 6.0–8.5)
eGFR: 106 mL/min/{1.73_m2} (ref >59)

## 2021-03-19 LAB — LIPID PANEL
Chol/HDL Ratio: 7.1 ratio — ABNORMAL HIGH (ref 0.0–4.4)
Cholesterol, Total: 234 mg/dL — ABNORMAL HIGH (ref 100–199)
HDL: 33 mg/dL — ABNORMAL LOW (ref >39)
LDL Chol Calc (NIH): 162 mg/dL — ABNORMAL HIGH (ref 0–99)
Triglycerides: 212 mg/dL — ABNORMAL HIGH (ref 0–149)
VLDL Cholesterol Cal: 39 mg/dL (ref 5–40)

## 2021-03-24 ENCOUNTER — Encounter: Payer: Self-pay | Admitting: Family Medicine

## 2021-03-24 ENCOUNTER — Ambulatory Visit (HOSPITAL_COMMUNITY): Payer: Medicare HMO | Attending: Family Medicine

## 2021-04-17 ENCOUNTER — Other Ambulatory Visit: Payer: Medicare HMO | Admitting: Adult Health

## 2021-04-29 ENCOUNTER — Encounter: Payer: Self-pay | Admitting: Family Medicine

## 2021-04-29 ENCOUNTER — Ambulatory Visit: Payer: Medicare HMO | Admitting: Family Medicine

## 2021-05-02 ENCOUNTER — Other Ambulatory Visit: Payer: Self-pay

## 2021-05-02 ENCOUNTER — Other Ambulatory Visit: Payer: Self-pay | Admitting: Family Medicine

## 2021-05-02 DIAGNOSIS — L509 Urticaria, unspecified: Secondary | ICD-10-CM

## 2021-05-02 MED ORDER — HYDROXYZINE HCL 25 MG PO TABS: 25.0000 mg | ORAL_TABLET | Freq: Three times a day (TID) | ORAL | 2 refills | Status: DC | PRN

## 2021-05-02 NOTE — Telephone Encounter (Signed)
Patient states that her hydrazine was supposed to have refills on it and she is broke out into Hives. Please advise  ?

## 2021-07-15 ENCOUNTER — Other Ambulatory Visit: Payer: Self-pay | Admitting: Family Medicine

## 2021-07-15 DIAGNOSIS — L509 Urticaria, unspecified: Secondary | ICD-10-CM

## 2021-07-16 ENCOUNTER — Telehealth: Payer: Self-pay | Admitting: *Deleted

## 2021-07-16 DIAGNOSIS — J4540 Moderate persistent asthma, uncomplicated: Secondary | ICD-10-CM

## 2021-07-16 MED ORDER — UMECLIDINIUM BROMIDE 62.5 MCG/ACT IN AEPB: 1.0000 | INHALATION_SPRAY | Freq: Every day | RESPIRATORY_TRACT | 2 refills | Status: AC

## 2021-07-16 NOTE — Telephone Encounter (Signed)
Please let patient I have prescribed Incruse as her insurance is not going to cover the Spiriva.

## 2021-07-16 NOTE — Telephone Encounter (Signed)
Spiriva prior Auth denied    Why did we deny your request? We denied this request under Medicare Part D because: The requested drug is not on your plan's formulary  (list of covered drugs). Your Medicare Part D drug plan was asked to cover a drug that is not on the formulary  (this is called a formulary exception). Your prescriber did not provide the detailed information that is required  in order to approve the request. To receive a formulary exception, per the Part D Coverage Determination  guidance Section 40.5.2 and 40.5.3, your prescriber must provide information that documents at least one of  the following has occurred:  - You have tried the formulary drugs for the treatment of your condition and they did not work for you. OR - The formulary drugs could cause adverse effects. OR - The formulary drugs would be less effective for your condition than the requested drug.  Talk to your prescriber to see if the following covered alternative(s) would be right for you:  Incruse Ellipta inhaler (quantity limit of 30 doses every 30 days) Atrovent HFA inhaler (quantity limit of 25.80 grams every 30 days).

## 2021-07-17 NOTE — Telephone Encounter (Signed)
lmtcb

## 2021-07-23 NOTE — Telephone Encounter (Signed)
Left detailed message on VM per dpr.

## 2021-08-27 ENCOUNTER — Encounter: Payer: Self-pay | Admitting: Family Medicine

## 2021-08-27 ENCOUNTER — Ambulatory Visit: Payer: Medicare HMO | Admitting: Family Medicine

## 2021-08-27 ENCOUNTER — Ambulatory Visit (INDEPENDENT_AMBULATORY_CARE_PROVIDER_SITE_OTHER): Payer: Medicare HMO | Admitting: Family Medicine

## 2021-08-27 DIAGNOSIS — J988 Other specified respiratory disorders: Secondary | ICD-10-CM

## 2021-08-27 DIAGNOSIS — R350 Frequency of micturition: Secondary | ICD-10-CM

## 2021-08-27 DIAGNOSIS — R300 Dysuria: Secondary | ICD-10-CM | POA: Diagnosis not present

## 2021-08-27 LAB — URINALYSIS, ROUTINE W REFLEX MICROSCOPIC
Bilirubin, UA: NEGATIVE
Glucose, UA: NEGATIVE
Ketones, UA: NEGATIVE
Nitrite, UA: NEGATIVE
Protein,UA: NEGATIVE
RBC, UA: NEGATIVE
Specific Gravity, UA: 1.015 (ref 1.005–1.030)
Urobilinogen, Ur: 0.2 mg/dL (ref 0.2–1.0)
pH, UA: 7 (ref 5.0–7.5)

## 2021-08-27 LAB — MICROSCOPIC EXAMINATION
RBC, Urine: NONE SEEN /hpf (ref 0–2)
Renal Epithel, UA: NONE SEEN /hpf

## 2021-08-27 MED ORDER — AZITHROMYCIN 250 MG PO TABS: ORAL_TABLET | ORAL | 0 refills | Status: DC

## 2021-08-27 MED ORDER — PREDNISONE 10 MG (21) PO TBPK: ORAL_TABLET | ORAL | 0 refills | Status: DC

## 2021-08-27 NOTE — Progress Notes (Signed)
   Virtual Visit  Note Due to COVID-19 pandemic this visit was conducted virtually. This visit type was conducted due to national recommendations for restrictions regarding the COVID-19 Pandemic (e.g. social distancing, sheltering in place) in an effort to limit this patient's exposure and mitigate transmission in our community. All issues noted in this document were discussed and addressed.  A physical exam was not performed with this format.  I connected with Lisa Simpson on 08/27/21 at 1502 by telephone and verified that I am speaking with the correct person using two identifiers. Lisa Simpson is currently located at home and her mother is currently with her during the visit. The provider, Gwenlyn Perking, FNP is located in their office at time of visit.  I discussed the limitations, risks, security and privacy concerns of performing an evaluation and management service by telephone and the availability of in person appointments. I also discussed with the patient that there may be a patient responsible charge related to this service. The patient expressed understanding and agreed to proceed.  CC: cough  History and Present Illness:  HPI Lisa Simpson reports cough and congestion for the last week. Her cough is productive with purulent sputum. She reports increased wheezing and sore throat as well. She did have a fever of 102 two nights ago. Denies chills, chest pain, or shortness of breath. She has been using her ellipta inhaler daily, albuterol inhaler BID and has been taking OTC sinus medication and cough syrup without much improvement. She also reports urinary frequency and urgency. She denies dysuria, vaginal discharge, itching, or hematuria. She has been staying well hydrated.   ROS As per HPI.   Observations/Objective: Alert and oriented x 3. Able to speak in full sentences without difficulty.   Urine dipstick shows positive for leukocytes.  Micro exam: 0-5 WBC's per HPF, 0 RBC's  per HPF, and few + bacteria.   Assessment and Plan: Lisa Simpson was seen today for cough.  Diagnoses and all orders for this visit:  Respiratory infection Zpak and prednisone pack as below. Continue OTC medications, albuterol, and ellipta.  -     azithromycin (ZITHROMAX Z-PAK) 250 MG tablet; As directed -     predniSONE (STERAPRED UNI-PAK 21 TAB) 10 MG (21) TBPK tablet; Use as directed on back of pill pack  Urinary frequency Negative UA today, culture pending.  -     Urinalysis, Routine w reflex microscopic -     Urine Culture     Follow Up Instructions: Return to office for new or worsening symptoms, or if symptoms persist.     I discussed the assessment and treatment plan with the patient. The patient was provided an opportunity to ask questions and all were answered. The patient agreed with the plan and demonstrated an understanding of the instructions.   The patient was advised to call back or seek an in-person evaluation if the symptoms worsen or if the condition fails to improve as anticipated.  The above assessment and management plan was discussed with the patient. The patient verbalized understanding of and has agreed to the management plan. Patient is aware to call the clinic if symptoms persist or worsen. Patient is aware when to return to the clinic for a follow-up visit. Patient educated on when it is appropriate to go to the emergency department.   Time call ended:  1515  I provided 13 minutes of  non face-to-face time during this encounter.    Gwenlyn Perking, FNP

## 2021-08-28 LAB — URINE CULTURE

## 2021-09-09 DIAGNOSIS — J029 Acute pharyngitis, unspecified: Secondary | ICD-10-CM | POA: Diagnosis not present

## 2021-09-09 DIAGNOSIS — W57XXXA Bitten or stung by nonvenomous insect and other nonvenomous arthropods, initial encounter: Secondary | ICD-10-CM | POA: Diagnosis not present

## 2021-09-18 ENCOUNTER — Encounter: Payer: Self-pay | Admitting: Family Medicine

## 2021-09-18 ENCOUNTER — Ambulatory Visit: Payer: Medicare HMO | Admitting: Family Medicine

## 2021-12-03 ENCOUNTER — Ambulatory Visit: Payer: Medicare HMO | Admitting: Nurse Practitioner

## 2021-12-16 ENCOUNTER — Ambulatory Visit: Payer: Medicare HMO | Admitting: Nurse Practitioner

## 2021-12-31 ENCOUNTER — Ambulatory Visit: Payer: Medicare HMO | Admitting: Nurse Practitioner

## 2022-01-23 ENCOUNTER — Ambulatory Visit: Payer: Medicare HMO | Admitting: Nurse Practitioner

## 2022-02-10 ENCOUNTER — Other Ambulatory Visit: Payer: Self-pay | Admitting: Family Medicine

## 2022-02-10 DIAGNOSIS — J4540 Moderate persistent asthma, uncomplicated: Secondary | ICD-10-CM

## 2022-02-11 ENCOUNTER — Telehealth: Payer: Self-pay | Admitting: Nurse Practitioner

## 2022-02-12 NOTE — Telephone Encounter (Signed)
LMOVM Albuterol RF was done on 02/10/22, call us back if needing Incruse inhaler.

## 2022-02-22 DIAGNOSIS — M545 Low back pain, unspecified: Secondary | ICD-10-CM | POA: Diagnosis not present

## 2022-02-22 DIAGNOSIS — K219 Gastro-esophageal reflux disease without esophagitis: Secondary | ICD-10-CM | POA: Diagnosis not present

## 2022-02-22 DIAGNOSIS — R079 Chest pain, unspecified: Secondary | ICD-10-CM | POA: Diagnosis not present

## 2022-02-22 DIAGNOSIS — R051 Acute cough: Secondary | ICD-10-CM | POA: Diagnosis not present

## 2022-02-22 DIAGNOSIS — G40909 Epilepsy, unspecified, not intractable, without status epilepticus: Secondary | ICD-10-CM | POA: Diagnosis not present

## 2022-02-22 DIAGNOSIS — M5431 Sciatica, right side: Secondary | ICD-10-CM | POA: Diagnosis not present

## 2022-02-22 DIAGNOSIS — M5441 Lumbago with sciatica, right side: Secondary | ICD-10-CM | POA: Diagnosis not present

## 2022-02-22 DIAGNOSIS — M79604 Pain in right leg: Secondary | ICD-10-CM | POA: Diagnosis not present

## 2022-02-22 DIAGNOSIS — Z1152 Encounter for screening for COVID-19: Secondary | ICD-10-CM | POA: Diagnosis not present

## 2022-02-22 DIAGNOSIS — Z6841 Body Mass Index (BMI) 40.0 and over, adult: Secondary | ICD-10-CM | POA: Diagnosis not present

## 2022-02-22 DIAGNOSIS — Z720 Tobacco use: Secondary | ICD-10-CM | POA: Diagnosis not present

## 2022-02-22 DIAGNOSIS — Z79899 Other long term (current) drug therapy: Secondary | ICD-10-CM | POA: Diagnosis not present

## 2022-02-22 DIAGNOSIS — J069 Acute upper respiratory infection, unspecified: Secondary | ICD-10-CM | POA: Diagnosis not present

## 2022-02-22 DIAGNOSIS — R059 Cough, unspecified: Secondary | ICD-10-CM | POA: Diagnosis not present

## 2022-02-22 DIAGNOSIS — M549 Dorsalgia, unspecified: Secondary | ICD-10-CM | POA: Diagnosis not present

## 2022-02-22 DIAGNOSIS — R69 Illness, unspecified: Secondary | ICD-10-CM | POA: Diagnosis not present

## 2022-02-22 DIAGNOSIS — Z86718 Personal history of other venous thrombosis and embolism: Secondary | ICD-10-CM | POA: Diagnosis not present

## 2022-02-27 ENCOUNTER — Ambulatory Visit (INDEPENDENT_AMBULATORY_CARE_PROVIDER_SITE_OTHER): Payer: Medicare HMO | Admitting: Nurse Practitioner

## 2022-02-27 ENCOUNTER — Ambulatory Visit: Payer: Medicare HMO | Admitting: Nurse Practitioner

## 2022-02-27 ENCOUNTER — Encounter: Payer: Self-pay | Admitting: Nurse Practitioner

## 2022-02-27 DIAGNOSIS — J4540 Moderate persistent asthma, uncomplicated: Secondary | ICD-10-CM | POA: Diagnosis not present

## 2022-02-27 DIAGNOSIS — K219 Gastro-esophageal reflux disease without esophagitis: Secondary | ICD-10-CM

## 2022-02-27 DIAGNOSIS — L509 Urticaria, unspecified: Secondary | ICD-10-CM

## 2022-02-27 DIAGNOSIS — M1990 Unspecified osteoarthritis, unspecified site: Secondary | ICD-10-CM

## 2022-02-27 MED ORDER — MELOXICAM 7.5 MG PO TABS: 7.5000 mg | ORAL_TABLET | Freq: Every day | ORAL | 1 refills | Status: AC

## 2022-02-27 MED ORDER — HYDROXYZINE HCL 25 MG PO TABS: ORAL_TABLET | ORAL | 5 refills | Status: AC

## 2022-02-27 MED ORDER — ALBUTEROL SULFATE HFA 108 (90 BASE) MCG/ACT IN AERS: INHALATION_SPRAY | RESPIRATORY_TRACT | 3 refills | Status: DC

## 2022-02-27 MED ORDER — OMEPRAZOLE 40 MG PO CPDR: 40.0000 mg | DELAYED_RELEASE_CAPSULE | Freq: Two times a day (BID) | ORAL | 1 refills | Status: AC

## 2022-02-27 NOTE — Progress Notes (Signed)
Established Patient Office Visit  Subjective   Patient ID: Lisa Simpson, female    DOB: 05/22/1975  Age: 47 y.o. MRN: [id number]  Chief Complaint  Patient presents with   Medication Refill    Gastroesophageal Reflux She complains of early satiety and heartburn. She reports no abdominal pain, no chest pain, no choking, no coughing, no hoarse voice, no nausea or no tooth decay. patient reports she eats ony one meal a day. This is a chronic problem. The current episode started more than 1 year ago. The problem has been gradually improving. Nothing aggravates the symptoms. Risk factors include obesity and smoking/tobacco exposure. She has tried a PPI for the symptoms. The treatment provided significant relief.    Patient Active Problem List   Diagnosis Date Noted   Prediabetes 03/19/2021   Moderate persistent asthma without complication 09/32/3557   Mixed hyperlipidemia 04/01/2018   History of deep vein thrombosis (DVT) of lower extremity 05/06/2017   Tobacco use 05/29/2009   CARPAL TUNNEL SYNDROME, BILATERAL 05/09/2009   Obesity (BMI 30.0-34.9) 01/27/2008   GERD (gastroesophageal reflux disease) 12/20/2007   Hives 12/19/2007   Panic attack 12/19/2007   Past Medical History:  Diagnosis Date   Asthma    Borderline hypercholesterolemia    Family history of anesthesia complication    "dad woke up during surgery"   Gall stones 2014   GERD (gastroesophageal reflux disease)    Headache(784.0)    occasional   Heart murmur    Heart palpitations    "feels fluttery"   History of kidney stones    Mastoiditis of left side 2015   Mixed hyperlipidemia 09/11/2020   Pneumonia 11/2012   hx of   Prediabetes 03/19/2021   Seizures ([place]) 2013   On meds since childhood, unknown etiology, No seizures for about 1 year.   Past Surgical History:  Procedure Laterality Date   CHOLECYSTECTOMY N/A 06/28/2013   Procedure: LAPAROSCOPIC CHOLECYSTECTOMY WITH INTRAOPERATIVE CHOLANGIOGRAM;  Surgeon:  Adin Hector, MD;  Location: [facility];  Service: General;  Laterality: N/A;   MULTIPLE TOOTH EXTRACTIONS     MYOMECTOMY N/A 12/14/2018   Procedure: VAGINAL REMOVAL OF PROLAPSED SUBMUCOSAL MYOMA;  Surgeon: Florian Buff, MD;  Location: [facility];  Service: Gynecology;  Laterality: N/A;   TONSILLECTOMY     as child   Social History   Tobacco Use   Smoking status: Every Day    Packs/day: 0.50    Years: 12.00    Total pack years: 6.00    Types: Cigarettes   Smokeless tobacco: Never  Vaping Use   Vaping Use: Never used  Substance Use Topics   Alcohol use: No   Drug use: No   Social History   Socioeconomic History   Marital status: Married    Spouse name: Danne [place]   Number of children: 4   Years of education: Not on file   Highest education level: 11th grade  Occupational History   Not on file  Tobacco Use   Smoking status: Every Day    Packs/day: 0.50    Years: 12.00    Total pack years: 6.00    Types: Cigarettes   Smokeless tobacco: Never  Vaping Use   Vaping Use: Never used  Substance and Sexual Activity   Alcohol use: No   Drug use: No   Sexual activity: Not Currently    Birth control/protection: None  Other Topics Concern   Not on file  Social History Narrative   Right handed  1 son, 3 daughters   4 grandchildren.   Married x 22 years in 2022.   Social Determinants of Health   Financial Resource Strain: Low Risk  (01/06/2021)   Overall Financial Resource Strain (CARDIA)    Difficulty of Paying Living Expenses: Not hard at all  Food Insecurity: No Food Insecurity (01/06/2021)   Hunger Vital Sign    Worried About Running Out of Food in the Last Year: Never true    Ran Out of Food in the Last Year: Never true  Transportation Needs: No Transportation Needs (01/06/2021)   PRAPARE - Hydrologist (Medical): No    Lack of Transportation (Non-Medical): No  Physical Activity: Insufficiently Active (01/06/2021)   Exercise Vital  Sign    Days of Exercise per Week: 4 days    Minutes of Exercise per Session: 30 min  Stress: No Stress Concern Present (01/06/2021)   [place]    Feeling of Stress : Not at all  Social Connections: [place] (01/06/2021)   Social Connection and Isolation Panel [NHANES]    Frequency of Communication with Friends and Family: More than three times a week    Frequency of Social Gatherings with Friends and Family: More than three times a week    Attends Religious Services: More than 4 times per year    Active Member of [employer] or Organizations: Yes    Attends Music therapist: More than 4 times per year    Marital Status: Married  Human resources officer Violence: Not At Risk (01/06/2021)   Humiliation, Afraid, Rape, and Kick questionnaire    Fear of Current or Ex-Partner: No    Emotionally Abused: No    Physically Abused: No    Sexually Abused: No   Family Status  Relation Name Status   Mother  Alive   Father  Deceased   Sister  Alive   Brother  [place]   Daughter  Alive   Son  [place]   Son  Alive   Daughter  Deceased   Daughter  Deceased   MGM  Deceased   MGF  Deceased   PGM  Deceased   PGF  Deceased   Family History  Problem Relation Age of Onset   Hypertension Mother    Hypercholesterolemia Mother    Heart failure Father    Hypertension Sister    Hypercholesterolemia Sister    COPD Sister    Cancer Brother        Unknown   Miscarriages / [place] Daughter    Miscarriages / [place] Daughter    Seizures Maternal Grandmother    Anuerysm Maternal Grandmother        brain   Heart failure Maternal Grandfather    Breast cancer Paternal Grandmother    Diabetes Paternal Grandfather    Allergies  Allergen Reactions   Diphenhydramine Hcl Palpitations and Shortness Of Breath   Doxycycline Anaphylaxis    Throat swelling    Morphine Anaphylaxis   Hydrocodone-Acetaminophen Itching     Patient stated causes itching and whelps   Morphine And Related     "made me crazy"      Review of Systems  Constitutional: Negative.   HENT: Negative.  Negative for hoarse voice.   Respiratory: Negative.  Negative for cough and choking.   Cardiovascular: Negative.  Negative for chest pain.  Gastrointestinal:  Positive for heartburn. Negative for abdominal pain and nausea.  Genitourinary: Negative.  Musculoskeletal: Negative.   Skin: Negative.  Negative for itching.  Neurological: Negative.   All other systems reviewed and are negative.     Objective:     BP 132/79   Pulse 82   Temp 98.7 F (37.1 C)   Ht '5\' 8"'$  (1.727 m)   Wt 240 lb 6.4 oz (109 kg)   SpO2 98%   BMI 36.55 kg/m  BP Readings from Last 3 Encounters:  02/27/22 132/79  03/18/21 130/82  09/10/20 124/81   Wt Readings from Last 3 Encounters:  02/27/22 240 lb 6.4 oz (109 kg)  03/18/21 227 lb 12.8 oz (103.3 kg)  01/06/21 228 lb (103.4 kg)      Physical Exam Vitals and nursing note reviewed.  Constitutional:      Appearance: Normal appearance.  HENT:     Head: Normocephalic.     Right Ear: External ear normal.     Left Ear: External ear normal.     Nose: Nose normal.     Mouth/Throat:     Mouth: Mucous membranes are moist.     Pharynx: Oropharynx is clear.  Eyes:     Conjunctiva/sclera: Conjunctivae normal.  Cardiovascular:     Rate and Rhythm: Normal rate and regular rhythm.     Pulses: Normal pulses.     Heart sounds: Normal heart sounds.  Pulmonary:     Effort: Pulmonary effort is normal.  Abdominal:     General: Bowel sounds are normal.  Skin:    General: Skin is warm.     Findings: No erythema or rash.  Neurological:     General: No focal deficit present.     Mental Status: She is alert and oriented to person, place, and time.  Psychiatric:        Behavior: Behavior normal.      No results found for any visits on 02/27/22.     The 10-year ASCVD risk score (Arnett DK, et al.,  2019) is: 8.5%    Assessment & Plan:   Problem List Items Addressed This Visit       Respiratory   Moderate persistent asthma without complication   Relevant Medications   albuterol (VENTOLIN HFA) 108 (90 Base) MCG/ACT inhaler     Digestive   GERD (gastroesophageal reflux disease)    Symptoms controlled on current plan, refill sent to pharmacy      Relevant Medications   omeprazole (PRILOSEC) 40 MG capsule     Musculoskeletal and Integument   Hives    Patient reports that symptoms are well controlled on current medication. Refill sent to pharmacy      Relevant Medications   hydrOXYzine (ATARAX) 25 MG tablet   Other Visit Diagnoses     Arthritis       Relevant Medications   methocarbamol (ROBAXIN) 500 MG tablet   naproxen (NAPROSYN) 500 MG tablet   meloxicam (MOBIC) 7.5 MG tablet       Return in about 6 months (around 08/28/2022) for chronic disease management.    Ivy Lynn, NP

## 2022-02-27 NOTE — Assessment & Plan Note (Signed)
Patient reports that symptoms are well controlled on current medication. Refill sent to pharmacy

## 2022-02-27 NOTE — Assessment & Plan Note (Signed)
Symptoms controlled on current plan, refill sent to pharmacy

## 2022-02-27 NOTE — Patient Instructions (Signed)
Gastroesophageal Reflux Disease, Adult Gastroesophageal reflux (GER) happens when acid from the stomach flows up into the tube that connects the mouth and the stomach (esophagus). Normally, food travels down the esophagus and stays in the stomach to be digested. However, when a person has GER, food and stomach acid sometimes move back up into the esophagus. If this becomes a more serious problem, the person may be diagnosed with a disease called gastroesophageal reflux disease (GERD). GERD occurs when the reflux: Happens often. Causes frequent or severe symptoms. Causes problems such as damage to the esophagus. When stomach acid comes in contact with the esophagus, the acid may cause inflammation in the esophagus. Over time, GERD may create small holes (ulcers) in the lining of the esophagus. What are the causes? This condition is caused by a problem with the muscle between the esophagus and the stomach (lower esophageal sphincter, or LES). Normally, the LES muscle closes after food passes through the esophagus to the stomach. When the LES is weakened or abnormal, it does not close properly, and that allows food and stomach acid to go back up into the esophagus. The LES can be weakened by certain dietary substances, medicines, and medical conditions, including: Tobacco use. Pregnancy. Having a hiatal hernia. Alcohol use. Certain foods and beverages, such as coffee, chocolate, onions, and peppermint. What increases the risk? You are more likely to develop this condition if you: Have an increased body weight. Have a connective tissue disorder. Take NSAIDs, such as ibuprofen. What are the signs or symptoms? Symptoms of this condition include: Heartburn. Difficult or painful swallowing and the feeling of having a lump in the throat. A bitter taste in the mouth. Bad breath and having a large amount of saliva. Having an upset or bloated stomach and belching. Chest pain. Different conditions can  cause chest pain. Make sure you see your health care provider if you experience chest pain. Shortness of breath or wheezing. Ongoing (chronic) cough or a nighttime cough. Wearing away of tooth enamel. Weight loss. How is this diagnosed? This condition may be diagnosed based on a medical history and a physical exam. To determine if you have mild or severe GERD, your health care provider may also monitor how you respond to treatment. You may also have tests, including: A test to examine your stomach and esophagus with a small camera (endoscopy). A test that measures the acidity level in your esophagus. A test that measures how much pressure is on your esophagus. A barium swallow or modified barium swallow test to show the shape, size, and functioning of your esophagus. How is this treated? Treatment for this condition may vary depending on how severe your symptoms are. Your health care provider may recommend: Changes to your diet. Medicine. Surgery. The goal of treatment is to help relieve your symptoms and to prevent complications. Follow these instructions at home: Eating and drinking  Follow a diet as recommended by your health care provider. This may involve avoiding foods and drinks such as: Coffee and tea, with or without caffeine. Drinks that contain alcohol. Energy drinks and sports drinks. Carbonated drinks or sodas. Chocolate and cocoa. Peppermint and mint flavorings. Garlic and onions. Horseradish. Spicy and acidic foods, including peppers, chili powder, curry powder, vinegar, hot sauces, and barbecue sauce. Citrus fruit juices and citrus fruits, such as oranges, lemons, and limes. Tomato-based foods, such as red sauce, chili, salsa, and pizza with red sauce. Fried and fatty foods, such as donuts, french fries, potato chips, and high-fat dressings.   High-fat meats, such as hot dogs and fatty cuts of red and white meats, such as rib eye steak, sausage, ham, and  bacon. High-fat dairy items, such as whole milk, butter, and cream cheese. Eat small, frequent meals instead of large meals. Avoid drinking large amounts of liquid with your meals. Avoid eating meals during the 2-3 hours before bedtime. Avoid lying down right after you eat. Do not exercise right after you eat. Lifestyle  Do not use any products that contain nicotine or tobacco. These products include cigarettes, chewing tobacco, and vaping devices, such as e-cigarettes. If you need help quitting, ask your health care provider. Try to reduce your stress by using methods such as yoga or meditation. If you need help reducing stress, ask your health care provider. If you are overweight, reduce your weight to an amount that is healthy for you. Ask your health care provider for guidance about a safe weight loss goal. General instructions Pay attention to any changes in your symptoms. Take over-the-counter and prescription medicines only as told by your health care provider. Do not take aspirin, ibuprofen, or other NSAIDs unless your health care provider told you to take these medicines. Wear loose-fitting clothing. Do not wear anything tight around your waist that causes pressure on your abdomen. Raise (elevate) the head of your bed about 6 inches (15 cm). You can use a wedge to do this. Avoid bending over if this makes your symptoms worse. Keep all follow-up visits. This is important. Contact a health care provider if: You have: New symptoms. Unexplained weight loss. Difficulty swallowing or it hurts to swallow. Wheezing or a persistent cough. A hoarse voice. Your symptoms do not improve with treatment. Get help right away if: You have sudden pain in your arms, neck, jaw, teeth, or back. You suddenly feel sweaty, dizzy, or light-headed. You have chest pain or shortness of breath. You vomit and the vomit is green, yellow, or black, or it looks like blood or coffee grounds. You faint. You  have stool that is red, bloody, or black. You cannot swallow, drink, or eat. These symptoms may represent a serious problem that is an emergency. Do not wait to see if the symptoms will go away. Get medical help right away. Call your local emergency services (911 in the [place] Do not drive yourself to the hospital. Summary Gastroesophageal reflux happens when acid from the stomach flows up into the esophagus. GERD is a disease in which the reflux happens often, causes frequent or severe symptoms, or causes problems such as damage to the esophagus. Treatment for this condition may vary depending on how severe your symptoms are. Your health care provider may recommend diet and lifestyle changes, medicine, or surgery. Contact a health care provider if you have new or worsening symptoms. Take over-the-counter and prescription medicines only as told by your health care provider. Do not take aspirin, ibuprofen, or other NSAIDs unless your health care provider told you to do so. Keep all follow-up visits as told by your health care provider. This is important. This information is not intended to replace advice given to you by your health care provider. Make sure you discuss any questions you have with your health care provider. Document Revised: 08/14/2019 Document Reviewed: 08/14/2019 Elsevier Patient Education  2023 Elsevier Inc.  

## 2022-03-05 ENCOUNTER — Telehealth: Payer: Self-pay | Admitting: Family Medicine

## 2022-03-05 NOTE — Telephone Encounter (Signed)
Tired contacting patient to schedule their Medicare Annual Wellness Visit (AWV) to be completed by video or phone.   Last AWV: 01/06/2021  Please schedule at anytime with [place]     Any questions, please contact me at [phone]   Thank you,   [facility] Clinical Support for [place] [facility] ??[phone] or ??[phone]

## 2022-05-12 DIAGNOSIS — R030 Elevated blood-pressure reading, without diagnosis of hypertension: Secondary | ICD-10-CM | POA: Diagnosis not present

## 2022-05-12 DIAGNOSIS — Z6841 Body Mass Index (BMI) 40.0 and over, adult: Secondary | ICD-10-CM | POA: Diagnosis not present

## 2022-05-12 DIAGNOSIS — J209 Acute bronchitis, unspecified: Secondary | ICD-10-CM | POA: Diagnosis not present

## 2022-05-12 DIAGNOSIS — R062 Wheezing: Secondary | ICD-10-CM | POA: Diagnosis not present

## 2022-05-12 DIAGNOSIS — R69 Illness, unspecified: Secondary | ICD-10-CM | POA: Diagnosis not present

## 2022-05-22 ENCOUNTER — Telehealth: Payer: Self-pay | Admitting: Family Medicine

## 2022-05-22 DIAGNOSIS — J4540 Moderate persistent asthma, uncomplicated: Secondary | ICD-10-CM

## 2022-05-22 MED ORDER — ALBUTEROL SULFATE HFA 108 (90 BASE) MCG/ACT IN AERS: INHALATION_SPRAY | RESPIRATORY_TRACT | 3 refills | Status: DC

## 2022-05-22 MED ORDER — ALBUTEROL SULFATE HFA 108 (90 BASE) MCG/ACT IN AERS: INHALATION_SPRAY | RESPIRATORY_TRACT | 3 refills | Status: AC

## 2022-05-22 NOTE — Addendum Note (Signed)
Addended by: Julious Payer D on: 05/22/2022 03:00 PM   Modules accepted: Orders

## 2022-05-22 NOTE — Telephone Encounter (Signed)
LMOVM refill sent to pharmacy 

## 2022-05-22 NOTE — Telephone Encounter (Signed)
  Prescription Request  05/22/2022   What is the name of the medication or equipment? ALBUTEROL INHALER  Have you contacted your pharmacy to request a refill? YES  Which pharmacy would you like this sent to? [facility]

## 2022-06-08 ENCOUNTER — Telehealth: Payer: Self-pay | Admitting: Family Medicine

## 2022-06-08 NOTE — Telephone Encounter (Signed)
Called patient to schedule Medicare Annual Wellness Visit (AWV). Left message for patient to call back and schedule Medicare Annual Wellness Visit (AWV).  Last date of AWV: 01/06/2021   Please schedule an appointment at any time with either Laura or Courtney, [facility]'s. .  If any questions, please contact me at [phone].  Thank you,  Stephanie,  [facility] Clinical Support [facility] AWV Program Direct Dial ??[phone]   

## 2022-06-22 ENCOUNTER — Telehealth: Payer: Self-pay | Admitting: Family Medicine

## 2022-06-22 DIAGNOSIS — Z6841 Body Mass Index (BMI) 40.0 and over, adult: Secondary | ICD-10-CM | POA: Diagnosis not present

## 2022-06-22 DIAGNOSIS — Z881 Allergy status to other antibiotic agents status: Secondary | ICD-10-CM | POA: Diagnosis not present

## 2022-06-22 DIAGNOSIS — F341 Dysthymic disorder: Secondary | ICD-10-CM | POA: Diagnosis not present

## 2022-06-22 DIAGNOSIS — Z886 Allergy status to analgesic agent status: Secondary | ICD-10-CM | POA: Diagnosis not present

## 2022-06-22 DIAGNOSIS — Z888 Allergy status to other drugs, medicaments and biological substances status: Secondary | ICD-10-CM | POA: Diagnosis not present

## 2022-06-22 DIAGNOSIS — R21 Rash and other nonspecific skin eruption: Secondary | ICD-10-CM | POA: Diagnosis not present

## 2022-06-22 DIAGNOSIS — Z86718 Personal history of other venous thrombosis and embolism: Secondary | ICD-10-CM | POA: Diagnosis not present

## 2022-06-22 DIAGNOSIS — J45909 Unspecified asthma, uncomplicated: Secondary | ICD-10-CM | POA: Diagnosis not present

## 2022-06-22 DIAGNOSIS — F17210 Nicotine dependence, cigarettes, uncomplicated: Secondary | ICD-10-CM | POA: Diagnosis not present

## 2022-06-22 DIAGNOSIS — Z7951 Long term (current) use of inhaled steroids: Secondary | ICD-10-CM | POA: Diagnosis not present

## 2022-06-22 DIAGNOSIS — Z79899 Other long term (current) drug therapy: Secondary | ICD-10-CM | POA: Diagnosis not present

## 2022-06-22 DIAGNOSIS — K219 Gastro-esophageal reflux disease without esophagitis: Secondary | ICD-10-CM | POA: Diagnosis not present

## 2022-06-22 DIAGNOSIS — Z885 Allergy status to narcotic agent status: Secondary | ICD-10-CM | POA: Diagnosis not present

## 2022-06-22 NOTE — Telephone Encounter (Signed)
Contacted Lisa Simpson to schedule their annual wellness visit. Patient declined to schedule AWV at this time.  Patient is changing providers  Thank you,  Judeth Cornfield,  AMB Clinical Support [facility] AWV Program Direct Dial ??[phone]

## 2022-07-21 ENCOUNTER — Ambulatory Visit: Payer: Medicare HMO | Admitting: Family Medicine

## 2022-07-29 ENCOUNTER — Ambulatory Visit: Payer: Medicare HMO | Admitting: Family Medicine

## 2022-07-29 ENCOUNTER — Encounter: Payer: Self-pay | Admitting: General Practice

## 2022-09-02 ENCOUNTER — Encounter: Payer: Self-pay | Admitting: Nurse Practitioner

## 2022-09-02 ENCOUNTER — Ambulatory Visit: Payer: Medicare PPO | Admitting: Family Medicine

## 2022-09-02 DIAGNOSIS — E782 Mixed hyperlipidemia: Secondary | ICD-10-CM | POA: Diagnosis not present

## 2022-09-02 DIAGNOSIS — J4520 Mild intermittent asthma, uncomplicated: Secondary | ICD-10-CM | POA: Diagnosis not present

## 2022-09-02 DIAGNOSIS — Z131 Encounter for screening for diabetes mellitus: Secondary | ICD-10-CM | POA: Diagnosis not present

## 2022-09-02 DIAGNOSIS — R569 Unspecified convulsions: Secondary | ICD-10-CM | POA: Diagnosis not present

## 2022-09-02 DIAGNOSIS — Z5181 Encounter for therapeutic drug level monitoring: Secondary | ICD-10-CM | POA: Diagnosis not present

## 2022-09-02 DIAGNOSIS — Z6841 Body Mass Index (BMI) 40.0 and over, adult: Secondary | ICD-10-CM | POA: Diagnosis not present

## 2022-09-09 ENCOUNTER — Encounter: Payer: Medicare HMO | Admitting: Family Medicine

## 2022-09-23 DIAGNOSIS — H7012 Chronic mastoiditis, left ear: Secondary | ICD-10-CM | POA: Diagnosis not present

## 2022-09-23 DIAGNOSIS — Z6841 Body Mass Index (BMI) 40.0 and over, adult: Secondary | ICD-10-CM | POA: Diagnosis not present

## 2022-10-11 ENCOUNTER — Other Ambulatory Visit: Payer: Self-pay | Admitting: Family Medicine

## 2022-10-11 DIAGNOSIS — L509 Urticaria, unspecified: Secondary | ICD-10-CM

## 2022-11-19 DIAGNOSIS — J45998 Other asthma: Secondary | ICD-10-CM | POA: Diagnosis not present

## 2022-11-19 DIAGNOSIS — R059 Cough, unspecified: Secondary | ICD-10-CM | POA: Diagnosis not present

## 2022-11-19 DIAGNOSIS — Z6841 Body Mass Index (BMI) 40.0 and over, adult: Secondary | ICD-10-CM | POA: Diagnosis not present

## 2022-11-19 DIAGNOSIS — R030 Elevated blood-pressure reading, without diagnosis of hypertension: Secondary | ICD-10-CM | POA: Diagnosis not present

## 2022-11-19 DIAGNOSIS — R0981 Nasal congestion: Secondary | ICD-10-CM | POA: Diagnosis not present

## 2023-08-05 ENCOUNTER — Other Ambulatory Visit: Payer: Self-pay | Admitting: Nurse Practitioner

## 2023-08-05 DIAGNOSIS — J4540 Moderate persistent asthma, uncomplicated: Secondary | ICD-10-CM

## 2024-01-19 ENCOUNTER — Encounter: Payer: Self-pay | Admitting: Neurology

## 2024-03-16 ENCOUNTER — Encounter: Payer: Self-pay | Admitting: Neurology

## 2024-03-16 ENCOUNTER — Ambulatory Visit: Admitting: Neurology
